# Patient Record
Sex: Female | Born: 1940 | Race: White | Hispanic: No | Smoking: Former smoker
Health system: Southern US, Community
[De-identification: ages and names within clinical notes are randomized; demographics above are authoritative.]

## PROBLEM LIST (undated history)

## (undated) DIAGNOSIS — M1990 Unspecified osteoarthritis, unspecified site: Secondary | ICD-10-CM

## (undated) DIAGNOSIS — F32A Depression, unspecified: Secondary | ICD-10-CM

## (undated) DIAGNOSIS — Z973 Presence of spectacles and contact lenses: Secondary | ICD-10-CM

## (undated) DIAGNOSIS — F419 Anxiety disorder, unspecified: Secondary | ICD-10-CM

## (undated) DIAGNOSIS — M51379 Other intervertebral disc degeneration, lumbosacral region without mention of lumbar back pain or lower extremity pain: Secondary | ICD-10-CM

## (undated) DIAGNOSIS — C4490 Unspecified malignant neoplasm of skin, unspecified: Secondary | ICD-10-CM

## (undated) DIAGNOSIS — M8580 Other specified disorders of bone density and structure, unspecified site: Secondary | ICD-10-CM

## (undated) DIAGNOSIS — G8929 Other chronic pain: Secondary | ICD-10-CM

## (undated) DIAGNOSIS — F329 Major depressive disorder, single episode, unspecified: Secondary | ICD-10-CM

## (undated) DIAGNOSIS — T4145XA Adverse effect of unspecified anesthetic, initial encounter: Secondary | ICD-10-CM

## (undated) DIAGNOSIS — Z974 Presence of external hearing-aid: Secondary | ICD-10-CM

## (undated) DIAGNOSIS — K219 Gastro-esophageal reflux disease without esophagitis: Secondary | ICD-10-CM

## (undated) DIAGNOSIS — M545 Low back pain, unspecified: Secondary | ICD-10-CM

## (undated) DIAGNOSIS — D071 Carcinoma in situ of vulva: Secondary | ICD-10-CM

## (undated) DIAGNOSIS — I10 Essential (primary) hypertension: Secondary | ICD-10-CM

## (undated) DIAGNOSIS — T8859XA Other complications of anesthesia, initial encounter: Secondary | ICD-10-CM

## (undated) DIAGNOSIS — Z85828 Personal history of other malignant neoplasm of skin: Secondary | ICD-10-CM

## (undated) DIAGNOSIS — M5137 Other intervertebral disc degeneration, lumbosacral region: Secondary | ICD-10-CM

## (undated) DIAGNOSIS — M1611 Unilateral primary osteoarthritis, right hip: Secondary | ICD-10-CM

## (undated) DIAGNOSIS — M549 Dorsalgia, unspecified: Secondary | ICD-10-CM

## (undated) DIAGNOSIS — Z87442 Personal history of urinary calculi: Secondary | ICD-10-CM

## (undated) HISTORY — DX: Unspecified malignant neoplasm of skin, unspecified: C44.90

## (undated) HISTORY — DX: Carcinoma in situ of vulva: D07.1

## (undated) HISTORY — DX: Anxiety disorder, unspecified: F41.9

## (undated) HISTORY — PX: BREAST LUMPECTOMY: SHX2

## (undated) HISTORY — DX: Major depressive disorder, single episode, unspecified: F32.9

## (undated) HISTORY — DX: Unspecified osteoarthritis, unspecified site: M19.90

## (undated) HISTORY — DX: Unilateral primary osteoarthritis, right hip: M16.11

## (undated) HISTORY — DX: Depression, unspecified: F32.A

---

## 1959-10-31 HISTORY — PX: BREAST LUMPECTOMY: SHX2

## 1959-10-31 HISTORY — PX: BREAST EXCISIONAL BIOPSY: SUR124

## 1986-10-30 DIAGNOSIS — Z87442 Personal history of urinary calculi: Secondary | ICD-10-CM

## 1986-10-30 HISTORY — DX: Personal history of urinary calculi: Z87.442

## 1988-10-30 DIAGNOSIS — C4490 Unspecified malignant neoplasm of skin, unspecified: Secondary | ICD-10-CM

## 1988-10-30 HISTORY — DX: Unspecified malignant neoplasm of skin, unspecified: C44.90

## 1997-12-17 ENCOUNTER — Ambulatory Visit: Admission: RE | Admit: 1997-12-17 | Discharge: 1997-12-17 | Payer: Self-pay | Admitting: Internal Medicine

## 1998-08-13 ENCOUNTER — Ambulatory Visit (HOSPITAL_COMMUNITY): Admission: RE | Admit: 1998-08-13 | Discharge: 1998-08-13 | Payer: Self-pay | Admitting: Obstetrics and Gynecology

## 1999-12-26 ENCOUNTER — Ambulatory Visit (HOSPITAL_COMMUNITY): Admission: RE | Admit: 1999-12-26 | Discharge: 1999-12-26 | Payer: Self-pay | Admitting: Obstetrics and Gynecology

## 1999-12-26 ENCOUNTER — Encounter: Payer: Self-pay | Admitting: Obstetrics and Gynecology

## 1999-12-29 ENCOUNTER — Ambulatory Visit (HOSPITAL_COMMUNITY): Admission: RE | Admit: 1999-12-29 | Discharge: 1999-12-29 | Payer: Self-pay | Admitting: Obstetrics and Gynecology

## 1999-12-29 ENCOUNTER — Encounter: Payer: Self-pay | Admitting: Obstetrics and Gynecology

## 2001-02-15 ENCOUNTER — Encounter: Payer: Self-pay | Admitting: Obstetrics and Gynecology

## 2001-02-15 ENCOUNTER — Ambulatory Visit (HOSPITAL_COMMUNITY): Admission: RE | Admit: 2001-02-15 | Discharge: 2001-02-15 | Payer: Self-pay | Admitting: Obstetrics and Gynecology

## 2002-02-19 ENCOUNTER — Encounter: Payer: Self-pay | Admitting: Obstetrics and Gynecology

## 2002-02-19 ENCOUNTER — Ambulatory Visit (HOSPITAL_COMMUNITY): Admission: RE | Admit: 2002-02-19 | Discharge: 2002-02-19 | Payer: Self-pay | Admitting: Obstetrics and Gynecology

## 2003-05-26 ENCOUNTER — Ambulatory Visit (HOSPITAL_COMMUNITY): Admission: RE | Admit: 2003-05-26 | Discharge: 2003-05-26 | Payer: Self-pay | Admitting: Obstetrics and Gynecology

## 2003-05-26 ENCOUNTER — Encounter: Payer: Self-pay | Admitting: Obstetrics and Gynecology

## 2004-06-30 ENCOUNTER — Ambulatory Visit (HOSPITAL_COMMUNITY): Admission: RE | Admit: 2004-06-30 | Discharge: 2004-06-30 | Payer: Self-pay | Admitting: Obstetrics and Gynecology

## 2004-07-07 ENCOUNTER — Encounter: Admission: RE | Admit: 2004-07-07 | Discharge: 2004-07-07 | Payer: Self-pay | Admitting: Obstetrics and Gynecology

## 2004-07-13 ENCOUNTER — Encounter: Admission: RE | Admit: 2004-07-13 | Discharge: 2004-07-13 | Payer: Self-pay | Admitting: General Surgery

## 2004-11-03 ENCOUNTER — Ambulatory Visit: Payer: Self-pay | Admitting: Family Medicine

## 2005-01-26 ENCOUNTER — Ambulatory Visit (HOSPITAL_COMMUNITY): Admission: RE | Admit: 2005-01-26 | Discharge: 2005-01-26 | Payer: Self-pay | Admitting: Gastroenterology

## 2005-01-26 ENCOUNTER — Encounter (INDEPENDENT_AMBULATORY_CARE_PROVIDER_SITE_OTHER): Payer: Self-pay | Admitting: Specialist

## 2005-08-18 ENCOUNTER — Encounter: Admission: RE | Admit: 2005-08-18 | Discharge: 2005-08-18 | Payer: Self-pay | Admitting: Obstetrics and Gynecology

## 2005-09-29 ENCOUNTER — Ambulatory Visit: Payer: Self-pay | Admitting: Family Medicine

## 2005-10-06 ENCOUNTER — Ambulatory Visit: Payer: Self-pay | Admitting: Family Medicine

## 2006-08-23 ENCOUNTER — Encounter: Admission: RE | Admit: 2006-08-23 | Discharge: 2006-08-23 | Payer: Self-pay | Admitting: Obstetrics and Gynecology

## 2006-09-14 ENCOUNTER — Ambulatory Visit: Payer: Self-pay | Admitting: Internal Medicine

## 2006-10-15 ENCOUNTER — Ambulatory Visit: Payer: Self-pay | Admitting: Family Medicine

## 2006-10-15 LAB — CONVERTED CEMR LAB
Alkaline Phosphatase: 46 units/L (ref 39–117)
BUN: 12 mg/dL (ref 6–23)
Basophils Absolute: 0 10*3/uL (ref 0.0–0.1)
Cholesterol: 204 mg/dL (ref 0–200)
HCT: 39.3 % (ref 36.0–46.0)
HDL: 60.2 mg/dL (ref >39.0)
LDL DIRECT: 136.8 mg/dL
MCHC: 33.5 g/dL (ref 30.0–36.0)
MCV: 94.2 fL (ref 78.0–100.0)
Neutrophils Relative %: 48.9 % (ref 43.0–77.0)
RBC: 4.17 M/uL (ref 3.87–5.11)
TSH: 2.83 microintl units/mL (ref 0.35–5.50)
Triglyceride fasting, serum: 57 mg/dL (ref 0–149)

## 2006-10-16 ENCOUNTER — Ambulatory Visit: Payer: Self-pay | Admitting: Internal Medicine

## 2006-11-07 ENCOUNTER — Encounter: Admission: RE | Admit: 2006-11-07 | Discharge: 2006-11-07 | Payer: Self-pay | Admitting: Family Medicine

## 2007-02-18 ENCOUNTER — Ambulatory Visit: Payer: Self-pay | Admitting: Family Medicine

## 2007-02-18 ENCOUNTER — Encounter: Admission: RE | Admit: 2007-02-18 | Discharge: 2007-02-18 | Payer: Self-pay | Admitting: Family Medicine

## 2007-02-21 ENCOUNTER — Ambulatory Visit: Payer: Self-pay | Admitting: Family Medicine

## 2007-07-24 ENCOUNTER — Telehealth (INDEPENDENT_AMBULATORY_CARE_PROVIDER_SITE_OTHER): Payer: Self-pay | Admitting: *Deleted

## 2007-08-16 ENCOUNTER — Ambulatory Visit: Payer: Self-pay | Admitting: Family Medicine

## 2007-08-16 DIAGNOSIS — M1991 Primary osteoarthritis, unspecified site: Secondary | ICD-10-CM | POA: Insufficient documentation

## 2007-08-28 ENCOUNTER — Encounter: Admission: RE | Admit: 2007-08-28 | Discharge: 2007-08-28 | Payer: Self-pay | Admitting: Obstetrics and Gynecology

## 2007-10-29 ENCOUNTER — Ambulatory Visit: Payer: Self-pay | Admitting: Family Medicine

## 2007-10-29 DIAGNOSIS — F341 Dysthymic disorder: Secondary | ICD-10-CM | POA: Insufficient documentation

## 2007-10-29 DIAGNOSIS — L578 Other skin changes due to chronic exposure to nonionizing radiation: Secondary | ICD-10-CM | POA: Insufficient documentation

## 2008-08-28 ENCOUNTER — Encounter: Admission: RE | Admit: 2008-08-28 | Discharge: 2008-08-28 | Payer: Self-pay | Admitting: Obstetrics and Gynecology

## 2008-09-09 ENCOUNTER — Encounter: Admission: RE | Admit: 2008-09-09 | Discharge: 2008-09-09 | Payer: Self-pay | Admitting: Family Medicine

## 2008-10-30 HISTORY — PX: APPENDECTOMY: SHX54

## 2008-11-11 ENCOUNTER — Ambulatory Visit: Payer: Self-pay | Admitting: Internal Medicine

## 2008-11-11 ENCOUNTER — Encounter: Payer: Self-pay | Admitting: Family Medicine

## 2008-11-17 ENCOUNTER — Ambulatory Visit: Payer: Self-pay | Admitting: Family Medicine

## 2008-11-18 ENCOUNTER — Encounter: Payer: Self-pay | Admitting: *Deleted

## 2008-12-15 ENCOUNTER — Encounter (INDEPENDENT_AMBULATORY_CARE_PROVIDER_SITE_OTHER): Payer: Self-pay | Admitting: Surgery

## 2008-12-15 ENCOUNTER — Telehealth: Payer: Self-pay | Admitting: Family Medicine

## 2008-12-15 ENCOUNTER — Inpatient Hospital Stay (HOSPITAL_COMMUNITY): Admission: EM | Admit: 2008-12-15 | Discharge: 2008-12-22 | Payer: Self-pay | Admitting: Emergency Medicine

## 2008-12-15 ENCOUNTER — Ambulatory Visit: Payer: Self-pay | Admitting: Family Medicine

## 2008-12-15 DIAGNOSIS — Z87442 Personal history of urinary calculi: Secondary | ICD-10-CM | POA: Insufficient documentation

## 2008-12-15 DIAGNOSIS — R109 Unspecified abdominal pain: Secondary | ICD-10-CM | POA: Insufficient documentation

## 2008-12-15 DIAGNOSIS — N200 Calculus of kidney: Secondary | ICD-10-CM | POA: Insufficient documentation

## 2008-12-15 HISTORY — PX: LAPAROSCOPIC ABDOMINAL EXPLORATION: SHX6249

## 2008-12-15 LAB — CONVERTED CEMR LAB
Glucose, Urine, Semiquant: NEGATIVE
Ketones, urine, test strip: NEGATIVE
Nitrite: NEGATIVE
Specific Gravity, Urine: 1.015
pH: 6.5

## 2009-08-31 ENCOUNTER — Encounter: Admission: RE | Admit: 2009-08-31 | Discharge: 2009-08-31 | Payer: Self-pay | Admitting: Obstetrics and Gynecology

## 2010-09-01 ENCOUNTER — Encounter: Admission: RE | Admit: 2010-09-01 | Discharge: 2010-09-01 | Payer: Self-pay | Admitting: Geriatric Medicine

## 2010-11-20 ENCOUNTER — Encounter: Payer: Self-pay | Admitting: Obstetrics and Gynecology

## 2010-11-27 LAB — CONVERTED CEMR LAB
ALT: 20 units/L (ref 0–35)
Alkaline Phosphatase: 41 units/L (ref 39–117)
BUN: 12 mg/dL (ref 6–23)
Basophils Absolute: 0 10*3/uL (ref 0.0–0.1)
Basophils Relative: 0.7 % (ref 0.0–3.0)
Bilirubin Urine: NEGATIVE
Bilirubin Urine: NEGATIVE
Bilirubin, Direct: 0.1 mg/dL (ref 0.0–0.3)
Bilirubin, Direct: 0.2 mg/dL (ref 0.0–0.3)
Calcium: 8.9 mg/dL (ref 8.4–10.5)
Calcium: 9.1 mg/dL (ref 8.4–10.5)
Cholesterol: 217 mg/dL (ref 0–200)
Cholesterol: 240 mg/dL (ref 0–200)
Creatinine, Ser: 0.8 mg/dL (ref 0.4–1.2)
Direct LDL: 140.8 mg/dL
Eosinophils Absolute: 0.2 10*3/uL (ref 0.0–0.7)
Eosinophils Absolute: 0.3 10*3/uL (ref 0.0–0.6)
GFR calc Af Amer: 107 mL/min
GFR calc Af Amer: 91 mL/min
GFR calc non Af Amer: 76 mL/min
GFR calc non Af Amer: 88 mL/min
Glucose, Urine, Semiquant: NEGATIVE
HCT: 37.7 % (ref 36.0–46.0)
HDL: 58.7 mg/dL (ref >39.0)
Hemoglobin: 12.7 g/dL (ref 12.0–15.0)
Ketones, urine, test strip: NEGATIVE
Lymphocytes Relative: 38.2 % (ref 12.0–46.0)
Lymphocytes Relative: 40.8 % (ref 12.0–46.0)
MCHC: 33.8 g/dL (ref 30.0–36.0)
MCHC: 34.5 g/dL (ref 30.0–36.0)
MCV: 93.2 fL (ref 78.0–100.0)
MCV: 94.6 fL (ref 78.0–100.0)
Monocytes Absolute: 0.4 10*3/uL (ref 0.1–1.0)
Monocytes Relative: 6.6 % (ref 3.0–11.0)
Neutro Abs: 2.7 10*3/uL (ref 1.4–7.7)
Neutro Abs: 3 10*3/uL (ref 1.4–7.7)
Platelets: 199 10*3/uL (ref 150–400)
Protein, U semiquant: NEGATIVE
RDW: 12.9 % (ref 11.5–14.6)
Sodium: 142 meq/L (ref 135–145)
Specific Gravity, Urine: 1.01
Specific Gravity, Urine: 1.015
TSH: 2.09 microintl units/mL (ref 0.35–5.50)
TSH: 2.2 microintl units/mL (ref 0.35–5.50)
Total Bilirubin: 0.9 mg/dL (ref 0.3–1.2)
Urobilinogen, UA: 0.2
VLDL: 12 mg/dL (ref 0–40)
pH: 7.5
pH: 8

## 2011-02-14 LAB — CBC
HCT: 33.8 % — ABNORMAL LOW (ref 36.0–46.0)
HCT: 39.4 % (ref 36.0–46.0)
Hemoglobin: 11.4 g/dL — ABNORMAL LOW (ref 12.0–15.0)
Hemoglobin: 11.7 g/dL — ABNORMAL LOW (ref 12.0–15.0)
Hemoglobin: 13.1 g/dL (ref 12.0–15.0)
Platelets: 159 10*3/uL (ref 150–400)
RBC: 3.6 MIL/uL — ABNORMAL LOW (ref 3.87–5.11)
RBC: 4.21 MIL/uL (ref 3.87–5.11)
RDW: 13.5 % (ref 11.5–15.5)
WBC: 7.3 10*3/uL (ref 4.0–10.5)

## 2011-02-14 LAB — URINALYSIS, ROUTINE W REFLEX MICROSCOPIC
Hgb urine dipstick: NEGATIVE
Nitrite: NEGATIVE
Protein, ur: 30 mg/dL — AB
Specific Gravity, Urine: 1.025 (ref 1.005–1.030)
Urobilinogen, UA: 1 mg/dL (ref 0.0–1.0)

## 2011-02-14 LAB — BASIC METABOLIC PANEL
BUN: 9 mg/dL (ref 6–23)
Calcium: 8 mg/dL — ABNORMAL LOW (ref 8.4–10.5)
GFR calc non Af Amer: >60 mL/min (ref >60)
GFR calc non Af Amer: >60 mL/min (ref >60)
Glucose, Bld: 131 mg/dL — ABNORMAL HIGH (ref 70–99)
Potassium: 4.4 mEq/L (ref 3.5–5.1)
Sodium: 134 mEq/L — ABNORMAL LOW (ref 135–145)
Sodium: 139 mEq/L (ref 135–145)

## 2011-02-14 LAB — COMPREHENSIVE METABOLIC PANEL
ALT: 13 U/L (ref 0–35)
Alkaline Phosphatase: 53 U/L (ref 39–117)
BUN: 10 mg/dL (ref 6–23)
CO2: 26 mEq/L (ref 19–32)
Chloride: 100 mEq/L (ref 96–112)
GFR calc non Af Amer: >60 mL/min (ref >60)
Glucose, Bld: 155 mg/dL — ABNORMAL HIGH (ref 70–99)
Potassium: 3.7 mEq/L (ref 3.5–5.1)
Sodium: 136 mEq/L (ref 135–145)
Total Bilirubin: 1.2 mg/dL (ref 0.3–1.2)

## 2011-02-14 LAB — POCT I-STAT, CHEM 8
Creatinine, Ser: 0.8 mg/dL (ref 0.4–1.2)
HCT: 42 % (ref 36.0–46.0)
Hemoglobin: 14.3 g/dL (ref 12.0–15.0)
Sodium: 136 mEq/L (ref 135–145)
TCO2: 22 mmol/L (ref 0–100)

## 2011-02-14 LAB — URINE MICROSCOPIC-ADD ON

## 2011-02-14 LAB — DIFFERENTIAL
Basophils Absolute: 0.2 10*3/uL — ABNORMAL HIGH (ref 0.0–0.1)
Basophils Relative: 2 % — ABNORMAL HIGH (ref 0–1)
Eosinophils Absolute: 0 10*3/uL (ref 0.0–0.7)
Monocytes Relative: 4 % (ref 3–12)
Neutro Abs: 10.9 10*3/uL — ABNORMAL HIGH (ref 1.7–7.7)
Neutrophils Relative %: 90 % — ABNORMAL HIGH (ref 43–77)

## 2011-02-14 LAB — WOUND CULTURE

## 2011-02-14 LAB — ANAEROBIC CULTURE

## 2011-02-14 LAB — LIPASE, BLOOD: Lipase: 15 U/L (ref 11–59)

## 2011-03-14 NOTE — H&P (Signed)
NAMEJANETTA, [name]                 ACCOUNT NO.:  [id number]   MEDICAL RECORD NO.:  [id number]          PATIENT TYPE:  EMS   LOCATION:  ED                           FACILITY:  [facility]   PHYSICIAN:  Velora Heckler, MD      DATE OF BIRTH:  07/14/1941   DATE OF ADMISSION:  12/15/2008  DATE OF DISCHARGE:                              HISTORY & PHYSICAL   REFERRING PHYSICIAN:  Dr. Cheri Guppy.   PRIMARY CARE PHYSICIAN:  Dr. Alonza Smoker, [facility].   CHIEF COMPLAINT:  Abdominal pain, nausea, vomiting.   HISTORY OF PRESENT ILLNESS:  The patient is a 70 year old white female  from [place], [place].  She presents to the emergency  department with a 24-hour history of abdominal pain.  Today she  developed nausea, vomiting and chills.  The patient was seen at her  primary physician's office and diagnosed with kidney stone.  The patient  went home after being given pain medication.  Nausea and vomiting  occurred and the patient was brought by friends to the emergency  department at [facility] for evaluation.  Laboratory  studies showed an elevated white blood cell count of 12,000 with a left  shift.  CT scan of the abdomen and pelvis was obtained and demonstrated  an acute inflammatory process in the right lower quadrant and into the  pelvis.  There was a moderate amount of free fluid.  There were air  bubbles consistent with probable perforated acute appendicitis with  peritonitis.  General surgery is called for management.   PAST MEDICAL HISTORY:  History of nephrolithiasis, history of  degenerative joint disease.   MEDICATIONS:  Prempro and multivitamins.   ALLERGIES:  DEMEROL causing nausea and vomiting.   FAMILY HISTORY:  Noncontributory.  No adverse events with anesthesia  known.   SOCIAL HISTORY:  The patient lives alone in [place].  She is  accompanied by two friends.  She quit smoking in 1987.  She drinks wine  daily.   REVIEW OF  SYSTEMS:  A 15-system review otherwise without significant  findings.   EXAM:  CONSTITUTIONAL:  A 70 year old, bright alert white female on a  stretcher in the emergency department.  VITAL SIGNS:  Temperature 98.7, pulse 80, respirations 18, blood  pressure 99/59.  Palpation of the skin, however, shows her to be  febrile.  HEENT:  Shows her to be normocephalic, atraumatic.  Sclerae clear.  Conjunctiva clear.  Dentition fair.  Mucous membranes moist.  NECK:  Palpation of the neck shows no thyroid nodularity.  No  lymphadenopathy.  No mass.  No tenderness.  LUNGS:  Clear to auscultation bilaterally.  CARDIAC:  Exam shows regular rate and rhythm without significant murmur.  Peripheral pulses are full.  EXTREMITIES:  Nontender without significant edema.  ABDOMEN:  Soft without distention.  Few bowel sounds were present on  auscultation.  There is a small incarcerated umbilical hernia which is  asymptomatic.  There are no other surgical wounds.  Palpation reveals an  area of mass in the right lower quadrant.  There is  voluntary  guarding.  There is rebound tenderness.  This area is exquisitely tender  to palpation.  No hepatosplenomegaly.  No evidence of hernia.  NEUROLOGICALLY:  The patient is alert and oriented without focal  deficit.  There is no sign of tremor.   LABORATORY STUDIES:  White count 12.0, hemoglobin 13.1, platelets  181,000.  Differential shows 90% segmented neutrophils.  Electrolytes  are normal.  Urinalysis is benign.   RADIOGRAPHIC STUDIES:  CT scan of the abdomen and pelvis is reviewed.  This demonstrates an acute inflammatory process in the right lower  quadrant of the abdomen and the right side of the pelvis.  There is a  moderate amount of fluid.  There are some air bubbles.  This is felt by  the radiologist to be most consistent with acute perforated appendicitis  versus Crohn's disease.   IMPRESSION:  Acute appendicitis with perforation and generalized   peritonitis.   PLAN:  The patient will be admitted on the general surgical service.  Intravenous antibiotics have been initiated by the emergency room  physician.  The patient will be prepared and taken to the operating room  this evening for laparoscopy, possible laparoscopic appendectomy, and  possible laparotomy.  Risk and benefits of the procedure are discussed  with the patient and her friends.  I explained that the inflammatory  process looks rather extensive on CT scan and may require an open  surgical procedure.  I also discussed with them the expected hospital  stay of 5 to 7 days for intravenous antibiotics.  I also explained to  them the possibility of recurrent infection in 7 to 14 days due to  perforation and generalized peritonitis with the possibility of delayed  abscess formation.  They understand and agree to proceed to the  operating room this evening.  I will make arrangements with the  operating room staff.      Velora Heckler, MD  Electronically Signed     TMG/MEDQ  D:  12/15/2008  T:  12/15/2008  Job:  [id number]   cc:   Tinnie Gens A. Tawanna Cooler, MD  [address] [place]  [place] [address]   Hassan Buckler. Weldon Inches, MD  Fax: [phone]

## 2011-03-14 NOTE — Op Note (Signed)
[name], [name]                 ACCOUNT NO.:  [id number]   MEDICAL RECORD NO.:  [id number]          PATIENT TYPE:  INP   LOCATION:  1235                         FACILITY:  [facility]   PHYSICIAN:  Velora Heckler, MD      DATE OF BIRTH:  1941/04/23   DATE OF PROCEDURE:  12/15/2008  DATE OF DISCHARGE:                               OPERATIVE REPORT   PREOPERATIVE DIAGNOSIS:  Perforated acute appendicitis.   POSTOPERATIVE DIAGNOSES:  Perforated acute appendicitis.   PROCEDURE:  1. Diagnostic laparoscopy.  2. Exploratory laparotomy.  3. Open appendectomy.   SURGEON  Velora Heckler, MD, FACS   ANESTHESIA:  General per Dr. Ronelle Nigh.   ESTIMATED BLOOD LOSS:  Minimal.   PREPARATION:  Betadine.   COMPLICATIONS:  None.   INDICATIONS:  The patient is a 70 year old white female who presented to  the emergency department with 24-hour history of lower abdominal pain,  nausea, vomiting and chills.  White blood cell count was elevated at  12,000.  CT scan abdomen and pelvis demonstrated findings consistent  with acute appendicitis.  General surgery was consulted.  The patient  was admitted on the general surgical service and prepared for the  operating room.   DESCRIPTION OF PROCEDURE:  The procedure was done in OR #1 at [facility].  The patient is brought to the operating room,  placed in supine position on the operating room table.  Following  administration of general anesthesia the patient is prepped and draped  in usual strict aseptic fashion.  After ascertaining that an adequate  level of anesthesia had been achieved, a supraumbilical incision is made  in the midline.  Dissection was carried down to the fascia.  Fascia was  incised in the midline.  The peritoneal cavity is entered cautiously.  Zero Vicryl pursestring suture was placed in the fascia.  An Hasson  cannula was introduced under direct vision and secured with a  pursestring suture.  Abdomen was  insufflated with carbon dioxide.  Laparoscope was introduced and the abdomen explored.  There is cloudy  brown green purulent fluid throughout the peritoneal cavity,  particularly in the pelvis.  There are acute inflammatory changes in the  right lower quadrant.  There were multiple loops of small bowel adherent  to the cecum and the abdominal wall.  An operating port was placed in  the right upper quadrant with a 5 mm trocar.  A Glassman clamp was used  to mobilize the bowel away from the inflammatory process.  Air bubbles  were seen rising through the purulent fluid.  Appendix was not able to  be visualized.  Decision was made at this point to convert to open  procedure.   The field is reset.  Lower midline incision is made with a #15 blade  extending from just above the pubis to the supraumbilical incision above  the umbilicus.  Dissection is carried through subcutaneous tissues with  electrocautery.  Fascia is incised in the midline and the peritoneal  cavity is entered cautiously.  Purulent fluid is evacuated from the  abdomen with a pool sucker.  Balfour retractors placed for exposure.  With gentle mobilization of the cecum, the cecum was mobilized along its  lateral peritoneal attachments with the harmonic scalpel and the  electrocautery.  The appendix is adherent to the base of the terminal  ileum mesentery.  Appendix was bluntly dissected free from the  mesentery.  Mesoappendix was divided with the harmonic scalpel down to  the base of the appendix.  Base of the appendix was relatively normal at  its junction with the cecal wall.  The base of the appendix is closed  with a TA-30 stapler and the appendix was excised and submitted to  pathology for review.  Aerobic and anaerobic cultures were obtained.  Abdomen was copiously irrigated with warm saline which was evacuated  until clear.  Good hemostasis was noted.  Viscera returned to their  normal locations and covered with  omentum.  The midline surgical wound  is closed with interrupted #1 Vicryl interrupted sutures.  Subcutaneous  tissues irrigated.  Skin incisions were closed with stainless steel  staples.  Sterile dressings were applied.  The patient is awakened from  anesthesia and brought to the recovery room in stable condition.  The  patient tolerated the procedure well.      Velora Heckler, MD  Electronically Signed     TMG/MEDQ  D:  12/15/2008  T:  12/16/2008  Job:  [id number]   cc:   Tinnie Gens A. Tawanna Cooler, MD  [address] [place]  [place] [address]   Hassan Buckler. Weldon Inches, MD  Fax: [phone]

## 2011-03-14 NOTE — Discharge Summary (Signed)
[name], [name]                 ACCOUNT NO.:  [id number]   MEDICAL RECORD NO.:  [id number]          PATIENT TYPE:  INP   LOCATION:  1535                         FACILITY:  [facility]   PHYSICIAN:  Joy Heckler, MD      DATE OF BIRTH:  12/09/1940   DATE OF ADMISSION:  12/15/2008  DATE OF DISCHARGE:  12/22/2008                               DISCHARGE SUMMARY   REASON FOR ADMISSION:  Perforated appendicitis.   HISTORY OF PRESENT ILLNESS:  The patient is a 70 year old white female  from [place], [place].  She presented to the emergency  department with a 24-hour history of abdominal pain, nausea, vomiting  and chills.  Evaluation included laboratory studies showing a white  blood cell count of 12,000.  CT scan of the abdomen and pelvis showed  moderate free fluid and inflammatory changes around the appendix  consistent with perforated appendicitis.   HOSPITAL COURSE:  The patient was seen and evaluated in the emergency  department.  She was admitted to the general surgical service.  She was  prepared immediately and brought to the operating room where she  underwent diagnostic laparoscopy and open appendectomy.  Postoperatively, she received intravenous antibiotics.  She had gradual  resolution of her ileus.  Postoperative course was complicated with  development of a subcutaneous wound infection which required removal of  a portion of the skin closure and open packing of the wound with  dressing changes.  The patient received 7 days of intravenous  antibiotics with Invanz.  She she is prepared for discharge home on the  seventh postoperative day.   DISCHARGE/PLAN:  The patient is discharged home today December 22, 2008,  in good condition, tolerating a regular diet, ambulating independently.  The patient will be seen back in my office at [facility]  in 3 days for wound check.  [facility] nurses will  assist with twice-daily dressing  changes.   DISCHARGE MEDICATIONS:  1. Vicodin for pain.  2. Augmentin 875 mg b.i.d. for one more week.   FINAL DIAGNOSES:  1. Perforated acute appendicitis.  2. Generalized peritonitis.  3. Wound infection.   CONDITION AT DISCHARGE:  Improved.      Joy Heckler, MD  Electronically Signed     TMG/MEDQ  D:  12/22/2008  T:  12/22/2008  Job:  [id number]   cc:   Joy Gens A. Tawanna Cooler, MD  [address] [place]  [place] [address]

## 2011-03-17 NOTE — Op Note (Signed)
NAMENEIDRA, GIRVAN                 ACCOUNT NO.:  [id number]   MEDICAL RECORD NO.:  [id number]          PATIENT TYPE:  AMB   LOCATION:  ENDO                         FACILITY:  [facility]   PHYSICIAN:  Danise Edge, M.D.   DATE OF BIRTH:  03-22-1941   DATE OF PROCEDURE:  01/26/2005  DATE OF DISCHARGE:                                 OPERATIVE REPORT   PROCEDURE:  Colonoscopy and polypectomy.   INDICATIONS FOR PROCEDURE:  Ms. Fredrika Canby is a 70 year old female born  June 06, 1941.  Ms. Dumais underwent a colonoscopy approximately 12 years  ago.  She is scheduled to undergo a screening colonoscopy with polypectomy  to prevent colon cancer.   ENDOSCOPIST:  Danise Edge, M.D.   PREMEDICATION:  Versed 7.5 mg, Demerol 60 mg.   DESCRIPTION OF PROCEDURE:  After obtaining informed consent, Ms. Caywood was  placed in the left lateral decubitus position.  I administered intravenous  Demerol and intravenous Versed to achieve conscious sedation for the  procedure.  The patient's blood pressure, oxygen saturation, and cardiac  rhythm were monitored throughout the procedure and documented in the medical  record.   Anal inspection and digital rectal exam were normal.  The Olympus adjustable  pediatric colonoscope was introduced into the rectum and advanced to the  cecum.  The colonic preparation for the exam today was excellent.   Rectum:  Normal.   Sigmoid colon and descending colon:  Normal.   Splenic flexure:  Normal.   Transverse colon:  Normal.   Hepatic flexure:  Normal.   Ascending colon:  From the proximal ascending colon, a 2-mm sessile polyp  was removed with the electrocautery snare.   Cecum and ileocecal valve:  Normal.   ASSESSMENT:  A small polyp was removed from the proximal ascending colon;  otherwise, Ms. Burridge's proctocolonoscopy to the cecum was normal.      MJ/MEDQ  D:  01/26/2005  T:  01/26/2005  Job:  [id number]   cc:   Tinnie Gens A. Tawanna Cooler, M.D. [facility]

## 2011-07-25 ENCOUNTER — Encounter: Payer: Self-pay | Admitting: Gynecology

## 2011-07-25 ENCOUNTER — Ambulatory Visit (INDEPENDENT_AMBULATORY_CARE_PROVIDER_SITE_OTHER): Payer: Medicare Other | Admitting: Gynecology

## 2011-07-25 VITALS — BP 120/76 | Ht 66.0 in | Wt 152.0 lb

## 2011-07-25 DIAGNOSIS — C801 Malignant (primary) neoplasm, unspecified: Secondary | ICD-10-CM | POA: Insufficient documentation

## 2011-07-25 DIAGNOSIS — F32A Depression, unspecified: Secondary | ICD-10-CM | POA: Insufficient documentation

## 2011-07-25 DIAGNOSIS — F419 Anxiety disorder, unspecified: Secondary | ICD-10-CM | POA: Insufficient documentation

## 2011-07-25 DIAGNOSIS — Z79890 Hormone replacement therapy (postmenopausal): Secondary | ICD-10-CM

## 2011-07-25 DIAGNOSIS — F329 Major depressive disorder, single episode, unspecified: Secondary | ICD-10-CM | POA: Insufficient documentation

## 2011-07-25 DIAGNOSIS — M1990 Unspecified osteoarthritis, unspecified site: Secondary | ICD-10-CM | POA: Insufficient documentation

## 2011-07-25 DIAGNOSIS — N952 Postmenopausal atrophic vaginitis: Secondary | ICD-10-CM

## 2011-07-25 DIAGNOSIS — N951 Menopausal and female climacteric states: Secondary | ICD-10-CM

## 2011-07-25 MED ORDER — CONJ ESTROG-MEDROXYPROGEST ACE 0.45-1.5 MG PO TABS: 1.0000 | ORAL_TABLET | Freq: Every day | ORAL | Status: DC

## 2011-07-25 NOTE — Progress Notes (Signed)
Sheniya Garciaperez [facility] [birth year] [id number]        70 y.o.  for followup.  Former patient of Dr. Leota Sauers. She is on HRT of Prempro 0.45-1.5 doing well with this. She states that she has tried weaning off of it but has a general feeling of well-being when she is on it.  Past medical history,surgical history, medications, allergies, family history and social history were all reviewed and documented in the EPIC chart. ROS:  Was performed and pertinent positives and negatives are included in the history.  Exam: chaperone present Filed Vitals:   07/25/11 0900  BP: 120/76   General appearance  Normal Skin grossly normal Head/Neck normal with no cervical or supraclavicular adenopathy thyroid normal Lungs  clear Cardiac RR, without RMG Abdominal  soft, nontender, without masses, organomegaly or hernia Breasts  examined lying and sitting without masses, retractions, discharge or axillary adenopathy. Pelvic  Ext/BUS/vagina  normal atrophic genital changes noted  Cervix  normal  Pap not done  Uterus  anteverted, normal size, shape and contour, midline and mobile nontender   Adnexa  Without masses or tenderness    Anus and perineum  normal   Rectovaginal  normal sphincter tone without palpated masses or tenderness.    Assessment/Plan:  70 y.o. female for annual exam.    1. HRT. Patient has been on HRT for a number of years. In fact she was part of the WHI study group. She initially tried transdermal but had a lot of skin irritation. I reviewed the issues of HRT. I discussed the WHI study, increased risk of stroke heart attack DVT and breast cancer, ACOG and NAMS recommendations for lowest dose for shortest period of time, transdermal benefits as far as first pass effect decreased thrombosis risk and the issues of synthetic hormones versus natural hormones all reviewed. At this point the patient prefers to continue on Prempro 0. 45-1.5. She understands the risks accepts them and I refilled her times a  year. She is thinking of switching to estradiol and Prometrium but she will let me know after she reads more about it and she wants to do so. 2. Health maintenance. Patient sees a primary for annual general health. No blood work was done today. She is due for mammography in November I reminded her to schedule this. Self breast exams on a monthly basis was discussed. No Pap smear was done today. She has no history of significant abnormal Pap smears and has had annual regular Pap smears over the past several years. I reviewed current screening guidelines and she is 74 and she agrees with stopping Pap smears at this point.  She had a colonoscopy and a bone density last year per her history and will continue to follow up through her primary for these screening procedures. Assuming she continues well from a gynecologic standpoint she will see [place] in a year sooner as needed    Dara Lords MD, 10:19 AM 07/25/2011

## 2011-07-25 NOTE — Patient Instructions (Signed)
Per our discussion.

## 2011-08-07 ENCOUNTER — Other Ambulatory Visit: Payer: Self-pay | Admitting: Geriatric Medicine

## 2011-08-07 DIAGNOSIS — Z1231 Encounter for screening mammogram for malignant neoplasm of breast: Secondary | ICD-10-CM

## 2011-08-24 ENCOUNTER — Other Ambulatory Visit: Payer: Self-pay | Admitting: Dermatology

## 2011-09-13 ENCOUNTER — Ambulatory Visit
Admission: RE | Admit: 2011-09-13 | Discharge: 2011-09-13 | Disposition: A | Discharge status: Home / Self Care | Payer: Medicare Other | Source: Ambulatory Visit | Attending: Geriatric Medicine | Admitting: Geriatric Medicine

## 2011-09-13 DIAGNOSIS — Z1231 Encounter for screening mammogram for malignant neoplasm of breast: Secondary | ICD-10-CM

## 2012-02-29 ENCOUNTER — Other Ambulatory Visit (HOSPITAL_COMMUNITY): Payer: Self-pay | Admitting: Orthopaedic Surgery

## 2012-04-09 ENCOUNTER — Encounter (HOSPITAL_COMMUNITY): Payer: Self-pay | Admitting: Pharmacy Technician

## 2012-04-15 ENCOUNTER — Encounter (HOSPITAL_COMMUNITY)
Admission: RE | Admit: 2012-04-15 | Discharge: 2012-04-15 | Disposition: A | Discharge status: Home / Self Care | Payer: Medicare Other | Source: Ambulatory Visit | Attending: Orthopaedic Surgery | Admitting: Orthopaedic Surgery

## 2012-04-15 ENCOUNTER — Encounter (HOSPITAL_COMMUNITY): Payer: Self-pay

## 2012-04-15 HISTORY — DX: Other specified disorders of bone density and structure, unspecified site: M85.80

## 2012-04-15 LAB — URINALYSIS, ROUTINE W REFLEX MICROSCOPIC
Glucose, UA: NEGATIVE mg/dL
Hgb urine dipstick: NEGATIVE
Protein, ur: NEGATIVE mg/dL
pH: 7 (ref 5.0–8.0)

## 2012-04-15 LAB — BASIC METABOLIC PANEL
BUN: 13 mg/dL (ref 6–23)
Chloride: 103 mEq/L (ref 96–112)
GFR calc Af Amer: >90 mL/min (ref >90)
GFR calc non Af Amer: 83 mL/min — ABNORMAL LOW (ref >90)
Glucose, Bld: 94 mg/dL (ref 70–99)
Potassium: 4.4 mEq/L (ref 3.5–5.1)
Sodium: 139 mEq/L (ref 135–145)

## 2012-04-15 LAB — URINE MICROSCOPIC-ADD ON

## 2012-04-15 LAB — CBC
HCT: 37.8 % (ref 36.0–46.0)
Hemoglobin: 12.4 g/dL (ref 12.0–15.0)
MCH: 31 pg (ref 26.0–34.0)
MCHC: 32.8 g/dL (ref 30.0–36.0)
RBC: 4 MIL/uL (ref 3.87–5.11)

## 2012-04-15 LAB — PROTIME-INR: Prothrombin Time: 12.7 seconds (ref 11.6–15.2)

## 2012-04-15 LAB — SURGICAL PCR SCREEN: MRSA, PCR: NEGATIVE

## 2012-04-15 NOTE — Pre-Procedure Instructions (Signed)
I spoke to [name] at office concerning abnormal UA, she will let  Dr. Magnus Ivan know to review.

## 2012-04-15 NOTE — Patient Instructions (Signed)
[name]  04/15/2012   Your procedure is scheduled on:  04/19/12  Report to SHORT STAY DEPT  at 12:15 PM.  Call this number if you have problems the morning of surgery: [phone]   Remember:   Do not eat food or drink liquids AFTER MIDNIGHT  May have clear liquids UNTIL 6 HOURS BEFORE SURGERY (8:45 AM)  Clear liquids include soda, tea, black coffee, apple or grape juice, broth.  Take these medicines the morning of surgery with A SIP OF WATER:  MAY USE EYE DROPS   Do not wear jewelry, make-up or nail polish.  Do not wear lotions, powders, or perfumes.   Do not shave legs or underarms 48 hrs. before surgery (men may shave face)  Do not bring valuables to the hospital.  Contacts, dentures or bridgework may not be worn into surgery.  Leave suitcase in the car. After surgery it may be brought to your room.  For patients admitted to the hospital, checkout time is 11:00 AM the day of discharge.   Patients discharged the day of surgery will not be allowed to drive home.    Special Instructions:   Please read over the following fact sheets that you were given: MRSA  Information               SHOWER WITH BETASEPT THE NIGHT BEFORE SURGERY AND THE MORNING OF SURGERY

## 2012-04-19 ENCOUNTER — Encounter (HOSPITAL_COMMUNITY): Payer: Self-pay | Admitting: *Deleted

## 2012-04-19 ENCOUNTER — Encounter (HOSPITAL_COMMUNITY): Payer: Self-pay | Admitting: Anesthesiology

## 2012-04-19 ENCOUNTER — Ambulatory Visit (HOSPITAL_COMMUNITY): Payer: Medicare Other

## 2012-04-19 ENCOUNTER — Encounter (HOSPITAL_COMMUNITY)
Admission: RE | Disposition: A | Discharge status: Home Health | Payer: Self-pay | Source: Ambulatory Visit | Attending: Orthopaedic Surgery

## 2012-04-19 ENCOUNTER — Inpatient Hospital Stay (HOSPITAL_COMMUNITY)
Admission: RE | Admit: 2012-04-19 | Discharge: 2012-04-22 | DRG: 470 | Disposition: A | Discharge status: Home Health | Payer: Medicare Other | Source: Ambulatory Visit | Attending: Orthopaedic Surgery | Admitting: Orthopaedic Surgery

## 2012-04-19 ENCOUNTER — Inpatient Hospital Stay (HOSPITAL_COMMUNITY): Payer: Medicare Other

## 2012-04-19 ENCOUNTER — Ambulatory Visit (HOSPITAL_COMMUNITY): Payer: Medicare Other | Admitting: Anesthesiology

## 2012-04-19 DIAGNOSIS — Z79890 Hormone replacement therapy (postmenopausal): Secondary | ICD-10-CM

## 2012-04-19 DIAGNOSIS — F411 Generalized anxiety disorder: Secondary | ICD-10-CM | POA: Diagnosis present

## 2012-04-19 DIAGNOSIS — M1610 Unilateral primary osteoarthritis, unspecified hip: Principal | ICD-10-CM | POA: Diagnosis present

## 2012-04-19 DIAGNOSIS — F329 Major depressive disorder, single episode, unspecified: Secondary | ICD-10-CM | POA: Diagnosis present

## 2012-04-19 DIAGNOSIS — F3289 Other specified depressive episodes: Secondary | ICD-10-CM | POA: Diagnosis present

## 2012-04-19 DIAGNOSIS — M169 Osteoarthritis of hip, unspecified: Secondary | ICD-10-CM

## 2012-04-19 HISTORY — PX: TOTAL HIP ARTHROPLASTY: SHX124

## 2012-04-19 SURGERY — ARTHROPLASTY, HIP, TOTAL, ANTERIOR APPROACH
Anesthesia: General | Site: Hip | Laterality: Left | Wound class: Clean

## 2012-04-19 MED ORDER — NALOXONE HCL 0.4 MG/ML IJ SOLN: 0.4000 mg | INTRAMUSCULAR | Status: DC | PRN

## 2012-04-19 MED ORDER — ONDANSETRON HCL 4 MG PO TABS: 4.0000 mg | ORAL_TABLET | Freq: Four times a day (QID) | ORAL | Status: DC | PRN

## 2012-04-19 MED ORDER — DIPHENHYDRAMINE HCL 12.5 MG/5ML PO ELIX: 12.5000 mg | ORAL_SOLUTION | Freq: Four times a day (QID) | ORAL | Status: DC | PRN

## 2012-04-19 MED ORDER — ACETAMINOPHEN 325 MG PO TABS: 650.0000 mg | ORAL_TABLET | Freq: Four times a day (QID) | ORAL | Status: DC | PRN

## 2012-04-19 MED ORDER — CITALOPRAM HYDROBROMIDE 20 MG PO TABS
20.0000 mg | ORAL_TABLET | ORAL | Status: DC
  Filled 2012-04-19: qty 1

## 2012-04-19 MED ORDER — ACETAMINOPHEN 10 MG/ML IV SOLN
INTRAVENOUS | Status: DC | PRN
  Administered 2012-04-19: 1000 mg via INTRAVENOUS

## 2012-04-19 MED ORDER — MIDAZOLAM HCL 5 MG/5ML IJ SOLN
INTRAMUSCULAR | Status: DC | PRN
  Administered 2012-04-19: 1 mg via INTRAVENOUS

## 2012-04-19 MED ORDER — LIDOCAINE HCL (CARDIAC) 20 MG/ML IV SOLN
INTRAVENOUS | Status: DC | PRN
  Administered 2012-04-19: 50 mg via INTRAVENOUS

## 2012-04-19 MED ORDER — MORPHINE SULFATE (PF) 1 MG/ML IV SOLN
INTRAVENOUS | Status: DC
  Administered 2012-04-19: 20 mg via INTRAVENOUS
  Administered 2012-04-19 (×2): via INTRAVENOUS
  Administered 2012-04-20: 3 mg via INTRAVENOUS
  Administered 2012-04-20: 2 mg via INTRAVENOUS
  Administered 2012-04-20: 9 mg via INTRAVENOUS
  Administered 2012-04-20: 5 mg via INTRAVENOUS
  Administered 2012-04-21: 3 mg via INTRAVENOUS
  Administered 2012-04-21: 2 mg via INTRAVENOUS
  Administered 2012-04-21: 8.39 mg via INTRAVENOUS
  Administered 2012-04-21: 06:00:00 via INTRAVENOUS
  Administered 2012-04-21: 10 mg via INTRAVENOUS
  Filled 2012-04-19 (×3): qty 25

## 2012-04-19 MED ORDER — ADULT MULTIVITAMIN W/MINERALS CH
1.0000 | ORAL_TABLET | Freq: Every day | ORAL | Status: DC
  Administered 2012-04-20 – 2012-04-22 (×3): 1 via ORAL
  Filled 2012-04-19 (×3): qty 1

## 2012-04-19 MED ORDER — SODIUM CHLORIDE 0.9 % IV SOLN
INTRAVENOUS | Status: DC
  Administered 2012-04-19 – 2012-04-20 (×2): via INTRAVENOUS

## 2012-04-19 MED ORDER — ONDANSETRON HCL 4 MG/2ML IJ SOLN
4.0000 mg | Freq: Four times a day (QID) | INTRAMUSCULAR | Status: DC | PRN
  Administered 2012-04-19 – 2012-04-21 (×3): 4 mg via INTRAVENOUS
  Filled 2012-04-19 (×3): qty 2

## 2012-04-19 MED ORDER — METOCLOPRAMIDE HCL 10 MG PO TABS: 5.0000 mg | ORAL_TABLET | Freq: Three times a day (TID) | ORAL | Status: DC | PRN

## 2012-04-19 MED ORDER — KETOROLAC TROMETHAMINE 15 MG/ML IJ SOLN
7.5000 mg | Freq: Four times a day (QID) | INTRAMUSCULAR | Status: AC
  Administered 2012-04-19 – 2012-04-20 (×4): 7.5 mg via INTRAVENOUS
  Filled 2012-04-19 (×3): qty 1

## 2012-04-19 MED ORDER — HYDROMORPHONE HCL PF 1 MG/ML IJ SOLN
INTRAMUSCULAR | Status: AC
  Filled 2012-04-19: qty 1

## 2012-04-19 MED ORDER — LACTATED RINGERS IV SOLN
INTRAVENOUS | Status: DC
  Administered 2012-04-19: 1000 mL via INTRAVENOUS

## 2012-04-19 MED ORDER — 0.9 % SODIUM CHLORIDE (POUR BTL) OPTIME
TOPICAL | Status: DC | PRN
  Administered 2012-04-19: 1000 mL

## 2012-04-19 MED ORDER — ALUM & MAG HYDROXIDE-SIMETH 200-200-20 MG/5ML PO SUSP: 30.0000 mL | ORAL | Status: DC | PRN

## 2012-04-19 MED ORDER — MORPHINE SULFATE 2 MG/ML IJ SOLN: 2.0000 mg | INTRAMUSCULAR | Status: DC | PRN

## 2012-04-19 MED ORDER — ROCURONIUM BROMIDE 100 MG/10ML IV SOLN
INTRAVENOUS | Status: DC | PRN
  Administered 2012-04-19: 35 mg via INTRAVENOUS
  Administered 2012-04-19: 15 mg via INTRAVENOUS

## 2012-04-19 MED ORDER — OXYCODONE HCL 5 MG PO TABS
5.0000 mg | ORAL_TABLET | ORAL | Status: DC | PRN
  Administered 2012-04-21 – 2012-04-22 (×4): 5 mg via ORAL
  Administered 2012-04-22: 10 mg via ORAL
  Filled 2012-04-19 (×2): qty 2
  Filled 2012-04-19: qty 1
  Filled 2012-04-19: qty 2
  Filled 2012-04-19: qty 1

## 2012-04-19 MED ORDER — ONDANSETRON HCL 4 MG/2ML IJ SOLN
INTRAMUSCULAR | Status: DC | PRN
  Administered 2012-04-19: 4 mg via INTRAVENOUS

## 2012-04-19 MED ORDER — KETOROLAC TROMETHAMINE 15 MG/ML IJ SOLN
INTRAMUSCULAR | Status: AC
  Administered 2012-04-20: 7.5 mg via INTRAVENOUS
  Filled 2012-04-19: qty 1

## 2012-04-19 MED ORDER — CITALOPRAM HYDROBROMIDE 40 MG PO TABS: 40.0000 mg | ORAL_TABLET | ORAL | Status: DC

## 2012-04-19 MED ORDER — CEFAZOLIN SODIUM 1-5 GM-% IV SOLN
INTRAVENOUS | Status: AC
  Filled 2012-04-19: qty 50

## 2012-04-19 MED ORDER — DIPHENHYDRAMINE HCL 50 MG/ML IJ SOLN: 12.5000 mg | Freq: Four times a day (QID) | INTRAMUSCULAR | Status: DC | PRN

## 2012-04-19 MED ORDER — FENTANYL CITRATE 0.05 MG/ML IJ SOLN
INTRAMUSCULAR | Status: DC | PRN
  Administered 2012-04-19: 50 ug via INTRAVENOUS
  Administered 2012-04-19 (×2): 100 ug via INTRAVENOUS

## 2012-04-19 MED ORDER — ACETAMINOPHEN 650 MG RE SUPP: 650.0000 mg | Freq: Four times a day (QID) | RECTAL | Status: DC | PRN

## 2012-04-19 MED ORDER — NEOSTIGMINE METHYLSULFATE 1 MG/ML IJ SOLN
INTRAMUSCULAR | Status: DC | PRN
  Administered 2012-04-19: 4 mg via INTRAVENOUS

## 2012-04-19 MED ORDER — MENTHOL 3 MG MT LOZG: 1.0000 | LOZENGE | OROMUCOSAL | Status: DC | PRN

## 2012-04-19 MED ORDER — METOCLOPRAMIDE HCL 5 MG/ML IJ SOLN
5.0000 mg | Freq: Three times a day (TID) | INTRAMUSCULAR | Status: DC | PRN
  Administered 2012-04-20: 10 mg via INTRAVENOUS
  Filled 2012-04-19: qty 10

## 2012-04-19 MED ORDER — ACETAMINOPHEN 10 MG/ML IV SOLN
INTRAVENOUS | Status: AC
  Filled 2012-04-19: qty 100

## 2012-04-19 MED ORDER — CONJ ESTROG-MEDROXYPROGEST ACE 0.45-1.5 MG PO TABS
1.0000 | ORAL_TABLET | Freq: Every day | ORAL | Status: DC
  Administered 2012-04-20 – 2012-04-22 (×3): 1 via ORAL

## 2012-04-19 MED ORDER — DIPHENHYDRAMINE HCL 12.5 MG/5ML PO ELIX: 12.5000 mg | ORAL_SOLUTION | ORAL | Status: DC | PRN

## 2012-04-19 MED ORDER — DOCUSATE SODIUM 100 MG PO CAPS
100.0000 mg | ORAL_CAPSULE | Freq: Two times a day (BID) | ORAL | Status: DC
  Administered 2012-04-19 – 2012-04-22 (×6): 100 mg via ORAL

## 2012-04-19 MED ORDER — HYDROMORPHONE HCL PF 1 MG/ML IJ SOLN
0.2500 mg | INTRAMUSCULAR | Status: DC | PRN
  Administered 2012-04-19 (×4): 0.5 mg via INTRAVENOUS

## 2012-04-19 MED ORDER — ONDANSETRON HCL 4 MG/2ML IJ SOLN: 4.0000 mg | Freq: Four times a day (QID) | INTRAMUSCULAR | Status: DC | PRN

## 2012-04-19 MED ORDER — ASPIRIN EC 325 MG PO TBEC
325.0000 mg | DELAYED_RELEASE_TABLET | Freq: Two times a day (BID) | ORAL | Status: DC
  Administered 2012-04-20 – 2012-04-22 (×5): 325 mg via ORAL
  Filled 2012-04-19 (×7): qty 1

## 2012-04-19 MED ORDER — HYDROMORPHONE HCL PF 1 MG/ML IJ SOLN
INTRAMUSCULAR | Status: DC | PRN
  Administered 2012-04-19: 0.5 mg via INTRAVENOUS

## 2012-04-19 MED ORDER — CEFAZOLIN SODIUM 1-5 GM-% IV SOLN
1.0000 g | Freq: Four times a day (QID) | INTRAVENOUS | Status: AC
  Administered 2012-04-19 – 2012-04-20 (×2): 1 g via INTRAVENOUS
  Filled 2012-04-19 (×4): qty 50

## 2012-04-19 MED ORDER — SODIUM CHLORIDE 0.9 % IJ SOLN: 9.0000 mL | INTRAMUSCULAR | Status: DC | PRN

## 2012-04-19 MED ORDER — MORPHINE SULFATE (PF) 1 MG/ML IV SOLN
INTRAVENOUS | Status: AC
  Administered 2012-04-20: 11 mg via INTRAVENOUS
  Filled 2012-04-19: qty 25

## 2012-04-19 MED ORDER — PROPOFOL 10 MG/ML IV EMUL
INTRAVENOUS | Status: DC | PRN
  Administered 2012-04-19: 130 mg via INTRAVENOUS

## 2012-04-19 MED ORDER — PHENOL 1.4 % MT LIQD: 1.0000 | OROMUCOSAL | Status: DC | PRN

## 2012-04-19 MED ORDER — GLYCOPYRROLATE 0.2 MG/ML IJ SOLN
INTRAMUSCULAR | Status: DC | PRN
  Administered 2012-04-19: .8 mg via INTRAVENOUS

## 2012-04-19 MED ORDER — CEFAZOLIN SODIUM 1-5 GM-% IV SOLN
1.0000 g | INTRAVENOUS | Status: AC
  Administered 2012-04-19: 1 g via INTRAVENOUS

## 2012-04-19 SURGICAL SUPPLY — 35 items
BAG ZIPLOCK 12X15 (MISCELLANEOUS) ×4 IMPLANT
BLADE SAW SGTL 18X1.27X75 (BLADE) ×2 IMPLANT
CELLS DAT CNTRL 66122 CELL SVR (MISCELLANEOUS) ×1 IMPLANT
CLOTH BEACON ORANGE TIMEOUT ST (SAFETY) ×2 IMPLANT
DRAPE C-ARM 42X72 X-RAY (DRAPES) ×2 IMPLANT
DRAPE STERI IOBAN 125X83 (DRAPES) ×2 IMPLANT
DRAPE U-SHAPE 47X51 STRL (DRAPES) ×6 IMPLANT
DRSG MEPILEX BORDER 4X8 (GAUZE/BANDAGES/DRESSINGS) ×2 IMPLANT
DURAPREP 26ML APPLICATOR (WOUND CARE) ×2 IMPLANT
ELECT BLADE TIP CTD 4 INCH (ELECTRODE) ×2 IMPLANT
ELECT REM PT RETURN 9FT ADLT (ELECTROSURGICAL) ×2
ELECTRODE REM PT RTRN 9FT ADLT (ELECTROSURGICAL) ×1 IMPLANT
FACESHIELD LNG OPTICON STERILE (SAFETY) ×8 IMPLANT
GAUZE XEROFORM 1X8 LF (GAUZE/BANDAGES/DRESSINGS) ×2 IMPLANT
GLOVE BIO SURGEON STRL SZ7 (GLOVE) ×2 IMPLANT
GLOVE BIO SURGEON STRL SZ7.5 (GLOVE) ×2 IMPLANT
GLOVE BIOGEL PI IND STRL 7.5 (GLOVE) IMPLANT
GLOVE BIOGEL PI IND STRL 8 (GLOVE) ×1 IMPLANT
GLOVE BIOGEL PI INDICATOR 7.5 (GLOVE)
GLOVE BIOGEL PI INDICATOR 8 (GLOVE) ×1
GLOVE ECLIPSE 7.0 STRL STRAW (GLOVE) ×2 IMPLANT
GOWN STRL REIN XL XLG (GOWN DISPOSABLE) ×4 IMPLANT
KIT BASIN OR (CUSTOM PROCEDURE TRAY) ×2 IMPLANT
PACK TOTAL JOINT (CUSTOM PROCEDURE TRAY) ×2 IMPLANT
PADDING CAST COTTON 6X4 STRL (CAST SUPPLIES) ×2 IMPLANT
RTRCTR WOUND ALEXIS 18CM MED (MISCELLANEOUS) ×2
STAPLER VISISTAT 35W (STAPLE) ×2 IMPLANT
SUT ETHIBOND NAB CT1 #1 30IN (SUTURE) ×4 IMPLANT
SUT VIC AB 1 CT1 36 (SUTURE) ×4 IMPLANT
SUT VIC AB 2-0 CT1 27 (SUTURE) ×2
SUT VIC AB 2-0 CT1 TAPERPNT 27 (SUTURE) ×2 IMPLANT
SUT VLOC 180 0 24IN GS25 (SUTURE) ×2 IMPLANT
TOWEL OR 17X26 10 PK STRL BLUE (TOWEL DISPOSABLE) ×2 IMPLANT
TOWEL OR NON WOVEN STRL DISP B (DISPOSABLE) ×2 IMPLANT
TRAY FOLEY CATH 14FRSI W/METER (CATHETERS) ×2 IMPLANT

## 2012-04-19 NOTE — Anesthesia Postprocedure Evaluation (Signed)
  Anesthesia Post-op Note  Patient: [name]  Procedure(s) Performed: Procedure(s) (LRB): TOTAL HIP ARTHROPLASTY ANTERIOR APPROACH (Left)  Patient Location: PACU  Anesthesia Type: General  Level of Consciousness: oriented and sedated  Airway and Oxygen Therapy: Patient Spontanous Breathing and Patient connected to nasal cannula oxygen  Post-op Pain: mild  Post-op Assessment: Post-op Vital signs reviewed, Patient's Cardiovascular Status Stable, Respiratory Function Stable and Patent Airway  Post-op Vital Signs: stable  Complications: No apparent anesthesia complications

## 2012-04-19 NOTE — H&P (Signed)
[name] is an 71 y.o. female.   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  Wishes to proceed with a left total hip replacement given the failure of conservative treatment.  Understands the risks of blood loss, DVT, PE, infection, etc.  The goals are decreased pain and improved mobility.  Past Medical History  Diagnosis Date  . Anxiety   . Depression   . Arthritis   . 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 Osteopenia     Past Surgical History  Procedure Date  . Breast lumpectomy 1961  . Appendectomy 2010    Family History  Problem Relation Age of Onset  . Cancer Father     prostate  . Hypertension Father   . Heart attack Father   . Cancer Brother     Designer, industrial/product  . Cancer Maternal Grandmother     throat  . Breast cancer Paternal Grandmother    Social History:  reports that she quit smoking about 26 years ago. She has never used smokeless tobacco. She reports that she drinks about 3.5 ounces of alcohol per week. She reports that she does not use illicit drugs.  Allergies: No Known Allergies  Medications Prior to Admission  Medication Sig Dispense Refill  . calcium-vitamin D (OSCAL WITH D) 500-200 MG-UNIT per tablet Take 1 tablet by mouth daily.      . cholecalciferol (VITAMIN D) 1000 UNITS tablet Take 1,000 Units by mouth daily.      . citalopram (CELEXA) 40 MG tablet Take 40 mg by mouth See admin instructions. Takes 1/2 tablet every other day      . estrogen, conjugated,-medroxyprogesterone (PREMPRO) 0.45-1.5 MG per tablet Take 1 tablet by mouth daily.  30 tablet  11  . fish oil-omega-3 fatty acids 1000 MG capsule Take 1 g by mouth daily.      . Multiple Vitamin (MULTIVITAMIN WITH MINERALS) TABS Take 1 tablet by mouth daily.      . naproxen sodium (ANAPROX) 220 MG tablet Take 220 mg by mouth 2 (two) times daily with a meal.      . Polyethyl Glycol-Propyl  Glycol (SYSTANE OP) Place 1 drop into both eyes daily.        Results for orders placed during the hospital encounter of 04/19/12 (from the past 48 hour(s))  TYPE AND SCREEN     Status: Normal   Collection Time   04/19/12 12:40 PM      Component Value Range Comment   ABO/RH(D) A POS      Antibody Screen NEG      Sample Expiration 04/22/2012     ABO/RH     Status: Normal   Collection Time   04/19/12 12:45 PM      Component Value Range Comment   ABO/RH(D) A POS      No results found.  Review of Systems  All other systems reviewed and are negative.    Blood pressure 147/69, pulse 70, temperature 98.6 F (37 C), resp. rate 20, last menstrual period 10/31/1991, SpO2 98.00%. Physical Exam  Constitutional: She is oriented to person, place, and time. She appears well-developed and well-nourished.  HENT:  Head: Normocephalic and atraumatic.  Eyes: EOM are normal. Pupils are equal, round, and reactive to light.  Neck: Normal range of motion. Neck supple.  Cardiovascular: Normal rate and regular rhythm.   Respiratory:  Effort normal and breath sounds normal.  GI: Soft. Bowel sounds are normal.  Musculoskeletal:       Left hip: She exhibits decreased range of motion, decreased strength and bony tenderness.  Neurological: She is alert and oriented to person, place, and time.  Skin: Skin is warm and dry.  Psychiatric: She has a normal mood and affect.     Assessment/Plan Severe left hip arthritis 1) to the OR for a left total hip replacement  [name] Y 04/19/2012, 3:12 PM

## 2012-04-19 NOTE — Preoperative (Signed)
Beta Blockers   Reason not to administer Beta Blockers:Not Applicable 

## 2012-04-19 NOTE — Progress Notes (Signed)
Dr. Shireen Quan notified that patient had clear liq until 1030am

## 2012-04-19 NOTE — Transfer of Care (Signed)
Immediate Anesthesia Transfer of Care Note  Patient: [name]  Procedure(s) Performed: Procedure(s) (LRB): TOTAL HIP ARTHROPLASTY ANTERIOR APPROACH (Left)  Patient Location: PACU  Anesthesia Type: General  Level of Consciousness: awake, alert , oriented and patient cooperative  Airway & Oxygen Therapy: Patient Spontanous Breathing and Patient connected to face mask oxygen  Post-op Assessment: Report given to PACU RN, Post -op Vital signs reviewed and stable and Patient moving all extremities  Post vital signs: Reviewed and stable  Complications: No apparent anesthesia complications

## 2012-04-19 NOTE — Brief Op Note (Signed)
04/19/2012  5:40 PM  PATIENT:  Dannette Barbara Nery  71 y.o. female  PRE-OPERATIVE DIAGNOSIS:  Osteoarthritis left hip  POST-OPERATIVE DIAGNOSIS:  Osteoarthritis left hip  PROCEDURE:  Procedure(s) (LRB): TOTAL HIP ARTHROPLASTY ANTERIOR APPROACH (Left)  SURGEON:  Surgeon(s) and Role:    * Kathryne Hitch, MD - Primary    * Velna Ochs, MD - Assisting  PHYSICIAN ASSISTANT:   ASSISTANTS: Hurshel Keys, MD    ANESTHESIA:   general  EBL:  Total I/O In: 2400 [I.V.:2400] Out: 575 [Urine:275; Blood:300]  BLOOD ADMINISTERED:none  DRAINS: none   LOCAL MEDICATIONS USED:  NONE  SPECIMEN:  No Specimen  DISPOSITION OF SPECIMEN:  N/A  COUNTS:  YES  TOURNIQUET:  * No tourniquets in log *  DICTATION: .Other Dictation: Dictation Number [id number]  PLAN OF CARE: Admit to inpatient   PATIENT DISPOSITION:  PACU - hemodynamically stable.   Delay start of Pharmacological VTE agent (>24hrs) due to surgical blood loss or risk of bleeding: no

## 2012-04-19 NOTE — Anesthesia Preprocedure Evaluation (Addendum)
Anesthesia Evaluation  Patient identified by MRN, date of birth, ID band Patient awake    Reviewed: Allergy & Precautions, H&P , NPO status , Patient's Chart, lab work & pertinent test results, reviewed documented beta blocker date and time   History of Anesthesia Complications (+) AWARENESS UNDER ANESTHESIA  Airway Mallampati: II TM Distance: >3 FB Neck ROM: Full    Dental  (+) Teeth Intact and Dental Advisory Given   Pulmonary neg pulmonary ROS,  breath sounds clear to auscultation        Cardiovascular negative cardio ROS  Rhythm:Regular Rate:Normal  Denies cardiac symptoms   Neuro/Psych negative neurological ROS  negative psych ROS   GI/Hepatic negative GI ROS, Neg liver ROS,   Endo/Other  negative endocrine ROS  Renal/GU negative Renal ROS  negative genitourinary   Musculoskeletal   Abdominal   Peds negative pediatric ROS (+)  Hematology negative hematology ROS (+)   Anesthesia Other Findings   Reproductive/Obstetrics negative OB ROS                          Anesthesia Physical Anesthesia Plan  ASA: II  Anesthesia Plan: General   Post-op Pain Management:    Induction: Intravenous  Airway Management Planned: Oral ETT  Additional Equipment:   Intra-op Plan:   Post-operative Plan: Extubation in OR  Informed Consent: I have reviewed the patients History and Physical, chart, labs and discussed the procedure including the risks, benefits and alternatives for the proposed anesthesia with the patient or authorized representative who has indicated his/her understanding and acceptance.   Dental advisory given  Plan Discussed with: CRNA and Surgeon  Anesthesia Plan Comments:         Anesthesia Quick Evaluation

## 2012-04-20 LAB — BASIC METABOLIC PANEL
BUN: 12 mg/dL (ref 6–23)
Calcium: 8.3 mg/dL — ABNORMAL LOW (ref 8.4–10.5)
Chloride: 103 mEq/L (ref 96–112)
Creatinine, Ser: 0.67 mg/dL (ref 0.50–1.10)
GFR calc Af Amer: >90 mL/min (ref >90)
GFR calc non Af Amer: 85 mL/min — ABNORMAL LOW (ref >90)

## 2012-04-20 LAB — CBC
HCT: 35.1 % — ABNORMAL LOW (ref 36.0–46.0)
MCHC: 32.5 g/dL (ref 30.0–36.0)
MCV: 94.9 fL (ref 78.0–100.0)
Platelets: 189 10*3/uL (ref 150–400)
RDW: 13.4 % (ref 11.5–15.5)
WBC: 10.5 10*3/uL (ref 4.0–10.5)

## 2012-04-20 MED ORDER — CITALOPRAM HYDROBROMIDE 20 MG PO TABS
20.0000 mg | ORAL_TABLET | Freq: Every day | ORAL | Status: DC
  Administered 2012-04-20: 20 mg via ORAL
  Filled 2012-04-20 (×3): qty 1

## 2012-04-20 MED ORDER — CITALOPRAM HYDROBROMIDE 20 MG PO TABS
20.0000 mg | ORAL_TABLET | ORAL | Status: DC
  Filled 2012-04-20: qty 1

## 2012-04-20 NOTE — Op Note (Signed)
[name], [name]                 ACCOUNT NO.:  [id number]  MEDICAL RECORD NO.:  [id number]  LOCATION:  1601                         FACILITY:  [facility]  PHYSICIAN:  Vanita Panda. Magnus Ivan, M.D.DATE OF BIRTH:  08/11/1941  DATE OF PROCEDURE:  04/19/2012 DATE OF DISCHARGE:                              OPERATIVE REPORT   PREOPERATIVE DIAGNOSIS:  Severe end-stage arthritis, left hip.  POSTOPERATIVE DIAGNOSIS:  Severe end-stage arthritis, left hip.  PROCEDURE:  Left total hip arthroplasty through direct anterior approach.  IMPLANTS:  DePuy sector Gription acetabular component size 50, size 32+ 4 neutral polyethylene liner, size 11 femoral component from [place] with standard offset, size 32+ 1 metal hip ball.  SURGEON:  Vanita Panda. Magnus Ivan, M.D.  ASSISTANT:  Lubertha Basque. Jerl Santos, M.D.  ANESTHESIA:  General.  ANTIBIOTICS:  1 g IV Ancef.  BLOOD LOSS:  300-400 mL.  COMPLICATIONS:  None.  INDICATIONS:  [name] is a 71 year old active female with severe end- stage arthritis of her left hip.  It is greatly affecting her activities of daily living to the point that she wished to proceed with a total hip arthroplasty.  She has failed conservative treatment including injections, rest, anti-inflammatories, and time.  Her x-rays show bone- on-bone wear, and she wishes to proceed with surgery.  PROCEDURE DESCRIPTION:  After informed consent was obtained, appropriate left hip was marked.  She was brought to the operating room.  General anesthesia was obtained while she was on the stretcher.  A Foley catheter was placed and traction boots were placed on both of her feet. Then she was placed supine on the Hana fracture table with perineal post in place.  Both legs were placed in an in-line skeletal traction with no traction applied.  Her left hip was then prepped and draped with DuraPrep and sterile drapes.  A time-out was called.  She was identified as correct patient and correct left  hip.  I then made an incision just posterior and inferior to the anterosuperior iliac spine and carried this obliquely down the leg.  I dissected down through the soft tissues down to the tensor fascia lata.  The tensor fascia lata was then divided obliquely, and I proceeded with a direct anterior approach to the hip. The cobra retractors were placed around the lateral neck and then up underneath the rectus femoris, a medial retractor was placed.  I used electrocautery to cauterize the lateral femoral circumflex vessels.  I then divided the hip capsule in a T-type fashion, and placed the cobra retractors within the hip capsule.  I then made my femoral neck cut just proximal to the lesser trochanter with an oscillating saw and finished this with an osteotome.  I placed a corkscrew guide in the femoral head and removed the femoral head in its entirety.  I then cleaned the acetabulum debris and placed the Bent Hohmann medially and a cobra retractor laterally.  I removed the soft tissue remnants of the acetabular labrum and cut the transverse acetabular ligament.  I then began reaming from a size 42 reamer and 2 mm increments all the way up to a size 50 with all reamers placed under direct visualization.  The last reamer also placed under direct fluoroscopy, so I could obtain my depth of reaming and my anteversion and inclination.  I then placed the real size 50 acetabular component with Gription, which was a diffuse sector Gription component.  Once this was knocked into place under direct visualization and fluoroscopy, I placed a hole eliminator guide and then the real 32+ 4 neutral polyethylene liner.  Attention was then turned to the femur.  With all traction off the leg, the leg was externally rotated to 90 degrees, extended and adducted.  Mueller retractor was placed medially and retractor was placed behind the lateral capsule and the greater trochanter.  I released the lateral capsule  medially as well as the piriformis.  I then used a box cutting guide and a rongeur to enter the femoral canal, began broaching from a size 9 broach up to a size 11 broach.  The 11 broach was felt to be stable.  So I used a calcar planer on this.  I then trialed a standard neck and a 32+ 1 hip ball and reduced this in the acetabulum.  She was stable with internal external rotation with minimal shuck and her leg lengths were measured equal on the fluoroscopy.  I then removed all trial components after re-dislocating the hip.  I placed the real size 11 femoral component with collar followed by the real 32+ 1 metal hip ball and reduced this back in the acetabulum and it was grossly stable. I copiously irrigated the soft tissues with normal saline and closed the joint capsule with interrupted #1 Ethibond suture.  I used a running 0 V- Loc suture to close the tensor fascia lata followed by 2-0 Vicryl and subcutaneous tissue, staples on the skin, and well-padded sterile dressing was applied.  She was taken off the Hana table, awakened, extubated, and taken to recovery room in stable condition.  All final counts were correct.  There were no complications noted.     Vanita Panda. Magnus Ivan, M.D.     CYB/MEDQ  D:  04/19/2012  T:  04/20/2012  Job:  [id number]

## 2012-04-20 NOTE — Progress Notes (Signed)
Subjective: 1 Day Post-Op Procedure(s) (LRB): TOTAL HIP ARTHROPLASTY ANTERIOR APPROACH (Left) Patient reports pain as moderate.    Objective: Vital signs in last 24 hours: Temp:  [97.6 F (36.4 C)-98.6 F (37 C)] 98.1 F (36.7 C) (06/22 0615) Pulse Rate:  [58-74] 64  (06/22 0615) Resp:  [8-20] 16  (06/22 0615) BP: (108-150)/(55-84) 121/66 mmHg (06/22 0615) SpO2:  [94 %-100 %] 100 % (06/22 0615) Weight:  [70.308 kg (155 lb)] 70.308 kg (155 lb) (06/21 1841)  Intake/Output from previous day: 06/21 0701 - 06/22 0700 In: 3492.5 [P.O.:480; I.V.:3012.5] Out: 1330 [Urine:1030; Blood:300] Intake/Output this shift: Total I/O In: 480 [P.O.:480] Out: -    Basename 04/20/12 0429  HGB 11.4*    Basename 04/20/12 0429  WBC 10.5  RBC 3.70*  HCT 35.1*  PLT 189    Basename 04/20/12 0429  NA 136  K 3.5  CL 103  CO2 27  BUN 12  CREATININE 0.67  GLUCOSE 157*  CALCIUM 8.3*   No results found for this basename: LABPT:2,INR:2 in the last 72 hours  Sensation intact distally Intact pulses distally Dorsiflexion/Plantar flexion intact Incision: dressing C/D/I  Assessment/Plan: 1 Day Post-Op Procedure(s) (LRB): TOTAL HIP ARTHROPLASTY ANTERIOR APPROACH (Left) Up with therapy  Dillan Lunden Y 04/20/2012, 8:26 AM

## 2012-04-20 NOTE — Evaluation (Signed)
Physical Therapy Evaluation Patient Details Name: [name] MRN: [id number] DOB: 03/04/1941 Today's Date: 04/20/2012 Time: 4098-1191 PT Time Calculation (min): 46 min  PT Assessment / Plan / Recommendation Clinical Impression  Pt with L THR presents with decreased L LE strength/ROM and limitations in functiional mobiltiy    PT Assessment  Patient needs continued PT services    Follow Up Recommendations  Home health PT    Barriers to Discharge        lEquipment Recommendations  None recommended by PT    Recommendations for Other Services OT consult   Frequency 7X/week    Precautions / Restrictions Restrictions Weight Bearing Restrictions: No LUE Weight Bearing: Weight bearing as tolerated   Pertinent Vitals/Pain 4-5/10; PCA used; ice pack provided      Mobility  Bed Mobility Bed Mobility: Supine to Sit Supine to Sit: 3: Mod assist Details for Bed Mobility Assistance: cues for sequence and use of UEs and R LE to selfa assist Transfers Transfers: Sit to Stand;Stand to Sit Sit to Stand: 1: +2 Total assist Sit to Stand: Patient Percentage: 70% Stand to Sit: 1: +2 Total assist Stand to Sit: Patient Percentage: 70% Details for Transfer Assistance: cues for LE management and use of UEs to self assist Ambulation/Gait Ambulation/Gait Assistance: 1: +2 Total assist Ambulation/Gait: Patient Percentage: 70% Ambulation Distance (Feet): 3 Feet Assistive device: Rolling walker Ambulation/Gait Assistance Details: cues for sequence, posture and position from RW; pt limited by nausea Gait Pattern: Step-to pattern    Exercises Total Joint Exercises Ankle Circles/Pumps: AROM;15 reps;Supine;Both Quad Sets: AROM;10 reps;Both;Supine Heel Slides: AAROM;15 reps;Supine;Left Hip ABduction/ADduction: AAROM;15 reps;Supine;Left   PT Diagnosis: Difficulty walking  PT Problem List: Decreased strength;Decreased range of motion;Decreased activity tolerance;Decreased mobility;Decreased  knowledge of use of DME;Pain PT Treatment Interventions: DME instruction;Gait training;Stair training;Functional mobility training;Therapeutic exercise;Therapeutic activities;Patient/family education   PT Goals Acute Rehab PT Goals PT Goal Formulation: With patient Time For Goal Achievement: 04/25/12 Potential to Achieve Goals: Good Pt will go Supine/Side to Sit: with supervision PT Goal: Supine/Side to Sit - Progress: Goal set today Pt will go Sit to Supine/Side: with supervision PT Goal: Sit to Supine/Side - Progress: Goal set today Pt will go Sit to Stand: with supervision PT Goal: Sit to Stand - Progress: Goal set today Pt will go Stand to Sit: with supervision PT Goal: Stand to Sit - Progress: Goal set today Pt will Ambulate: 51 - 150 feet;with supervision;with rolling walker PT Goal: Ambulate - Progress: Goal set today Pt will Go Up / Down Stairs: 3-5 stairs;with min assist;with least restrictive assistive device PT Goal: Up/Down Stairs - Progress: Goal set today  Visit Information  Last PT Received On: 04/20/12 Assistance Needed: +2    Subjective Data  Subjective: Its not hurting as much as I thought it might but I am a little nauseous Patient Stated Goal: Resume previous lifestyle with decreaed pain   Prior Functioning  Home Living Lives With: Alone Available Help at Discharge: Friend(s) Type of Home: House Home Access: Stairs to enter [employer] of Steps: 3+1 Entrance Stairs-Rails: Left Home Layout: One level Home Adaptive Equipment: Walker - rolling Prior Function Level of Independence: Independent Able to Take Stairs?: Yes Driving: Yes Communication Communication: No difficulties    Cognition  Overall Cognitive Status: Appears within functional limits for tasks assessed/performed Arousal/Alertness: Awake/alert Orientation Level: Appears intact for tasks assessed Behavior During Session: [facility] for tasks performed    Extremity/Trunk Assessment  Right Upper Extremity Assessment RUE ROM/Strength/Tone: Within functional  levels Left Upper Extremity Assessment LUE ROM/Strength/Tone: Within functional levels Right Lower Extremity Assessment RLE ROM/Strength/Tone: Within functional levels Left Lower Extremity Assessment LLE ROM/Strength/Tone: Deficits LLE ROM/Strength/Tone Deficits: AAROM at hip limited by discomfort to 90 flex and 20 abd; 2+/5 hip strength   Balance    End of Session PT - End of Session Equipment Utilized During Treatment: Gait belt Activity Tolerance: Other (comment) (ltd by nausea) Patient left: in chair;with call bell/phone within reach Nurse Communication: Mobility status;Other (comment) (pt nauseous)   Aydien Majette 04/20/2012, 11:58 AM

## 2012-04-20 NOTE — Progress Notes (Signed)
Cm spoke with patient concerning dc planning. Pt offered choice, Per pt choice [facility] to provide [facility] services upon discharge. No DME needed. Patient states having RW & elevated toilet seat at home. Friends to assist in home care. No other needs stated.    Leonie Green [phone]

## 2012-04-20 NOTE — Progress Notes (Signed)
Physical Therapy Treatment Patient Details Name: [name] MRN: [id number] DOB: Jul 28, 1941 Today's Date: 04/20/2012 Time: 3086-5784 PT Time Calculation (min): 33 min  PT Assessment / Plan / Recommendation Comments on Treatment Session  Marked improvement in activity tolerance vs morning session    Follow Up Recommendations  Home health PT    Barriers to Discharge        Equipment Recommendations  None recommended by PT    Recommendations for Other Services OT consult  Frequency 7X/week   Plan Discharge plan remains appropriate    Precautions / Restrictions Restrictions Weight Bearing Restrictions: No LUE Weight Bearing: Weight bearing as tolerated   Pertinent Vitals/Pain 3/10, PCA, ice pack provided    Mobility  Bed Mobility Bed Mobility: Sit to Supine Sit to Supine: 4: Min assist Details for Bed Mobility Assistance: cues for sequence and use of UEs and R LE to selfa assist Transfers Transfers: Sit to Stand;Stand to Sit Sit to Stand: 4: Min assist;3: Mod assist Stand to Sit: 4: Min assist;3: Mod assist Details for Transfer Assistance: cues for LE management and use of UEs to self assist Ambulation/Gait Ambulation/Gait Assistance: 1: +2 Total assist Ambulation/Gait: Patient Percentage: 70% Ambulation Distance (Feet): 72 Feet Assistive device: Rolling walker Ambulation/Gait Assistance Details: cues for posture, sequence and position from RW Gait Pattern: Step-to pattern    Exercises     PT Diagnosis:    PT Problem List:   PT Treatment Interventions:     PT Goals Acute Rehab PT Goals PT Goal Formulation: With patient Time For Goal Achievement: 04/25/12 Potential to Achieve Goals: Good Pt will go Supine/Side to Sit: with supervision PT Goal: Supine/Side to Sit - Progress: Progressing toward goal Pt will go Sit to Supine/Side: with supervision PT Goal: Sit to Supine/Side - Progress: Progressing toward goal Pt will go Sit to Stand: with supervision PT Goal:  Sit to Stand - Progress: Progressing toward goal Pt will go Stand to Sit: with supervision PT Goal: Stand to Sit - Progress: Progressing toward goal Pt will Ambulate: 51 - 150 feet;with supervision;with rolling walker PT Goal: Ambulate - Progress: Progressing toward goal Pt will Go Up / Down Stairs: 3-5 stairs;with min assist;with least restrictive assistive device PT Goal: Up/Down Stairs - Progress: Goal set today  Visit Information  Last PT Received On: 04/20/12 Assistance Needed: +2 (+2 2* to pt's intermittant nausea)    Subjective Data  Subjective: I'm doing better than this morning Patient Stated Goal: Resume previous lifestyle with decreaed pain   Cognition  Overall Cognitive Status: Appears within functional limits for tasks assessed/performed Arousal/Alertness: Awake/alert Orientation Level: Appears intact for tasks assessed Behavior During Session: [facility] for tasks performed    Balance     End of Session PT - End of Session Equipment Utilized During Treatment: Gait belt Activity Tolerance: Patient tolerated treatment well Patient left: in bed;with call bell/phone within reach Nurse Communication: Mobility status;Other (comment)    Merril Isakson 04/20/2012, 3:23 PM

## 2012-04-21 LAB — CBC
HCT: 30.8 % — ABNORMAL LOW (ref 36.0–46.0)
MCHC: 33.1 g/dL (ref 30.0–36.0)
Platelets: 153 10*3/uL (ref 150–400)
RDW: 13.5 % (ref 11.5–15.5)
WBC: 9.8 10*3/uL (ref 4.0–10.5)

## 2012-04-21 MED ORDER — SODIUM CHLORIDE 0.9 % IV SOLN: INTRAVENOUS | Status: DC

## 2012-04-21 MED ORDER — METHOCARBAMOL 500 MG PO TABS
500.0000 mg | ORAL_TABLET | Freq: Four times a day (QID) | ORAL | Status: DC | PRN
  Administered 2012-04-21 – 2012-04-22 (×2): 500 mg via ORAL
  Filled 2012-04-21 (×2): qty 1

## 2012-04-21 NOTE — Progress Notes (Signed)
Subjective: 2 Days Post-Op Procedure(s) (LRB): TOTAL HIP ARTHROPLASTY ANTERIOR APPROACH (Left) Patient reports pain as moderate.    Objective: Vital signs in last 24 hours: Temp:  [98.2 F (36.8 C)-98.5 F (36.9 C)] 98.5 F (36.9 C) (06/23 8657) Pulse Rate:  [61-73] 73  (06/23 0608) Resp:  [14-17] 16  (06/23 0747) BP: (101-119)/(60-66) 115/60 mmHg (06/23 0608) SpO2:  [96 %-100 %] 99 % (06/23 0747)  Intake/Output from previous day: 06/22 0701 - 06/23 0700 In: 2548.8 [P.O.:1580; I.V.:968.8] Out: 950 [Urine:950] Intake/Output this shift:     Basename 04/21/12 0442 04/20/12 0429  HGB 10.2* 11.4*    Basename 04/21/12 0442 04/20/12 0429  WBC 9.8 10.5  RBC 3.23* 3.70*  HCT 30.8* 35.1*  PLT 153 189    Basename 04/20/12 0429  NA 136  K 3.5  CL 103  CO2 27  BUN 12  CREATININE 0.67  GLUCOSE 157*  CALCIUM 8.3*   No results found for this basename: LABPT:2,INR:2 in the last 72 hours  Sensation intact distally Intact pulses distally Dorsiflexion/Plantar flexion intact Incision: no drainage  Assessment/Plan: 2 Days Post-Op Procedure(s) (LRB): TOTAL HIP ARTHROPLASTY ANTERIOR APPROACH (Left) Plan for discharge tomorrow  Kathryne Hitch 04/21/2012, 8:25 AM

## 2012-04-21 NOTE — Progress Notes (Signed)
Physical Therapy Treatment Patient Details Name: [name] MRN: [id number] DOB: 1941/08/17 Today's Date: 04/21/2012 Time: 4098-1191 PT Time Calculation (min): 28 min  PT Assessment / Plan / Recommendation Comments on Treatment Session       Follow Up Recommendations  Home health PT    Barriers to Discharge        Equipment Recommendations  None recommended by PT    Recommendations for Other Services OT consult  Frequency 7X/week   Plan Discharge plan remains appropriate    Precautions / Restrictions Precautions Precautions: None Restrictions Weight Bearing Restrictions: Yes LUE Weight Bearing: Weight bearing as tolerated   Pertinent Vitals/Pain 3/10    Mobility  Bed Mobility Bed Mobility: Supine to Sit Supine to Sit: 4: Min guard Details for Bed Mobility Assistance: cues for sequence and use of UEs and R LE to selfa assist Transfers Transfers: Sit to Stand;Stand to Sit Sit to Stand: 4: Min guard Stand to Sit: 4: Min guard Details for Transfer Assistance: cues for LE management and use of UEs to self assist Ambulation/Gait Ambulation/Gait Assistance: 4: Min guard Ambulation Distance (Feet): 400 Feet Assistive device: Rolling walker Ambulation/Gait Assistance Details: cues for posture and position from RW. Pt progressed to recip gait  Gait Pattern: Step-to pattern;Step-through pattern    Exercises     PT Diagnosis:    PT Problem List:   PT Treatment Interventions:     PT Goals Acute Rehab PT Goals PT Goal Formulation: With patient Time For Goal Achievement: 04/25/12 Potential to Achieve Goals: Good Pt will go Supine/Side to Sit: with supervision PT Goal: Supine/Side to Sit - Progress: Progressing toward goal Pt will go Sit to Supine/Side: with supervision PT Goal: Sit to Supine/Side - Progress: Progressing toward goal Pt will go Sit to Stand: with supervision PT Goal: Sit to Stand - Progress: Progressing toward goal Pt will go Stand to Sit: with  supervision PT Goal: Stand to Sit - Progress: Progressing toward goal Pt will Ambulate: 51 - 150 feet;with supervision;with rolling walker PT Goal: Ambulate - Progress: Progressing toward goal  Visit Information  Last PT Received On: 04/21/12 Assistance Needed: +1    Subjective Data  Subjective: No new complaints Patient Stated Goal: Resume previous lifestyle with decreaed pain   Cognition  Overall Cognitive Status: Appears within functional limits for tasks assessed/performed Arousal/Alertness: Awake/alert Orientation Level: Appears intact for tasks assessed Behavior During Session: [facility] for tasks performed    Balance     End of Session PT - End of Session Activity Tolerance: Patient tolerated treatment well Patient left: in chair;with call bell/phone within reach Nurse Communication: Mobility status;Other (comment)    Abrish Erny 04/21/2012, 1:00 PM

## 2012-04-21 NOTE — Evaluation (Signed)
Occupational Therapy Evaluation Patient Details Name: [name] MRN: [id number] DOB: 03-21-1941 Today's Date: 04/21/2012 Time: 4098-1191 OT Time Calculation (min): 31 min  OT Assessment / Plan / Recommendation Clinical Impression  Pt presents to OT s/p THR. Pt doing well, but will benefit from OT to increase I with ADL activity and return to PLOF of being I with ADL activity and living alone    OT Assessment  Patient needs continued OT Services    Follow Up Recommendations  Home health OT       Equipment Recommendations  3 in 1 bedside comode       Frequency  Min 2X/week    Precautions / Restrictions Precautions Precautions: None Restrictions Weight Bearing Restrictions: Yes LUE Weight Bearing: Weight bearing as tolerated       ADL  Grooming: Performed;Minimal assistance Where Assessed - Grooming: Supported standing;Other (comment) (walker) Upper Body Bathing: Simulated;Minimal assistance Where Assessed - Upper Body Bathing: Unsupported sitting Lower Body Bathing: Simulated;Moderate assistance Where Assessed - Lower Body Bathing: Supported sit to stand;Other (comment) (walker) Upper Body Dressing: Simulated;Set up Where Assessed - Upper Body Dressing: Unsupported sitting Lower Body Dressing: Performed;Moderate assistance;Other (comment) (with AE) Where Assessed - Lower Body Dressing: Sopported sit to stand Toilet Transfer: Performed;Min guard Toilet Transfer Method: Other (comment) (ambulating with walker) Toilet Transfer Equipment: Raised toilet seat with arms (or 3-in-1 over toilet);Grab bars Toileting - Clothing Manipulation and Hygiene: Min guard Where Assessed - Engineer, mining and Hygiene: Standing Tub/Shower Transfer Method: Not assessed    OT Diagnosis: Generalized weakness;Acute pain  OT Problem List: Decreased strength;Decreased activity tolerance OT Treatment Interventions: Self-care/ADL training;Patient/family education;DME and/or AE  instruction   OT Goals Acute Rehab OT Goals OT Goal Formulation: With patient Time For Goal Achievement: 04/28/12 ADL Goals Pt Will Perform Grooming: with modified independence;Standing at sink ADL Goal: Grooming - Progress: Goal set today Pt Will Perform Lower Body Bathing: with modified independence;Sit to stand from chair;with adaptive equipment ADL Goal: Lower Body Bathing - Progress: Goal set today Pt Will Perform Lower Body Dressing: with modified independence;with adaptive equipment;Sit to stand from chair ADL Goal: Lower Body Dressing - Progress: Goal set today Pt Will Transfer to Toilet: with modified independence;3-in-1 ADL Goal: Toilet Transfer - Progress: Goal set today Pt Will Perform Tub/Shower Transfer: Shower transfer ADL Goal: Tub/Shower Transfer - Progress: Goal set today  Visit Information  Last OT Received On: 04/21/12 Assistance Needed: +1       Prior Functioning  Home Living Lives With: Alone Available Help at Discharge: Friend(s) Type of Home: House Home Access: Stairs to enter Secretary/administrator of Steps: 3+1 Entrance Stairs-Rails: Left Home Layout: One level Bathroom Shower/Tub: Health visitor: Standard Home Adaptive Equipment: Walker - rolling;Tub transfer bench Prior Function Level of Independence: Independent Able to Take Stairs?: Yes Driving: Yes Communication Communication: No difficulties    Cognition  Overall Cognitive Status: Appears within functional limits for tasks assessed/performed Arousal/Alertness: Awake/alert Orientation Level: Appears intact for tasks assessed Behavior During Session: [facility] for tasks performed       Mobility Bed Mobility Bed Mobility: Supine to Sit Supine to Sit: 4: Min guard Sit to Supine: 4: Min assist Details for Bed Mobility Assistance: cues for sequence and use of UEs and R LE to selfa assist Transfers Transfers: Sit to Stand;Stand to Sit Sit to Stand: With upper extremity  assist;From bed;From chair/3-in-1;From toilet Stand to Sit: To chair/3-in-1;To bed;To toilet;With upper extremity assist;With armrests Details for Transfer Assistance: cues for  LE management and use of UEs to self assist         End of Session OT - End of Session Activity Tolerance: Patient tolerated treatment well Patient left: in bed;with call bell/phone within reach;with family/visitor present   Prestyn Stanco, Metro Kung 04/21/2012, 1:07 PM

## 2012-04-22 LAB — CBC
MCH: 30.9 pg (ref 26.0–34.0)
MCHC: 32.6 g/dL (ref 30.0–36.0)
RDW: 13.4 % (ref 11.5–15.5)

## 2012-04-22 MED ORDER — ASPIRIN 325 MG PO TBEC: 325.0000 mg | DELAYED_RELEASE_TABLET | Freq: Two times a day (BID) | ORAL | Status: AC

## 2012-04-22 MED ORDER — METHOCARBAMOL 500 MG PO TABS: 500.0000 mg | ORAL_TABLET | Freq: Four times a day (QID) | ORAL | Status: AC | PRN

## 2012-04-22 MED ORDER — OXYCODONE-ACETAMINOPHEN 5-325 MG PO TABS: 1.0000 | ORAL_TABLET | ORAL | Status: AC | PRN

## 2012-04-22 NOTE — Discharge Summary (Signed)
Patient ID: [name] MRN: [id number] DOB/AGE: 06-30-1941 71 y.o.  Admit date: 04/19/2012 Discharge date: 04/22/2012  Admission Diagnoses:  Principal Problem:  *Degenerative arthritis of hip   Discharge Diagnoses:  Same  Past Medical History  Diagnosis Date  . Anxiety   . Depression   . Arthritis   . 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 Osteopenia     Surgeries: Procedure(s): TOTAL HIP ARTHROPLASTY ANTERIOR APPROACH on 04/19/2012   Consultants:    Discharged Condition: Improved  Hospital Course: [name] is an 71 y.o. female who was admitted 04/19/2012 for operative treatment ofDegenerative arthritis of hip. Patient has severe unremitting pain that affects sleep, daily activities, and work/hobbies. After pre-op clearance the patient was taken to the operating room on 04/19/2012 and underwent  Procedure(s): TOTAL HIP ARTHROPLASTY ANTERIOR APPROACH.    Patient was given perioperative antibiotics: Anti-infectives     Start     Dose/Rate Route Frequency Ordered Stop   04/19/12 2200   ceFAZolin (ANCEF) IVPB 1 g/50 mL premix        1 g 100 mL/hr over 30 Minutes Intravenous Every 6 hours 04/19/12 1834 04/20/12 0438   04/19/12 1222   ceFAZolin (ANCEF) IVPB 1 g/50 mL premix        1 g 100 mL/hr over 30 Minutes Intravenous 60 min pre-op 04/19/12 1222 04/19/12 1538           Patient was given sequential compression devices, early ambulation, and chemoprophylaxis to prevent DVT.  Patient benefited maximally from hospital stay and there were no complications.    Recent vital signs: Patient Vitals for the past 24 hrs:  BP Temp Temp src Pulse Resp SpO2  04/22/12 0623 116/66 mmHg 98.4 F (36.9 C) Oral 77  15  95 %  04/23/12 2130 123/67 mmHg 99.3 F (37.4 C) Oral 82  16  96 %  April 23, 2012 1436 126/70 mmHg 98.4 F (36.9 C) Oral 82  16  98 %  04/23/12 0747 - - - - 16  99 %     Recent laboratory studies:  Basename 04/22/12 0415 2012-04-23 0442 04/20/12 0429  WBC  8.9 9.8 --  HGB 9.3* 10.2* --  HCT 28.5* 30.8* --  PLT 131* 153 --  NA -- -- 136  K -- -- 3.5  CL -- -- 103  CO2 -- -- 27  BUN -- -- 12  CREATININE -- -- 0.67  GLUCOSE -- -- 157*  INR -- -- --  CALCIUM -- -- 8.3*     Discharge Medications:   Medication List  As of 04/22/2012  6:59 AM   STOP taking these medications         naproxen sodium 220 MG tablet         TAKE these medications         aspirin 325 MG EC tablet   Take 1 tablet (325 mg total) by mouth 2 (two) times daily.      calcium-vitamin D 500-200 MG-UNIT per tablet   Commonly known as: OSCAL WITH D   Take 1 tablet by mouth daily.      cholecalciferol 1000 UNITS tablet   Commonly known as: VITAMIN D   Take 1,000 Units by mouth daily.      citalopram 40 MG tablet   Commonly known as: CELEXA   Take 40 mg by mouth See admin instructions. Takes 1/2 tablet every other day      estrogen (conjugated)-medroxyprogesterone 0.45-1.5 MG per  tablet   Commonly known as: PREMPRO   Take 1 tablet by mouth daily.      fish oil-omega-3 fatty acids 1000 MG capsule   Take 1 g by mouth daily.      methocarbamol 500 MG tablet   Commonly known as: ROBAXIN   Take 1 tablet (500 mg total) by mouth every 6 (six) hours as needed.      multivitamin with minerals Tabs   Take 1 tablet by mouth daily.      oxyCODONE-acetaminophen 5-325 MG per tablet   Commonly known as: PERCOCET   Take 1-2 tablets by mouth every 4 (four) hours as needed for pain.      SYSTANE OP   Place 1 drop into both eyes daily.            Diagnostic Studies: Dg Hip Complete Left  04/19/2012  *RADIOLOGY REPORT*  Clinical Data: Postop left hip arthroplasty.  OPERATIVE LEFT HIP - COMPLETE 2+ VIEW  Comparison: None.  Findings: Two spot images from the C-arm fluoroscopic device presented for interpretation post-operatively demonstrate left hip arthroplasty with anatomic alignment.  No visible acute complicating features.  IMPRESSION: Anatomic alignment post  left hip arthroplasty.  Original Report Authenticated By: Arnell Sieving, M.D.   Dg Pelvis Portable  04/19/2012  *RADIOLOGY REPORT*  Clinical Data: Left hip arthroplasty.  PORTABLE PELVIS  Comparison: Intraoperative films, same date.  Findings: The femoral and acetabular components are well seated. No complicating features.  IMPRESSION: Well seated components of a total left hip arthroplasty.  Original Report Authenticated By: P. Loralie Champagne, M.D.   Dg Hip Portable 1 View Left  04/19/2012  *RADIOLOGY REPORT*  Clinical Data: Left hip arthroplasty.  PORTABLE LEFT HIP - 1 VIEW  Comparison: None.  Findings: The femoral and acetabular components are well seated. No complicating features.  IMPRESSION: Well seated components of a total left hip arthroplasty.  Original Report Authenticated By: P. Loralie Champagne, M.D.   Dg C-arm 1-60 Min-no Report  04/19/2012  CLINICAL DATA: Surgery   C-ARM 1-60 MINUTES  Fluoroscopy was utilized by the requesting physician.  No radiographic  interpretation.      Disposition: to home  Discharge Orders    Future Orders Please Complete By Expires   Diet - low sodium heart healthy      Call MD / Call 911      Comments:   If you experience chest pain or shortness of breath, CALL 911 and be transported to the hospital emergency room.  If you develope a fever above 101 F, pus (white drainage) or increased drainage or redness at the wound, or calf pain, call your surgeon's office.   Constipation Prevention      Comments:   Drink plenty of fluids.  Prune juice may be helpful.  You may use a stool softener, such as Colace (over the counter) 100 mg twice a day.  Use MiraLax (over the counter) for constipation as needed.   Increase activity slowly as tolerated      Discharge instructions      Comments:   Increase your activities as comfort allows. You can get your current dressing wet in the shower.  You can get your actual incision wet starting 6/26, then dry dressing  daily. Expect left leg swelling. Follow-up at [facility] in 2 weeks [phone]. Over-the-counter stool softner as needed   Discharge patient            Signed: Kathryne Hitch 04/22/2012, 6:59 AM

## 2012-04-22 NOTE — Progress Notes (Signed)
Patient ID: [name], female   DOB: Jan 02, 1941, 71 y.o.   MRN: [id number] Looks good.  Can discharge to home today.

## 2012-04-22 NOTE — Progress Notes (Signed)
Pt for d/c home today with HHC per MD. IV d/c'd. Dressing CDI to L hip. Gauze & tegaderm dressing applied this am-CDI. Dressing supplies provided for home use. D/C instructions & RX's given with verbalized understanding. No changes in am assessments today. Awaiting for 3in 1 DME to be delivered prior to d/c. Friends at bedside to assist pt with d/c.

## 2012-04-22 NOTE — Progress Notes (Signed)
Physical Therapy Treatment Patient Details Name: [name] MRN: [id number] DOB: [birth year] Today's Date: 04/22/2012 Time: 4098-1191 PT Time Calculation (min): 29 min  PT Assessment / Plan / Recommendation Comments on Treatment Session  Pt doing very well with step through gait pattern, ambulation distance, stair training and exercises.  Ready for D/C today.     Follow Up Recommendations  Home health PT    Barriers to Discharge        Equipment Recommendations  3 in 1 bedside comode    Recommendations for Other Services    Frequency 7X/week   Plan Discharge plan remains appropriate    Precautions / Restrictions Precautions Precautions: None Restrictions Weight Bearing Restrictions: Yes LUE Weight Bearing: Weight bearing as tolerated   Pertinent Vitals/Pain 3/10    Mobility  Bed Mobility Bed Mobility: Supine to Sit Supine to Sit: 5: Supervision Details for Bed Mobility Assistance: Supervision for safety.  Pt doing very well wanting to perform bed mobility with HOB flat to better simulate home environment.  Transfers Transfers: Sit to Stand;Stand to Sit Sit to Stand: 5: Supervision;With upper extremity assist;From bed Stand to Sit: 5: Supervision;With armrests;To chair/3-in-1;With upper extremity assist Details for Transfer Assistance: Supervision for safety.  Pt demos good UE use and LE management when sitting/standing.  Ambulation/Gait Ambulation/Gait Assistance: 5: Supervision Ambulation Distance (Feet): 300 Feet Assistive device: Rolling walker Ambulation/Gait Assistance Details: Min cues for reciprical gait pattern.   Gait Pattern: Step-through pattern Stairs: Yes Stairs Assistance: 4: Min guard Stairs Assistance Details (indicate cue type and reason): Cues for sequencing/technique with RW, hand placement and education for friend that will be assisting her in house.  Provided handout for better carryover.  Stair Management Technique: No rails;Step to  pattern;Backwards;Forwards;With walker Number of Stairs: 4  (then 2 more)    Exercises Total Joint Exercises Hip ABduction/ADduction: AROM;Left;10 reps;Standing Knee Flexion: AROM;Left;10 reps;Standing Marching in Standing: AROM;Left;10 reps;Standing   PT Diagnosis:    PT Problem List:   PT Treatment Interventions:     PT Goals Acute Rehab PT Goals PT Goal Formulation: With patient Time For Goal Achievement: 04/25/12 Potential to Achieve Goals: Good Pt will go Supine/Side to Sit: with supervision PT Goal: Supine/Side to Sit - Progress: Met Pt will go Sit to Stand: with supervision PT Goal: Sit to Stand - Progress: Met Pt will go Stand to Sit: with supervision PT Goal: Stand to Sit - Progress: Met Pt will Ambulate: 51 - 150 feet;with supervision;with rolling walker PT Goal: Ambulate - Progress: Met Pt will Go Up / Down Stairs: 3-5 stairs;with min assist;with least restrictive assistive device PT Goal: Up/Down Stairs - Progress: Met  Visit Information  Last PT Received On: 04/22/12 Assistance Needed: +1    Subjective Data  Subjective: I'm ready to try the stairs.  Patient Stated Goal: Resume previous lifestyle with decreaed pain   Cognition  Overall Cognitive Status: Appears within functional limits for tasks assessed/performed Arousal/Alertness: Awake/alert Orientation Level: Appears intact for tasks assessed Behavior During Session: [facility] for tasks performed    Balance     End of Session PT - End of Session Activity Tolerance: Patient tolerated treatment well Patient left: in chair;with call bell/phone within reach;with family/visitor present Nurse Communication: Mobility status;Other (comment)    Page, Meribeth Mattes 04/22/2012, 8:40 AM

## 2012-04-22 NOTE — Progress Notes (Signed)
CARE MANAGEMENT NOTE 04/22/2012  Patient:  [name], [name]   Account Number:  [id number]  Date Initiated:  04/20/2012  Documentation initiated by:  DAVIS,TYMEEKA  Subjective/Objective Assessment:   71 yo female admitted s/p left total hip replacement. PTA pt lived alone.     Action/Plan:   home when stable   Anticipated DC Date:  04/22/2012   Anticipated DC Plan:  HOME W HOME HEALTH SERVICES  In-house referral  NA      DC Planning Services  CM consult      [facility] Choice  HOME HEALTH   Choice offered to / List presented to:  C-1 Patient   DME arranged  NA      DME agency  NA     HH arranged  HH-2 PT      [facility] agency  [facility]   Status of service:  Completed, signed off Medicare Important Message given?  NA - LOS <3 / Initial given by admissions (If response is "NO", the following Medicare IM given date fields will be blank)   Discharge Disposition:  HOME W HOME HEALTH SERVICES    Comments:  04/22/2013 Raynelle Bring BSN CCM [phone] pT FOR DISCHARGE TODAY/hh SERVICES WILL START TOMORROW WITH [facility]/DME ARRANGED BY [facility] AND WILL BE DELIVERED TO PATIENT'S HOME

## 2012-04-22 NOTE — Progress Notes (Signed)
Occupational Therapy Treatment Patient Details Name: [name] MRN: [id number] DOB: 1941/07/21 Today's Date: 04/22/2012 Time: 5621-3086 OT Time Calculation (min): 17 min  OT Assessment / Plan / Recommendation Comments on Treatment Session Pt supposed to discharge today. Doing well and would recommend HHOT to followup for increasing her independence further with ADL.    Follow Up Recommendations  Home health OT    Barriers to Discharge       Equipment Recommendations  3 in 1 bedside comode    Recommendations for Other Services    Frequency Min 2X/week   Plan Discharge plan remains appropriate    Precautions / Restrictions Precautions Precautions: None Restrictions Weight Bearing Restrictions: Yes LUE Weight Bearing: Weight bearing as tolerated        ADL  Lower Body Bathing: Simulated;Minimal assistance;Other (comment) (simulated with LHS) Where Assessed - Lower Body Bathing: Supported sit to stand Lower Body Dressing: Performed;Minimal assistance;Other (comment) (with reacher to don underwear and sock aid to don socks. ) Where Assessed - Lower Body Dressing: Sopported sit to stand Toilet Transfer: Simulated;Min guard Toilet Transfer Method: Other (comment) (ambulating) Tub/Shower Transfer: Simulated;Minimal assistance Tub/Shower Transfer Method: Other (comment) (simulate step back over shower ledge) Equipment Used: Rolling walker;Long-handled shoe horn;Long-handled sponge;Reacher;Sock aid ADL Comments: Reviewed all AE and pt practiced with reacher to doff socks, sock aid to don socks and reacher to don underwear. Simulated with LHS for LB ADL. pt has all AE already. Pt doing well. Some difficulty with clearing L LE over shower ledge so advised to sponge bathe until pt able to lift L LE to clear ledge well. Also discussed placement of 3in1 as shower chair in shower for most safety.    OT Diagnosis:    OT Problem List:   OT Treatment Interventions:     OT Goals ADL  Goals ADL Goal: Lower Body Bathing - Progress: Progressing toward goals ADL Goal: Lower Body Dressing - Progress: Progressing toward goals ADL Goal: Toilet Transfer - Progress: Progressing toward goals Pt Will Perform Tub/Shower Transfer: with supervision ADL Goal: Tub/Shower Transfer - Progress: Progressing toward goals  Visit Information  Last OT Received On: 04/22/12 Assistance Needed: +1    Subjective Data  Subjective: I used the sock thing some yesterday Patient Stated Goal: to go home   Prior Functioning       Cognition  Overall Cognitive Status: Appears within functional limits for tasks assessed/performed Arousal/Alertness: Awake/alert Orientation Level: Appears intact for tasks assessed Behavior During Session: [facility] for tasks performed    Mobility Bed Mobility Bed Mobility: Supine to Sit Supine to Sit: 5: Supervision Details for Bed Mobility Assistance: Supervision for safety.  Pt doing very well wanting to perform bed mobility with HOB flat to better simulate home environment.  Transfers Transfers: Sit to Stand;Stand to Sit Sit to Stand: 5: Supervision;With upper extremity assist Stand to Sit: 5: Supervision;With upper extremity assist Details for Transfer Assistance: pt doing well with using UEs for assistance with standing and sitting.   Exercises Total Joint Exercises Hip ABduction/ADduction: AROM;Left;10 reps;Standing Knee Flexion: AROM;Left;10 reps;Standing Marching in Standing: AROM;Left;10 reps;Standing  Balance    End of Session OT - End of Session Activity Tolerance: Patient tolerated treatment well Patient left: in chair;with call bell/phone within reach;with family/visitor present   Lennox Laity [phone] 04/22/2012, 9:59 AM

## 2012-04-24 ENCOUNTER — Encounter (HOSPITAL_COMMUNITY): Payer: Self-pay | Admitting: Orthopaedic Surgery

## 2012-07-09 ENCOUNTER — Other Ambulatory Visit: Payer: Self-pay | Admitting: *Deleted

## 2012-07-09 DIAGNOSIS — Z79890 Hormone replacement therapy (postmenopausal): Secondary | ICD-10-CM

## 2012-07-09 MED ORDER — CONJ ESTROG-MEDROXYPROGEST ACE 0.45-1.5 MG PO TABS: 1.0000 | ORAL_TABLET | Freq: Every day | ORAL | Status: DC

## 2012-08-01 ENCOUNTER — Encounter: Payer: Medicare Other | Admitting: Gynecology

## 2012-08-08 ENCOUNTER — Encounter: Payer: Self-pay | Admitting: Gynecology

## 2012-08-08 ENCOUNTER — Ambulatory Visit (INDEPENDENT_AMBULATORY_CARE_PROVIDER_SITE_OTHER): Payer: Medicare Other | Admitting: Gynecology

## 2012-08-08 VITALS — BP 142/76 | Ht 66.0 in | Wt 154.0 lb

## 2012-08-08 DIAGNOSIS — M949 Disorder of cartilage, unspecified: Secondary | ICD-10-CM

## 2012-08-08 DIAGNOSIS — Z79890 Hormone replacement therapy (postmenopausal): Secondary | ICD-10-CM

## 2012-08-08 DIAGNOSIS — M899 Disorder of bone, unspecified: Secondary | ICD-10-CM

## 2012-08-08 DIAGNOSIS — M8580 Other specified disorders of bone density and structure, unspecified site: Secondary | ICD-10-CM

## 2012-08-08 DIAGNOSIS — N952 Postmenopausal atrophic vaginitis: Secondary | ICD-10-CM

## 2012-08-08 MED ORDER — CONJ ESTROG-MEDROXYPROGEST ACE 0.45-1.5 MG PO TABS: 1.0000 | ORAL_TABLET | Freq: Every day | ORAL | Status: DC

## 2012-08-08 NOTE — Progress Notes (Signed)
[name] Dec 10, 1940 [id number]        71 y.o.  G2P2 for annual follow up exam.  Several issues noted below.  Past medical history,surgical history, medications, allergies, family history and social history were all reviewed and documented in the EPIC chart. ROS:  Was performed and pertinent positives and negatives are included in the history.  Exam: [name] assistant Filed Vitals:   08/08/12 1011  BP: 142/76  Height: 5\' 6"  (1.676 m)  Weight: 154 lb (69.854 kg)   General appearance  Normal Skin grossly normal Head/Neck normal with no cervical or supraclavicular adenopathy thyroid normal Lungs  clear Cardiac RR, without RMG Abdominal  soft, nontender, without masses, organomegaly or hernia Breasts  examined lying and sitting without masses, retractions, discharge or axillary adenopathy. Pelvic  Ext/BUS/vagina  normal with atrophic changes  Cervix  normal with atrophic changes  Uterus  axial, normal size, shape and contour, midline and mobile nontender   Adnexa  Without masses or tenderness    Anus and perineum  normal   Rectovaginal  normal sphincter tone without palpated masses or tenderness.    Assessment/Plan:  71 y.o. G2P2 female for follow up exam.   1. HRT. Patient continues on Prempro 0.45/1.5. Doing well without complaints. No bleeding. We again reviewed HRT to include the WHI study with increased risk of stroke, heart attack, DVT and breast cancer. The ACOG/NAMS statements for lowest is the shortest period of time reviewed. Patient understands the issues, is feeling well wants to continue I refilled her times a year.  Report any bleeding or issues. 2. Mammography. Patient is due in November and I reminded her to schedule this. SBE monthly reviewed. 3. Pap smear.  No history of abnormal Pap smears before. No Pap smear done today as she is over the is age of 44 and we have stopped screening. 4. Colonoscopy. 2010. She will follow up at recommended interval. 5. Osteopenia.  Had DEXA last year per her history. No access to these records and she is following up with Dr. Pete Glatter in reference to this. Increase calcium vitamin D reviewed.  6. Health maintenance. No lab work done today as is all done through Dr. Laverle Hobby office. Follow up one year, sooner as needed.    Dara Lords MD, 10:47 AM 08/08/2012

## 2012-08-08 NOTE — Patient Instructions (Signed)
Follow up in one year for annual exam. Report any vaginal bleeding.  Hormone Therapy At menopause, your body begins making less estrogen and progesterone hormones. This causes the body to stop having menstrual periods. This is because estrogen and progesterone hormones control your periods and menstrual cycle. A lack of estrogen may cause symptoms such as:  Hot flushes (or hot flashes).  Vaginal dryness.  Dry skin.  Loss of sex drive.  Risk of bone loss (osteoporosis). When this happens, you may choose to take hormone therapy to get back the estrogen lost during menopause. When the hormone estrogen is given alone, it is usually referred to as ET (Estrogen Therapy). When the hormone progestin is combined with estrogen, it is generally called HT (Hormone Therapy). This was formerly known as hormone replacement therapy (HRT). Your caregiver can help you make a decision on what will be best for you. The decision to use HT seems to change often as new studies are done. Many studies do not agree on the benefits of hormone replacement therapy. LIKELY BENEFITS OF HT INCLUDE PROTECTION FROM:  Hot Flushes (also called hot flashes) - A hot flush is a sudden feeling of heat that spreads over the face and body. The skin may redden like a blush. It is connected with sweats and sleep disturbance. Women going through menopause may have hot flushes a few times a month or several times per day depending on the woman.  Osteoporosis (bone loss)- Estrogen helps guard against bone loss. After menopause, a woman's bones slowly lose calcium and become weak and brittle. As a result, bones are more likely to break. The hip, wrist, and spine are affected most often. Hormone therapy can help slow bone loss after menopause. Weight bearing exercise and taking calcium with vitamin D also can help prevent bone loss. There are also medications that your caregiver can prescribe that can help prevent osteoporosis.  Vaginal  Dryness - Loss of estrogen causes changes in the vagina. Its lining may become thin and dry. These changes can cause pain and bleeding during sexual intercourse. Dryness can also lead to infections. This can cause burning and itching. (Vaginal estrogen treatment can help relieve pain, itching, and dryness.)  Urinary Tract Infections are more common after menopause because of lack of estrogen. Some women also develop urinary incontinence because of low estrogen levels in the vagina and bladder.  Possible other benefits of estrogen include a positive effect on mood and short-term memory in women. RISKS AND COMPLICATIONS  Using estrogen alone without progesterone causes the lining of the uterus to grow. This increases the risk of lining of the uterus (endometrial) cancer. Your caregiver should give another hormone called progestin if you have a uterus.  Women who take combined (estrogen and progestin) HT appear to have an increased risk of breast cancer. The risk appears to be small, but increases throughout the time that HT is taken.  Combined therapy also makes the breast tissue slightly denser which makes it harder to read mammograms (breast X-rays).  Combined, estrogen and progesterone therapy can be taken together every day, in which case there may be spotting of blood. HT therapy can be taken cyclically in which case you will have menstrual periods. Cyclically means HT is taken for a set amount of days, then not taken, then this process is repeated.  HT may increase the risk of stroke, heart attack, breast cancer and forming blood clots in your leg.  Transdermal estrogen (estrogen that is absorbed through the  skin with a patch or a cream) may have more positive results with:  Cholesterol.  Blood pressure.  Blood clots. Having the following conditions may indicate you should not have HT:  Endometrial cancer.  Liver disease.  Breast cancer.  Heart disease.  History of blood  clots.  Stroke. TREATMENT   If you choose to take HT and have a uterus, usually estrogen and progestin are prescribed.  Your caregiver will help you decide the best way to take the medications.  Possible ways to take estrogen include:  Pills.  Patches.  Gels.  Sprays.  Vaginal estrogen cream, rings and tablets.  It is best to take the lowest dose possible that will help your symptoms and take them for the shortest period of time that you can.  Hormone therapy can help relieve some of the problems (symptoms) that affect women at menopause. Before making a decision about HT, talk to your caregiver about what is best for you. Be well informed and comfortable with your decisions. HOME CARE INSTRUCTIONS   Follow your caregivers advice when taking the medications.  A Pap test is done to screen for cervical cancer.  The first Pap test should be done at age 33.  Between ages 33 and 15, Pap tests are repeated every 2 years.  Beginning at age 62, you are advised to have a Pap test every 3 years as long as your past 3 Pap tests have been normal.  Some women have medical problems that increase the chance of getting cervical cancer. Talk to your caregiver about these problems. It is especially important to talk to your caregiver if a new problem develops soon after your last Pap test. In these cases, your caregiver may recommend more frequent screening and Pap tests.  The above recommendations are the same for women who have or have not gotten the vaccine for HPV (Human Papillomavirus).  If you had a hysterectomy for a problem that was not a cancer or a condition that could lead to cancer, then you no longer need Pap tests. However, even if you no longer need a Pap test, a regular exam is a good idea to make sure no other problems are starting.   If you are between ages 49 and 37, and you have had normal Pap tests going back 10 years, you no longer need Pap tests. However, even if you  no longer need a Pap test, a regular exam is a good idea to make sure no other problems are starting.   If you have had past treatment for cervical cancer or a condition that could lead to cancer, you need Pap tests and screening for cancer for at least 20 years after your treatment.  If Pap tests have been discontinued, risk factors (such as a new sexual partner) need to be re-assessed to determine if screening should be resumed.  Some women may need screenings more often if they are at high risk for cervical cancer.  Get mammograms done as per the advice of your caregiver. SEEK IMMEDIATE MEDICAL CARE IF:  You develop abnormal vaginal bleeding.  You have pain or swelling in your legs, shortness of breath, or chest pain.  You develop dizziness or headaches.  You have lumps or changes in your breasts or armpits.  You have slurred speech.  You develop weakness or numbness of your arms or legs.  You have pain, burning, or bleeding when urinating.  You develop abdominal pain. Document Released: 07/15/2003 Document Revised: 01/08/2012 Document  Reviewed: 11/02/2010 [employer] Patient Information 2013 [place], [place].

## 2012-08-15 ENCOUNTER — Other Ambulatory Visit: Payer: Self-pay | Admitting: Geriatric Medicine

## 2012-08-15 DIAGNOSIS — Z1231 Encounter for screening mammogram for malignant neoplasm of breast: Secondary | ICD-10-CM

## 2012-09-20 ENCOUNTER — Ambulatory Visit
Admission: RE | Admit: 2012-09-20 | Discharge: 2012-09-20 | Disposition: A | Discharge status: Home / Self Care | Payer: Medicare Other | Source: Ambulatory Visit | Attending: Geriatric Medicine | Admitting: Geriatric Medicine

## 2012-09-20 DIAGNOSIS — Z1231 Encounter for screening mammogram for malignant neoplasm of breast: Secondary | ICD-10-CM

## 2012-09-24 ENCOUNTER — Other Ambulatory Visit: Payer: Self-pay | Admitting: Geriatric Medicine

## 2012-09-24 DIAGNOSIS — R928 Other abnormal and inconclusive findings on diagnostic imaging of breast: Secondary | ICD-10-CM

## 2012-10-02 ENCOUNTER — Ambulatory Visit
Admission: RE | Admit: 2012-10-02 | Discharge: 2012-10-02 | Disposition: A | Discharge status: Home / Self Care | Payer: Medicare Other | Source: Ambulatory Visit | Attending: Geriatric Medicine | Admitting: Geriatric Medicine

## 2012-10-02 DIAGNOSIS — R928 Other abnormal and inconclusive findings on diagnostic imaging of breast: Secondary | ICD-10-CM

## 2013-02-26 ENCOUNTER — Other Ambulatory Visit: Payer: Self-pay | Admitting: Geriatric Medicine

## 2013-02-26 DIAGNOSIS — R921 Mammographic calcification found on diagnostic imaging of breast: Secondary | ICD-10-CM

## 2013-03-10 ENCOUNTER — Ambulatory Visit
Admission: RE | Admit: 2013-03-10 | Discharge: 2013-03-10 | Disposition: A | Discharge status: Home / Self Care | Payer: Medicare Other | Source: Ambulatory Visit | Attending: Geriatric Medicine | Admitting: Geriatric Medicine

## 2013-03-10 DIAGNOSIS — R921 Mammographic calcification found on diagnostic imaging of breast: Secondary | ICD-10-CM

## 2013-05-21 ENCOUNTER — Other Ambulatory Visit: Payer: Self-pay | Admitting: Dermatology

## 2013-08-19 ENCOUNTER — Other Ambulatory Visit: Payer: Self-pay | Admitting: Geriatric Medicine

## 2013-08-19 DIAGNOSIS — R921 Mammographic calcification found on diagnostic imaging of breast: Secondary | ICD-10-CM

## 2013-09-12 ENCOUNTER — Ambulatory Visit
Admission: RE | Admit: 2013-09-12 | Discharge: 2013-09-12 | Disposition: A | Discharge status: Home / Self Care | Payer: Medicare Other | Source: Ambulatory Visit | Attending: Geriatric Medicine | Admitting: Geriatric Medicine

## 2013-09-12 DIAGNOSIS — R921 Mammographic calcification found on diagnostic imaging of breast: Secondary | ICD-10-CM

## 2014-08-14 ENCOUNTER — Other Ambulatory Visit: Payer: Self-pay | Admitting: Geriatric Medicine

## 2014-08-14 DIAGNOSIS — R921 Mammographic calcification found on diagnostic imaging of breast: Secondary | ICD-10-CM

## 2014-08-31 ENCOUNTER — Encounter: Payer: Self-pay | Admitting: Gynecology

## 2014-09-17 ENCOUNTER — Ambulatory Visit
Admission: RE | Admit: 2014-09-17 | Discharge: 2014-09-17 | Disposition: A | Discharge status: Home / Self Care | Payer: Medicare Other | Source: Ambulatory Visit | Attending: Geriatric Medicine | Admitting: Geriatric Medicine

## 2014-09-17 DIAGNOSIS — R921 Mammographic calcification found on diagnostic imaging of breast: Secondary | ICD-10-CM

## 2015-08-20 ENCOUNTER — Other Ambulatory Visit: Payer: Self-pay

## 2015-08-20 DIAGNOSIS — Z1231 Encounter for screening mammogram for malignant neoplasm of breast: Secondary | ICD-10-CM

## 2015-09-07 ENCOUNTER — Other Ambulatory Visit: Payer: Self-pay | Admitting: Nurse Practitioner

## 2015-09-07 ENCOUNTER — Ambulatory Visit
Admission: RE | Admit: 2015-09-07 | Discharge: 2015-09-07 | Disposition: A | Discharge status: Home / Self Care | Payer: Medicare Other | Source: Ambulatory Visit | Attending: Nurse Practitioner | Admitting: Nurse Practitioner

## 2015-09-07 DIAGNOSIS — R0781 Pleurodynia: Secondary | ICD-10-CM

## 2015-09-22 ENCOUNTER — Ambulatory Visit: Payer: Self-pay

## 2015-09-27 ENCOUNTER — Ambulatory Visit
Admission: RE | Admit: 2015-09-27 | Discharge: 2015-09-27 | Disposition: A | Discharge status: Home / Self Care | Payer: Medicare Other | Source: Ambulatory Visit

## 2015-09-27 DIAGNOSIS — Z1231 Encounter for screening mammogram for malignant neoplasm of breast: Secondary | ICD-10-CM

## 2015-09-29 ENCOUNTER — Ambulatory Visit (INDEPENDENT_AMBULATORY_CARE_PROVIDER_SITE_OTHER): Payer: Medicare Other

## 2015-09-29 ENCOUNTER — Ambulatory Visit (INDEPENDENT_AMBULATORY_CARE_PROVIDER_SITE_OTHER): Payer: Medicare Other | Admitting: Podiatry

## 2015-09-29 VITALS — Resp 16 | Ht 66.0 in | Wt 154.0 lb

## 2015-09-29 DIAGNOSIS — M79672 Pain in left foot: Secondary | ICD-10-CM | POA: Diagnosis not present

## 2015-09-29 DIAGNOSIS — M205X1 Other deformities of toe(s) (acquired), right foot: Secondary | ICD-10-CM | POA: Diagnosis not present

## 2015-09-29 NOTE — Progress Notes (Signed)
   Subjective:    Patient ID: [name], female    DOB: October 14, 1941, 74 y.o.   MRN: [id number]  HPI Patient presents with foot pain in their left foot, great toe. Pt stated, "big toe gets stiff when walking; gets throbbing in big toe in the middle of the night". This has been going on for the past 2 years.   Review of Systems  All other systems reviewed and are negative.      Objective:   Physical Exam        Assessment & Plan:

## 2015-09-29 NOTE — Progress Notes (Signed)
Subjective:     Patient ID: [name], female   DOB: 1941/08/27, 74 y.o.   MRN: [id number]  HPI patient presents stating I'm having a lot of problems my big toe joint on my left foot and I know it's arthritis and I haven't in my knees and my shoulder also. It's been present for a number years and worse the last couple years   Review of Systems  All other systems reviewed and are negative.      Objective:   Physical Exam  Constitutional: She is oriented to person, place, and time.  Cardiovascular: Intact distal pulses.   Musculoskeletal: Normal range of motion.  Neurological: She is oriented to person, place, and time.  Skin: Skin is warm.  Nursing note and vitals reviewed.  neurovascular status intact muscle strength adequate range of motion within normal limits with thickness and pain around the first MPJ left with almost no dorsiflexion noted and only several degrees of plantarflexion. With crepitus within the joint noted. 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 I do think best procedure would be implant arthroplasty and I reviewed this with patient and she wants this over any other treatments. She will reappoint January his tenably scheduled mid January for procedure and we will review it in great length at that time and today I did educate her at great length on surgery

## 2015-09-29 NOTE — Patient Instructions (Signed)
Pre-Operative Instructions  Congratulations, you have decided to take an important step to improving your quality of life.  You can be assured that the doctors of [facility] will be with you every step of the way.  1. Plan to be at the surgery center/hospital at least 1 (one) hour prior to your scheduled time unless otherwise directed by the surgical center/hospital staff.  You must have a responsible adult accompany you, remain during the surgery and drive you home.  Make sure you have directions to the surgical center/hospital and know how to get there on time. 2. For hospital based surgery you will need to obtain a history and physical form from your family physician within 1 month prior to the date of surgery- we will give you a form for you primary physician.  3. We make every effort to accommodate the date you request for surgery.  There are however, times where surgery dates or times have to be moved.  We will contact you as soon as possible if a change in schedule is required.   4. No Aspirin/Ibuprofen for one week before surgery.  If you are on aspirin, any non-steroidal anti-inflammatory medications (Mobic, Aleve, Ibuprofen) you should stop taking it 7 days prior to your surgery.  You make take Tylenol  For pain prior to surgery.  5. Medications- If you are taking daily heart and blood pressure medications, seizure, reflux, allergy, asthma, anxiety, pain or diabetes medications, make sure the surgery center/hospital is aware before the day of surgery so they may notify you which medications to take or avoid the day of surgery. 6. No food or drink after midnight the night before surgery unless directed otherwise by surgical center/hospital staff. 7. No alcoholic beverages 24 hours prior to surgery.  No smoking 24 hours prior to or 24 hours after surgery. 8. Wear loose pants or shorts- loose enough to fit over bandages, boots, and casts. 9. No slip on shoes, sneakers are best. 10. Bring  your boot with you to the surgery center/hospital.  Also bring crutches or a walker if your physician has prescribed it for you.  If you do not have this equipment, it will be provided for you after surgery. 11. If you have not been contracted by the surgery center/hospital by the day before your surgery, call to confirm the date and time of your surgery. 12. Leave-time from work may vary depending on the type of surgery you have.  Appropriate arrangements should be made prior to surgery with your employer. 13. Prescriptions will be provided immediately following surgery by your doctor.  Have these filled as soon as possible after surgery and take the medication as directed. 14. Remove nail polish on the operative foot. 15. Wash the night before surgery.  The night before surgery wash the foot and leg well with the antibacterial soap provided and water paying special attention to beneath the toenails and in between the toes.  Rinse thoroughly with water and dry well with a towel.  Perform this wash unless told not to do so by your physician.  Enclosed: 1 Ice pack (please put in freezer the night before surgery)   1 Hibiclens skin cleaner   Pre-op Instructions  If you have any questions regarding the instructions, do not hesitate to call our office.  [place]: [address] [place], [place] [address] [phone]  [place]: [address] [place], [place] [address] [phone]  [place]: [address]  [place], [place] [address] [phone]  Dr. Richard   Tuchman DPM, Dr. Norman Regal DPM Dr. Richard Sikora DPM, Dr. M. Todd Hyatt DPM, Dr. Kathryn Egerton DPM 

## 2015-10-15 DIAGNOSIS — M1812 Unilateral primary osteoarthritis of first carpometacarpal joint, left hand: Secondary | ICD-10-CM | POA: Insufficient documentation

## 2015-10-29 ENCOUNTER — Telehealth: Payer: Self-pay | Admitting: *Deleted

## 2015-10-29 NOTE — Telephone Encounter (Signed)
According to [name], patient called and stated she wanted to cancel surgery.  I attempted to call patient to see if she wanted to reschedule.  I left her a message to call me back if she would like to reschedule her appointment.  I called and canceled surgery with Renee at [facility]

## 2015-11-03 ENCOUNTER — Ambulatory Visit: Payer: Medicare Other | Admitting: Podiatry

## 2015-11-03 NOTE — Telephone Encounter (Signed)
I want to cancel my appointment for 01/05 with Dr. Paulla Dolly and cancel surgery.

## 2015-11-26 DIAGNOSIS — M1812 Unilateral primary osteoarthritis of first carpometacarpal joint, left hand: Secondary | ICD-10-CM | POA: Diagnosis not present

## 2015-12-07 DIAGNOSIS — W5503XA Scratched by cat, initial encounter: Secondary | ICD-10-CM | POA: Diagnosis not present

## 2015-12-07 DIAGNOSIS — M79641 Pain in right hand: Secondary | ICD-10-CM | POA: Diagnosis not present

## 2015-12-07 DIAGNOSIS — T148 Other injury of unspecified body region: Secondary | ICD-10-CM | POA: Diagnosis not present

## 2015-12-21 DIAGNOSIS — F329 Major depressive disorder, single episode, unspecified: Secondary | ICD-10-CM | POA: Diagnosis not present

## 2016-01-12 DIAGNOSIS — L570 Actinic keratosis: Secondary | ICD-10-CM | POA: Diagnosis not present

## 2016-01-12 DIAGNOSIS — Z23 Encounter for immunization: Secondary | ICD-10-CM | POA: Diagnosis not present

## 2016-02-02 DIAGNOSIS — M8580 Other specified disorders of bone density and structure, unspecified site: Secondary | ICD-10-CM | POA: Diagnosis not present

## 2016-02-02 DIAGNOSIS — F329 Major depressive disorder, single episode, unspecified: Secondary | ICD-10-CM | POA: Diagnosis not present

## 2016-02-02 DIAGNOSIS — Z79899 Other long term (current) drug therapy: Secondary | ICD-10-CM | POA: Diagnosis not present

## 2016-02-02 DIAGNOSIS — Z0000 Encounter for general adult medical examination without abnormal findings: Secondary | ICD-10-CM | POA: Diagnosis not present

## 2016-02-02 DIAGNOSIS — D692 Other nonthrombocytopenic purpura: Secondary | ICD-10-CM | POA: Diagnosis not present

## 2016-02-02 DIAGNOSIS — E7800 Pure hypercholesterolemia, unspecified: Secondary | ICD-10-CM | POA: Diagnosis not present

## 2016-02-02 DIAGNOSIS — R251 Tremor, unspecified: Secondary | ICD-10-CM | POA: Diagnosis not present

## 2016-02-07 DIAGNOSIS — M25551 Pain in right hip: Secondary | ICD-10-CM | POA: Diagnosis not present

## 2016-03-14 DIAGNOSIS — M1611 Unilateral primary osteoarthritis, right hip: Secondary | ICD-10-CM | POA: Diagnosis not present

## 2016-04-10 ENCOUNTER — Other Ambulatory Visit: Payer: Self-pay | Admitting: Physician Assistant

## 2016-04-12 ENCOUNTER — Encounter (HOSPITAL_COMMUNITY): Payer: Self-pay

## 2016-04-13 DIAGNOSIS — D485 Neoplasm of uncertain behavior of skin: Secondary | ICD-10-CM | POA: Diagnosis not present

## 2016-04-13 NOTE — Patient Instructions (Addendum)
[name]  04/13/2016   Your procedure is scheduled on: 04-21-16  Report to [facility] Main  Entrance take [facility]  elevators to 3rd floor to  [name] at   [place] AM.  Call this number if you have problems the morning of surgery [phone]   Remember: ONLY 1 PERSON MAY GO WITH YOU TO SHORT STAY TO GET  READY MORNING OF [name].  Do not eat food or drink liquids :After Midnight.     Take these medicines the morning of surgery with A SIP OF WATER: Tramadol-if need. Eye drops-usual. DO NOT TAKE ANY DIABETIC MEDICATIONS DAY OF YOUR SURGERY                               You may not have any metal on your body including hair pins and              piercings  Do not wear jewelry, make-up, lotions, powders or perfumes, deodorant             Do not wear nail polish.  Do not shave  48 hours prior to surgery.              Men may shave face and neck.   Do not bring valuables to the hospital. Jet.  Contacts, dentures or bridgework may not be worn into surgery.  Leave suitcase in the car. After surgery it may be brought to your room.     Patients discharged the day of surgery will not be allowed to drive home.  Name and phone number of your driver: [name]- friend [phone] cell  Special Instructions: N/A              Please read over the following fact sheets you were given: _____________________________________________________________________             [facility] - Preparing for Surgery Before surgery, you can play an important role.  Because skin is not sterile, your skin needs to be as free of germs as possible.  You can reduce the number of germs on your skin by washing with CHG (chlorahexidine gluconate) soap before surgery.  CHG is an antiseptic cleaner which kills germs and bonds with the skin to continue killing germs even after washing. Please DO NOT use if you have an allergy to CHG  or antibacterial soaps.  If your skin becomes reddened/irritated stop using the CHG and inform your nurse when you arrive at Short Stay. Do not shave (including legs and underarms) for at least 48 hours prior to the first CHG shower.  You may shave your face/neck. Please follow these instructions carefully:  1.  Shower with CHG Soap the night before surgery and the  morning of Surgery.  2.  If you choose to wash your hair, wash your hair first as usual with your  normal  shampoo.  3.  After you shampoo, rinse your hair and body thoroughly to remove the  shampoo.                           4.  Use CHG as you would any other liquid soap.  You can apply chg directly  to the skin and wash                       Gently with a scrungie or clean washcloth.  5.  Apply the CHG Soap to your body ONLY FROM THE NECK DOWN.   Do not use on face/ open                           Wound or open sores. Avoid contact with eyes, ears mouth and genitals (private parts).                       Wash face,  Genitals (private parts) with your normal soap.             6.  Wash thoroughly, paying special attention to the area where your surgery  will be performed.  7.  Thoroughly rinse your body with warm water from the neck down.  8.  DO NOT shower/wash with your normal soap after using and rinsing off  the CHG Soap.                9.  Pat yourself dry with a clean towel.            10.  Wear clean pajamas.            11.  Place clean sheets on your bed the night of your first shower and do not  sleep with pets. Day of Surgery : Do not apply any lotions/deodorants the morning of surgery.  Please wear clean clothes to the hospital/surgery center.  FAILURE TO FOLLOW THESE INSTRUCTIONS MAY RESULT IN THE CANCELLATION OF YOUR SURGERY PATIENT SIGNATURE_________________________________  NURSE SIGNATURE__________________________________  ________________________________________________________________________  [name]  An incentive spirometer is a tool that can help keep your lungs clear and active. This tool measures how well you are filling your lungs with each breath. Taking long deep breaths may help reverse or decrease the chance of developing breathing (pulmonary) problems (especially infection) following:  A long period of time when you are unable to move or be active. BEFORE THE PROCEDURE   If the spirometer includes an indicator to show your best effort, your nurse or respiratory therapist will set it to a desired goal.  If possible, sit up straight or lean slightly forward. Try not to slouch.  Hold the incentive spirometer in an upright position. INSTRUCTIONS FOR USE  1. Sit on the edge of your bed if possible, or sit up as far as you can in bed or on a chair. 2. Hold the incentive spirometer in an upright position. 3. Breathe out normally. 4. Place the mouthpiece in your mouth and seal your lips tightly around it. 5. Breathe in slowly and as deeply as possible, raising the piston or the ball toward the top of the column. 6. Hold your breath for 3-5 seconds or for as long as possible. Allow the piston or ball to fall to the bottom of the column. 7. Remove the mouthpiece from your mouth and breathe out normally. 8. Rest for a few seconds and repeat Steps 1 through 7 at least 10 times every 1-2 hours when you are awake. Take your time and take a few normal breaths between deep breaths. 9. The spirometer may include an indicator to show your best effort. Use the indicator as a goal to work toward during each repetition. 10. After each set  of 10 deep breaths, practice coughing to be sure your lungs are clear. If you have an incision (the cut made at the time of surgery), support your incision when coughing by placing a pillow or rolled up towels firmly against it. Once you are able to get out of bed, walk around indoors and cough well. You may stop using the incentive spirometer when  instructed by your caregiver.  RISKS AND COMPLICATIONS  Take your time so you do not get dizzy or light-headed.  If you are in pain, you may need to take or ask for pain medication before doing incentive spirometry. It is harder to take a deep breath if you are having pain. AFTER USE  Rest and breathe slowly and easily.  It can be helpful to keep track of a log of your progress. Your caregiver can provide you with a simple table to help with this. If you are using the spirometer at home, follow these instructions: [place] IF:   You are having difficultly using the spirometer.  You have trouble using the spirometer as often as instructed.  Your pain medication is not giving enough relief while using the spirometer.  You develop fever of 100.5 F (38.1 C) or higher. SEEK IMMEDIATE MEDICAL CARE IF:   You cough up bloody sputum that had not been present before.  You develop fever of 102 F (38.9 C) or greater.  You develop worsening pain at or near the incision site. MAKE SURE YOU:   Understand these instructions.  Will watch your condition.  Will get help right away if you are not doing well or get worse. Document Released: 02/26/2007 Document Revised: 01/08/2012 Document Reviewed: 04/29/2007 [facility] Patient Information 2014 [place], [place].   ________________________________________________________________________

## 2016-04-14 ENCOUNTER — Encounter (HOSPITAL_COMMUNITY): Payer: Self-pay

## 2016-04-14 ENCOUNTER — Encounter (HOSPITAL_COMMUNITY)
Admission: RE | Admit: 2016-04-14 | Discharge: 2016-04-14 | Disposition: A | Discharge status: Home / Self Care | Payer: PPO | Source: Ambulatory Visit | Attending: Orthopaedic Surgery | Admitting: Orthopaedic Surgery

## 2016-04-14 DIAGNOSIS — M1611 Unilateral primary osteoarthritis, right hip: Secondary | ICD-10-CM | POA: Diagnosis not present

## 2016-04-14 DIAGNOSIS — Z0183 Encounter for blood typing: Secondary | ICD-10-CM | POA: Insufficient documentation

## 2016-04-14 DIAGNOSIS — Z01812 Encounter for preprocedural laboratory examination: Secondary | ICD-10-CM | POA: Diagnosis not present

## 2016-04-14 HISTORY — DX: Other complications of anesthesia, initial encounter: T88.59XA

## 2016-04-14 HISTORY — DX: Adverse effect of unspecified anesthetic, initial encounter: T41.45XA

## 2016-04-14 HISTORY — DX: Dorsalgia, unspecified: M54.9

## 2016-04-14 HISTORY — DX: Personal history of urinary calculi: Z87.442

## 2016-04-14 LAB — CBC
HCT: 39.2 % (ref 36.0–46.0)
Hemoglobin: 13 g/dL (ref 12.0–15.0)
MCH: 30.3 pg (ref 26.0–34.0)
MCHC: 33.2 g/dL (ref 30.0–36.0)
MCV: 91.4 fL (ref 78.0–100.0)
PLATELETS: 220 10*3/uL (ref 150–400)
RBC: 4.29 MIL/uL (ref 3.87–5.11)
RDW: 13.6 % (ref 11.5–15.5)
WBC: 5.8 10*3/uL (ref 4.0–10.5)

## 2016-04-14 LAB — SURGICAL PCR SCREEN
MRSA, PCR: NEGATIVE
STAPHYLOCOCCUS AUREUS: NEGATIVE

## 2016-04-14 NOTE — Pre-Procedure Instructions (Signed)
04-14-16 Dr. Vivi Barrack in for preop anesthesia visit per pt request-questions answered.

## 2016-04-21 ENCOUNTER — Encounter (HOSPITAL_COMMUNITY)
Admission: RE | Disposition: A | Discharge status: Home Health | Payer: Self-pay | Source: Ambulatory Visit | Attending: Orthopaedic Surgery

## 2016-04-21 ENCOUNTER — Inpatient Hospital Stay (HOSPITAL_COMMUNITY): Payer: PPO

## 2016-04-21 ENCOUNTER — Encounter (HOSPITAL_COMMUNITY): Payer: Self-pay | Admitting: Anesthesiology

## 2016-04-21 ENCOUNTER — Inpatient Hospital Stay (HOSPITAL_COMMUNITY): Payer: PPO | Admitting: Registered Nurse

## 2016-04-21 ENCOUNTER — Inpatient Hospital Stay (HOSPITAL_COMMUNITY)
Admission: RE | Admit: 2016-04-21 | Discharge: 2016-04-24 | DRG: 470 | Disposition: A | Discharge status: Home Health | Payer: PPO | Source: Ambulatory Visit | Attending: Orthopaedic Surgery | Admitting: Orthopaedic Surgery

## 2016-04-21 DIAGNOSIS — M8580 Other specified disorders of bone density and structure, unspecified site: Secondary | ICD-10-CM | POA: Diagnosis not present

## 2016-04-21 DIAGNOSIS — Z87891 Personal history of nicotine dependence: Secondary | ICD-10-CM

## 2016-04-21 DIAGNOSIS — G8929 Other chronic pain: Secondary | ICD-10-CM | POA: Diagnosis present

## 2016-04-21 DIAGNOSIS — M545 Low back pain: Secondary | ICD-10-CM | POA: Diagnosis present

## 2016-04-21 DIAGNOSIS — Z96641 Presence of right artificial hip joint: Secondary | ICD-10-CM

## 2016-04-21 DIAGNOSIS — M25551 Pain in right hip: Secondary | ICD-10-CM | POA: Diagnosis not present

## 2016-04-21 DIAGNOSIS — D62 Acute posthemorrhagic anemia: Secondary | ICD-10-CM | POA: Diagnosis not present

## 2016-04-21 DIAGNOSIS — Z96642 Presence of left artificial hip joint: Secondary | ICD-10-CM | POA: Diagnosis present

## 2016-04-21 DIAGNOSIS — M169 Osteoarthritis of hip, unspecified: Secondary | ICD-10-CM | POA: Diagnosis not present

## 2016-04-21 DIAGNOSIS — M1611 Unilateral primary osteoarthritis, right hip: Secondary | ICD-10-CM

## 2016-04-21 DIAGNOSIS — R52 Pain, unspecified: Secondary | ICD-10-CM

## 2016-04-21 DIAGNOSIS — Z87442 Personal history of urinary calculi: Secondary | ICD-10-CM

## 2016-04-21 DIAGNOSIS — Z471 Aftercare following joint replacement surgery: Secondary | ICD-10-CM | POA: Diagnosis not present

## 2016-04-21 HISTORY — DX: Unilateral primary osteoarthritis, right hip: M16.11

## 2016-04-21 HISTORY — PX: TOTAL HIP ARTHROPLASTY: SHX124

## 2016-04-21 LAB — TYPE AND SCREEN
ABO/RH(D): A POS
Antibody Screen: NEGATIVE

## 2016-04-21 SURGERY — ARTHROPLASTY, HIP, TOTAL, ANTERIOR APPROACH
Anesthesia: Spinal | Site: Hip | Laterality: Right

## 2016-04-21 MED ORDER — CEFAZOLIN SODIUM-DEXTROSE 2-4 GM/100ML-% IV SOLN
2.0000 g | INTRAVENOUS | Status: AC
  Administered 2016-04-21: 2 g via INTRAVENOUS

## 2016-04-21 MED ORDER — BUPIVACAINE IN DEXTROSE 0.75-8.25 % IT SOLN
INTRATHECAL | Status: DC | PRN
  Administered 2016-04-21: 1.8 mL via INTRATHECAL

## 2016-04-21 MED ORDER — PROPOFOL 10 MG/ML IV BOLUS
INTRAVENOUS | Status: AC
  Filled 2016-04-21: qty 20

## 2016-04-21 MED ORDER — ACETAMINOPHEN 650 MG RE SUPP: 650.0000 mg | Freq: Four times a day (QID) | RECTAL | Status: DC | PRN

## 2016-04-21 MED ORDER — ONDANSETRON HCL 4 MG/2ML IJ SOLN
INTRAMUSCULAR | Status: AC
  Filled 2016-04-21: qty 2

## 2016-04-21 MED ORDER — MENTHOL 3 MG MT LOZG: 1.0000 | LOZENGE | OROMUCOSAL | Status: DC | PRN

## 2016-04-21 MED ORDER — DOCUSATE SODIUM 100 MG PO CAPS
100.0000 mg | ORAL_CAPSULE | Freq: Two times a day (BID) | ORAL | Status: DC
  Administered 2016-04-21 – 2016-04-24 (×6): 100 mg via ORAL
  Filled 2016-04-21 (×6): qty 1

## 2016-04-21 MED ORDER — TRAMADOL HCL 50 MG PO TABS: 100.0000 mg | ORAL_TABLET | Freq: Four times a day (QID) | ORAL | Status: DC | PRN

## 2016-04-21 MED ORDER — DEXAMETHASONE SODIUM PHOSPHATE 10 MG/ML IJ SOLN
INTRAMUSCULAR | Status: AC
  Filled 2016-04-21: qty 1

## 2016-04-21 MED ORDER — DEXAMETHASONE SODIUM PHOSPHATE 10 MG/ML IJ SOLN
INTRAMUSCULAR | Status: DC | PRN
  Administered 2016-04-21: 10 mg via INTRAVENOUS

## 2016-04-21 MED ORDER — FENTANYL CITRATE (PF) 100 MCG/2ML IJ SOLN
INTRAMUSCULAR | Status: AC
  Filled 2016-04-21: qty 2

## 2016-04-21 MED ORDER — MIDAZOLAM HCL 2 MG/2ML IJ SOLN
INTRAMUSCULAR | Status: AC
  Filled 2016-04-21: qty 2

## 2016-04-21 MED ORDER — HYDROMORPHONE HCL 1 MG/ML IJ SOLN
INTRAMUSCULAR | Status: AC
  Filled 2016-04-21: qty 1

## 2016-04-21 MED ORDER — CHLORHEXIDINE GLUCONATE 4 % EX LIQD: 60.0000 mL | Freq: Once | CUTANEOUS | Status: DC

## 2016-04-21 MED ORDER — DIPHENHYDRAMINE HCL 12.5 MG/5ML PO ELIX: 12.5000 mg | ORAL_SOLUTION | ORAL | Status: DC | PRN

## 2016-04-21 MED ORDER — HYDROMORPHONE HCL 1 MG/ML IJ SOLN: 0.2500 mg | INTRAMUSCULAR | Status: DC | PRN

## 2016-04-21 MED ORDER — TRANEXAMIC ACID 1000 MG/10ML IV SOLN
1000.0000 mg | INTRAVENOUS | Status: AC
  Administered 2016-04-21: 1000 mg via INTRAVENOUS
  Filled 2016-04-21: qty 10

## 2016-04-21 MED ORDER — ONDANSETRON HCL 4 MG/2ML IJ SOLN
INTRAMUSCULAR | Status: DC | PRN
  Administered 2016-04-21: 4 mg via INTRAVENOUS

## 2016-04-21 MED ORDER — SODIUM CHLORIDE 0.9 % IR SOLN
Status: DC | PRN
  Administered 2016-04-21: 1000 mL

## 2016-04-21 MED ORDER — ACETAMINOPHEN 325 MG PO TABS: 650.0000 mg | ORAL_TABLET | Freq: Four times a day (QID) | ORAL | Status: DC | PRN

## 2016-04-21 MED ORDER — MEPERIDINE HCL 50 MG/ML IJ SOLN: 6.2500 mg | INTRAMUSCULAR | Status: DC | PRN

## 2016-04-21 MED ORDER — FENTANYL CITRATE (PF) 100 MCG/2ML IJ SOLN: 25.0000 ug | INTRAMUSCULAR | Status: DC | PRN

## 2016-04-21 MED ORDER — SODIUM CHLORIDE 0.9 % IV SOLN
INTRAVENOUS | Status: DC
  Administered 2016-04-21 – 2016-04-23 (×3): via INTRAVENOUS

## 2016-04-21 MED ORDER — HYDROMORPHONE HCL 1 MG/ML IJ SOLN
0.5000 mg | INTRAMUSCULAR | Status: DC | PRN
  Administered 2016-04-21 (×2): 0.5 mg via INTRAVENOUS
  Filled 2016-04-21 (×2): qty 1

## 2016-04-21 MED ORDER — LIDOCAINE HCL (CARDIAC) 20 MG/ML IV SOLN
INTRAVENOUS | Status: DC | PRN
  Administered 2016-04-21: 100 mg via INTRAVENOUS

## 2016-04-21 MED ORDER — CELECOXIB 200 MG PO CAPS
200.0000 mg | ORAL_CAPSULE | Freq: Two times a day (BID) | ORAL | Status: DC
  Administered 2016-04-21 – 2016-04-24 (×6): 200 mg via ORAL
  Filled 2016-04-21 (×6): qty 1

## 2016-04-21 MED ORDER — CEFAZOLIN SODIUM-DEXTROSE 2-4 GM/100ML-% IV SOLN
INTRAVENOUS | Status: AC
  Filled 2016-04-21: qty 100

## 2016-04-21 MED ORDER — METOCLOPRAMIDE HCL 5 MG/ML IJ SOLN: 10.0000 mg | Freq: Once | INTRAMUSCULAR | Status: DC | PRN

## 2016-04-21 MED ORDER — METOCLOPRAMIDE HCL 5 MG/ML IJ SOLN: 5.0000 mg | Freq: Three times a day (TID) | INTRAMUSCULAR | Status: DC | PRN

## 2016-04-21 MED ORDER — PHENOL 1.4 % MT LIQD: 1.0000 | OROMUCOSAL | Status: DC | PRN

## 2016-04-21 MED ORDER — METHOCARBAMOL 1000 MG/10ML IJ SOLN
500.0000 mg | Freq: Four times a day (QID) | INTRAMUSCULAR | Status: DC | PRN
  Administered 2016-04-21: 500 mg via INTRAVENOUS
  Filled 2016-04-21: qty 5
  Filled 2016-04-21: qty 550

## 2016-04-21 MED ORDER — ADULT MULTIVITAMIN W/MINERALS CH
1.0000 | ORAL_TABLET | Freq: Every day | ORAL | Status: DC
  Administered 2016-04-21 – 2016-04-24 (×4): 1 via ORAL
  Filled 2016-04-21 (×4): qty 1

## 2016-04-21 MED ORDER — LIDOCAINE HCL (CARDIAC) 20 MG/ML IV SOLN
INTRAVENOUS | Status: AC
  Filled 2016-04-21: qty 5

## 2016-04-21 MED ORDER — CEFAZOLIN IN D5W 1 GM/50ML IV SOLN
1.0000 g | Freq: Four times a day (QID) | INTRAVENOUS | Status: AC
  Administered 2016-04-21 (×2): 1 g via INTRAVENOUS
  Filled 2016-04-21 (×2): qty 50

## 2016-04-21 MED ORDER — METOCLOPRAMIDE HCL 5 MG PO TABS: 5.0000 mg | ORAL_TABLET | Freq: Three times a day (TID) | ORAL | Status: DC | PRN

## 2016-04-21 MED ORDER — PROPOFOL 500 MG/50ML IV EMUL
INTRAVENOUS | Status: DC | PRN
Start: 2016-04-21 — End: 2016-04-21
  Administered 2016-04-21: 25 ug/kg/min via INTRAVENOUS

## 2016-04-21 MED ORDER — ONDANSETRON HCL 4 MG/2ML IJ SOLN: 4.0000 mg | Freq: Once | INTRAMUSCULAR | Status: DC | PRN

## 2016-04-21 MED ORDER — OXYCODONE HCL 5 MG PO TABS
5.0000 mg | ORAL_TABLET | ORAL | Status: DC | PRN
  Administered 2016-04-21: 10 mg via ORAL
  Administered 2016-04-21 (×3): 5 mg via ORAL
  Administered 2016-04-22 (×4): 10 mg via ORAL
  Filled 2016-04-21: qty 1
  Filled 2016-04-21: qty 2
  Filled 2016-04-21: qty 1
  Filled 2016-04-21 (×4): qty 2
  Filled 2016-04-21: qty 1

## 2016-04-21 MED ORDER — HYDROMORPHONE HCL 1 MG/ML IJ SOLN
0.2500 mg | INTRAMUSCULAR | Status: DC | PRN
  Administered 2016-04-21 (×2): 0.5 mg via INTRAVENOUS

## 2016-04-21 MED ORDER — ONDANSETRON HCL 4 MG PO TABS: 4.0000 mg | ORAL_TABLET | Freq: Four times a day (QID) | ORAL | Status: DC | PRN

## 2016-04-21 MED ORDER — CITALOPRAM HYDROBROMIDE 20 MG PO TABS
40.0000 mg | ORAL_TABLET | Freq: Every day | ORAL | Status: DC
  Administered 2016-04-21 – 2016-04-23 (×3): 40 mg via ORAL
  Filled 2016-04-21 (×3): qty 2

## 2016-04-21 MED ORDER — ASPIRIN EC 325 MG PO TBEC
325.0000 mg | DELAYED_RELEASE_TABLET | Freq: Every day | ORAL | Status: DC
  Administered 2016-04-22 – 2016-04-24 (×3): 325 mg via ORAL
  Filled 2016-04-21 (×3): qty 1

## 2016-04-21 MED ORDER — METHOCARBAMOL 500 MG PO TABS
500.0000 mg | ORAL_TABLET | Freq: Four times a day (QID) | ORAL | Status: DC | PRN
  Administered 2016-04-21 – 2016-04-24 (×3): 500 mg via ORAL
  Filled 2016-04-21 (×3): qty 1

## 2016-04-21 MED ORDER — ONDANSETRON HCL 4 MG/2ML IJ SOLN
4.0000 mg | Freq: Four times a day (QID) | INTRAMUSCULAR | Status: DC | PRN
Start: 2016-04-21 — End: 2016-04-24

## 2016-04-21 MED ORDER — ALUM & MAG HYDROXIDE-SIMETH 200-200-20 MG/5ML PO SUSP: 30.0000 mL | ORAL | Status: DC | PRN

## 2016-04-21 MED ORDER — STERILE WATER FOR IRRIGATION IR SOLN
Status: DC | PRN
  Administered 2016-04-21: 2000 mL

## 2016-04-21 MED ORDER — LACTATED RINGERS IV SOLN
INTRAVENOUS | Status: DC
  Administered 2016-04-21: 09:00:00 via INTRAVENOUS

## 2016-04-21 MED ORDER — ZOLPIDEM TARTRATE 5 MG PO TABS: 5.0000 mg | ORAL_TABLET | Freq: Every evening | ORAL | Status: DC | PRN

## 2016-04-21 MED ORDER — ALPRAZOLAM 0.25 MG PO TABS: 0.2500 mg | ORAL_TABLET | Freq: Two times a day (BID) | ORAL | Status: DC | PRN

## 2016-04-21 MED ORDER — FENTANYL CITRATE (PF) 100 MCG/2ML IJ SOLN
INTRAMUSCULAR | Status: DC | PRN
  Administered 2016-04-21: 50 ug via INTRAVENOUS

## 2016-04-21 SURGICAL SUPPLY — 37 items
BAG ZIPLOCK 12X15 (MISCELLANEOUS) ×2 IMPLANT
BENZOIN TINCTURE PRP APPL 2/3 (GAUZE/BANDAGES/DRESSINGS) ×2 IMPLANT
BLADE SAW SGTL 18X1.27X75 (BLADE) ×2 IMPLANT
CAPT HIP TOTAL 2 ×2 IMPLANT
CELLS DAT CNTRL 66122 CELL SVR (MISCELLANEOUS) ×1 IMPLANT
CLOTH BEACON ORANGE TIMEOUT ST (SAFETY) ×2 IMPLANT
DRAPE STERI IOBAN 125X83 (DRAPES) ×2 IMPLANT
DRAPE U-SHAPE 47X51 STRL (DRAPES) ×4 IMPLANT
DRESSING AQUACEL AG SP 3.5X10 (GAUZE/BANDAGES/DRESSINGS) ×1 IMPLANT
DRSG AQUACEL AG ADV 3.5X10 (GAUZE/BANDAGES/DRESSINGS) ×2 IMPLANT
DRSG AQUACEL AG SP 3.5X10 (GAUZE/BANDAGES/DRESSINGS) ×2
DURAPREP 26ML APPLICATOR (WOUND CARE) ×2 IMPLANT
ELECT REM PT RETURN 9FT ADLT (ELECTROSURGICAL) ×2
ELECTRODE REM PT RTRN 9FT ADLT (ELECTROSURGICAL) ×1 IMPLANT
GLOVE BIO SURGEON STRL SZ7.5 (GLOVE) ×2 IMPLANT
GLOVE BIOGEL PI IND STRL 7.5 (GLOVE) ×4 IMPLANT
GLOVE BIOGEL PI IND STRL 8 (GLOVE) ×2 IMPLANT
GLOVE BIOGEL PI INDICATOR 7.5 (GLOVE) ×4
GLOVE BIOGEL PI INDICATOR 8 (GLOVE) ×2
GLOVE ECLIPSE 8.0 STRL XLNG CF (GLOVE) ×2 IMPLANT
GLOVE SURG SS PI 7.5 STRL IVOR (GLOVE) ×4 IMPLANT
GOWN STRL REUS W/ TWL XL LVL3 (GOWN DISPOSABLE) ×1 IMPLANT
GOWN STRL REUS W/TWL XL LVL3 (GOWN DISPOSABLE) ×7 IMPLANT
HANDPIECE INTERPULSE COAX TIP (DISPOSABLE) ×1
HOLDER FOLEY CATH W/STRAP (MISCELLANEOUS) ×2 IMPLANT
PACK ANTERIOR HIP CUSTOM (KITS) ×2 IMPLANT
RTRCTR WOUND ALEXIS 18CM MED (MISCELLANEOUS) ×2
SET HNDPC FAN SPRY TIP SCT (DISPOSABLE) ×1 IMPLANT
STRIP CLOSURE SKIN 1/2X4 (GAUZE/BANDAGES/DRESSINGS) ×2 IMPLANT
SUT ETHIBOND NAB CT1 #1 30IN (SUTURE) ×2 IMPLANT
SUT MNCRL AB 4-0 PS2 18 (SUTURE) ×2 IMPLANT
SUT VIC AB 0 CT1 36 (SUTURE) ×2 IMPLANT
SUT VIC AB 1 CT1 36 (SUTURE) ×2 IMPLANT
SUT VIC AB 2-0 CT1 27 (SUTURE) ×2
SUT VIC AB 2-0 CT1 TAPERPNT 27 (SUTURE) ×2 IMPLANT
TRAY FOLEY W/METER SILVER 14FR (SET/KITS/TRAYS/PACK) ×2 IMPLANT
YANKAUER SUCT BULB TIP NO VENT (SUCTIONS) ×2 IMPLANT

## 2016-04-21 NOTE — Anesthesia Postprocedure Evaluation (Signed)
Anesthesia Post Note  Patient: [name]  Procedure(s) Performed: Procedure(s) (LRB): RIGHT TOTAL HIP ARTHROPLASTY ANTERIOR APPROACH (Right)  Patient location during evaluation: PACU Anesthesia Type: Spinal Level of consciousness: awake and alert and oriented Pain management: pain level controlled Vital Signs Assessment: post-procedure vital signs reviewed and stable Respiratory status: spontaneous breathing, nonlabored ventilation and respiratory function stable Cardiovascular status: blood pressure returned to baseline and stable Postop Assessment: no signs of nausea or vomiting, spinal receding, patient able to bend at knees, no backache and no headache Anesthetic complications: no    Last Vitals:  Filed Vitals:   04/21/16 1200 04/21/16 1215  BP: 139/64 137/58  Pulse: 58 54  Temp: 36.7 C   Resp: 14 10    Last Pain: There were no vitals filed for this visit.               [name]

## 2016-04-21 NOTE — Brief Op Note (Signed)
04/21/2016  11:07 AM  PATIENT:  Letta Median  75 y.o. female  PRE-OPERATIVE DIAGNOSIS:  Osteoarthritis right hip  POST-OPERATIVE DIAGNOSIS:  Osteoarthritis right hip  PROCEDURE:  Procedure(s): RIGHT TOTAL HIP ARTHROPLASTY ANTERIOR APPROACH (Right)  SURGEON:  Surgeon(s) and Role:    * Mcarthur Rossetti, MD - Primary  PHYSICIAN ASSISTANT: Benita Stabile, PA-C  ANESTHESIA:   spinal  EBL:  Total I/O In: 1000 [I.V.:1000] Out: 350 [Urine:150; Blood:200]  COUNTS:  YES  DICTATION: .Other Dictation: Dictation Number [id number]  PLAN OF CARE: Admit to inpatient   PATIENT DISPOSITION:  PACU - hemodynamically stable.   Delay start of Pharmacological VTE agent (>24hrs) due to surgical blood loss or risk of bleeding: no

## 2016-04-21 NOTE — Evaluation (Signed)
Physical Therapy Evaluation Patient Details Name: [name] MRN: [id number] DOB: 06/21/1941 Today's Date: 04/21/2016   History of Present Illness  [name]; Pt s/p L THR (13)  Clinical Impression  Pt s/p [name] presents with decreased R LE strength/ROM and post op pain limiting functional mobility.  Pt should progress to dc home with 24/7 assist of friends and follow up HHPT    Follow Up Recommendations Home health PT    Equipment Recommendations  None recommended by PT    Recommendations for Other Services OT consult     Precautions / Restrictions Precautions Precautions: Fall Restrictions Weight Bearing Restrictions: No Other Position/Activity Restrictions: WBAT      Mobility  Bed Mobility Overal bed mobility: Needs Assistance Bed Mobility: Supine to Sit     Supine to sit: Min assist;Mod assist     General bed mobility comments: cues for sequence and use of L LE to self assist  Transfers Overall transfer level: Needs assistance Equipment used: Rolling walker (2 wheeled) Transfers: Sit to/from Stand Sit to Stand: Min assist;Mod assist         General transfer comment: cues for LE management and use of UEs to self assist  Ambulation/Gait Ambulation/Gait assistance: Min assist Ambulation Distance (Feet): 48 Feet Assistive device: Rolling walker (2 wheeled) Gait Pattern/deviations: Step-to pattern;Decreased step length - right;Decreased step length - left;Shuffle;Trunk flexed Gait velocity: decr Gait velocity interpretation: Below normal speed for age/gender General Gait Details: cues for posture, position from RW and sequence  Stairs            Wheelchair Mobility    Modified Rankin (Stroke Patients Only)       Balance                                             Pertinent Vitals/Pain Pain Assessment: 0-10 Pain Score: 4  Pain Location: R hip Pain Descriptors / Indicators: Aching;Sore Pain Intervention(s): Limited activity  within patient's tolerance;Monitored during session;Premedicated before session;Ice applied    Home Living Family/patient expects to be discharged to:: Private residence Living Arrangements: Alone Available Help at Discharge: Friend(s) Type of Home: House Home Access: Stairs to enter Entrance Stairs-Rails: Right;None Technical brewer of Steps: 2+1 Home Layout: One level Home Equipment: Environmental consultant - 2 wheels;Cane - single point Additional Comments: Pt has 24/7 assist of friends    Prior Function Level of Independence: Independent               Hand Dominance        Extremity/Trunk Assessment   Upper Extremity Assessment: Overall WFL for tasks assessed           Lower Extremity Assessment: RLE deficits/detail      Cervical / Trunk Assessment: Normal  Communication   Communication: No difficulties  Cognition Arousal/Alertness: Awake/alert Behavior During Therapy: WFL for tasks assessed/performed Overall Cognitive Status: Within Functional Limits for tasks assessed                      General Comments      Exercises Total Joint Exercises Ankle Circles/Pumps: AROM;Both;15 reps;Supine      Assessment/Plan    PT Assessment Patient needs continued PT services  PT Diagnosis Difficulty walking   PT Problem List Decreased strength;Decreased range of motion;Decreased activity tolerance;Decreased balance;Decreased mobility;Decreased knowledge of use of DME;Pain  PT Treatment Interventions DME instruction;Gait  training;Stair training;Functional mobility training;Therapeutic activities;Therapeutic exercise;Patient/family education   PT Goals (Current goals can be found in the Care Plan section) Acute Rehab PT Goals Patient Stated Goal: Walk without pain PT Goal Formulation: With patient Time For Goal Achievement: 04/25/16 Potential to Achieve Goals: Good    Frequency 7X/week   Barriers to discharge        Co-evaluation               End  of Session Equipment Utilized During Treatment: Gait belt Activity Tolerance: Patient tolerated treatment well Patient left: in chair;with call bell/phone within reach;with chair alarm set;with family/visitor present Nurse Communication: Mobility status         Time: 1535-1605 PT Time Calculation (min) (ACUTE ONLY): 30 min   Charges:   PT Evaluation $PT Eval Low Complexity: 1 Procedure PT Treatments $Gait Training: 8-22 mins   PT G Codes:        [name] 04/29/2016, 4:39 PM

## 2016-04-21 NOTE — H&P (Signed)
TOTAL HIP ADMISSION H&P  Patient is admitted for right total hip arthroplasty.  Subjective:  Chief Complaint: right hip pain  HPI: [name], 75 y.o. female, has a history of pain and functional disability in the right hip(s) due to arthritis and patient has failed non-surgical conservative treatments for greater than 12 weeks to include NSAID's and/or analgesics, corticosteriod injections, use of assistive devices, weight reduction as appropriate and activity modification.  Onset of symptoms was gradual starting 3 years ago with gradually worsening course since that time.The patient noted no past surgery on the right hip(s).  Patient currently rates pain in the right hip at 9 out of 10 with activity. Patient has night pain, worsening of pain with activity and weight bearing, pain that interfers with activities of daily living and pain with passive range of motion. Patient has evidence of subchondral cysts, subchondral sclerosis, periarticular osteophytes and joint space narrowing by imaging studies. This condition presents safety issues increasing the risk of falls.  There is no current active infection.  Patient Active Problem List   Diagnosis Date Noted  . 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 Degenerative arthritis of hip 04/19/2012  . Anxiety   . Cancer ([place])   . Depression   . Arthritis   . RENAL CALCULUS, RIGHT 12/15/2008  . ABDOMINAL PAIN, UNSPECIFIED SITE 12/15/2008  . NEPHROLITHIASIS, HX OF 12/15/2008  . ANXIETY DEPRESSION 10/29/2007  . CONTACT DERMATITIS DUE TO SOLAR RADIATION 10/29/2007  . OSTEOARTHROSIS, LOCAL, PRIMARY, UNSPC SITE 08/16/2007   Past Medical History  Diagnosis Date  . Anxiety   . Depression   . 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 Osteopenia   . Complication of anesthesia     "felt it made her feel very goofy for several months"  . History of kidney stones     x1 episode  . Back pain     generally "causes severe stiffness"  . Arthritis     lower back, hip    Past Surgical History  Procedure Laterality Date  . Breast lumpectomy  1961  . Total hip arthroplasty  04/19/2012    Procedure: TOTAL HIP ARTHROPLASTY ANTERIOR APPROACH;  Surgeon: Mcarthur Rossetti, MD;  Location: [facility];  Service: Orthopedics;  Laterality: Left;  Left Total Hip Arthroplasty  . Appendectomy  2010    open    No prescriptions prior to admission   No Known Allergies  Social History  Substance Use Topics  . Smoking status: Former Smoker    Quit date: 04/15/1986  . Smokeless tobacco: Never Used  . Alcohol Use: 3.5 oz/week    7 Standard drinks or equivalent per week     Comment: 1 drink daily    Family History  Problem Relation Age of Onset  . Cancer Father     prostate  . Hypertension Father   . Heart attack Father   . Cancer Brother     Occupational psychologist  . Cancer Maternal Grandmother     throat  . Breast cancer Paternal Grandmother      Review of Systems  Musculoskeletal: Positive for joint pain.  All other systems reviewed and are negative.   Objective:  Physical Exam  Constitutional: She is oriented to person, place, and time. She appears well-developed and well-nourished.  HENT:  Head: Normocephalic and atraumatic.  Eyes: EOM are normal. Pupils are equal, round, and reactive to light.  Neck: Normal range of motion. Neck supple.  Cardiovascular: Normal rate and regular rhythm.  Respiratory: Effort normal and breath sounds normal.  GI: Soft. Bowel sounds are normal.  Musculoskeletal:       Right hip: She exhibits decreased range of motion, decreased strength, tenderness and bony tenderness.  Neurological: She is alert and oriented to person, place, and time.  Skin: Skin is warm and dry.  Psychiatric: She has a normal mood and affect.    Vital signs in last 24 hours:    Labs:   Estimated body mass index is 24.87 kg/(m^2) as calculated from the following:   Height as of 09/29/15: 5\' 6"  (1.676 m).   Weight as of  09/29/15: 69.854 kg (154 lb).   Imaging Review Plain radiographs demonstrate severe degenerative joint disease of the right hip(s). The bone quality appears to be good for age and reported activity level.  Assessment/Plan:  End stage arthritis, right hip(s)  The patient history, physical examination, clinical judgement of the provider and imaging studies are consistent with end stage degenerative joint disease of the right hip(s) and total hip arthroplasty is deemed medically necessary. The treatment options including medical management, injection therapy, arthroscopy and arthroplasty were discussed at length. The risks and benefits of total hip arthroplasty were presented and reviewed. The risks due to aseptic loosening, infection, stiffness, dislocation/subluxation,  thromboembolic complications and other imponderables were discussed.  The patient acknowledged the explanation, agreed to proceed with the plan and consent was signed. Patient is being admitted for inpatient treatment for surgery, pain control, PT, OT, prophylactic antibiotics, VTE prophylaxis, progressive ambulation and ADL's and discharge planning.The patient is planning to be discharged home with home health services

## 2016-04-21 NOTE — Anesthesia Procedure Notes (Addendum)
Spinal Patient location during procedure: OR Start time: 04/21/2016 9:57 AM End time: 04/21/2016 10:03 AM Staffing Anesthesiologist: Josephine Igo Resident/CRNA: Carleene Cooper A Performed by: resident/CRNA and anesthesiologist  Preanesthetic Checklist Completed: patient identified, site marked, surgical consent, pre-op evaluation, timeout performed, IV checked, risks and benefits discussed and monitors and equipment checked Spinal Block Patient position: sitting Prep: ChloraPrep Patient monitoring: heart rate Approach: midline Location: L2-3 Needle Needle type: Pencan  Needle gauge: 24 G Needle length: 10 cm Needle insertion depth: 5 cm Assessment Sensory level: T4 Additional Notes Spinal kit expiration date checked and verified. +CSF, -heme on 2nd attempt. Pt tolerated well. Adequate sensory level

## 2016-04-21 NOTE — Op Note (Signed)
[name], [name]                 ACCOUNT NO.:  [id number]  MEDICAL RECORD NO.:  [id number]  LOCATION:  [facility]                         FACILITY:  [facility]  PHYSICIAN:  Lind Guest. Ninfa Linden, M.D.DATE OF BIRTH:  1941-06-15  DATE OF PROCEDURE:  04/21/2016 DATE OF DISCHARGE:                              OPERATIVE REPORT   PREOPERATIVE DIAGNOSIS:  Primary osteoarthritis and degenerative joint disease of right hip.  POSTOPERATIVE DIAGNOSIS:  Primary osteoarthritis and degenerative joint disease of right hip.  PROCEDURE:  Right total hip arthroplasty through direct anterior approach.  IMPLANTS:  DePuy Sector Gription acetabular component size 54 with a single screw, size 36+ 0 neutral polyethylene liner, size 12 Corail femoral component with standard offset, size 36+ 1.5 ceramic hip ball.  SURGEON:  Lind Guest. Ninfa Linden, M.D.  ASSISTANT:  Erskine Emery, PA-C.  ANESTHESIA:  Spinal.  BLOOD LOSS:  200 mL.  ANTIBIOTICS:  2 g of IV Ancef.  COMPLICATIONS:  None.  INDICATIONS:  [name] is a 75 year old female, well known to me.  She has known primary osteoarthritis and degenerative joint disease of her right hip.  In 2013, she underwent a successful left total hip arthroplasty due to the same disease.  Her pain now is daily, it is detrimentally affected her activities of daily living, her quality of life and her mobility.  At that point, she does wish to proceed with a total hip arthroplasty through direct anterior approach.  She understands the risk of acute blood loss anemia, nerve and vessel injury, fracture, infection, dislocation, DVT.  She understands our goals are decreased pain, improved mobility and overall improved quality of life.  PROCEDURE DESCRIPTION:  After informed consent was obtained, appropriate right hip was marked.  She was brought to the operating room where spinal anesthesia was obtained while she was on her stretcher.  She was then laid supine on the  stretcher.  A Foley catheter was placed and both feet had traction boots applied to them.  Next, she was placed supine on the Hana fracture table with the perineal post in place and both legs in inline skeletal traction devices, but no traction applied.  Her right operative hip was then prepped and draped with DuraPrep and sterile drapes.  A time-out was called, she was identified as correct patient and correct right hip.  I then made an incision just inferior and posterior to the anterior superior iliac spine and carried this obliquely down the leg.  We dissected down the tensor fascia lata muscle and the tensor fascia was then divided longitudinally, so we could proceed with direct anterior approach to the hip.  We identified and cauterized the circumflex vessels and then identified the hip capsule. We placed our cobra retractors around the lateral, medial, femoral neck. We then opened up the hip capsule in an L-type format finding a large joint effusion and we placed Cobra retractors within the hip capsule. We then made our femoral neck cut with an oscillating saw proximal to the lesser trochanter and completed this with an osteotome.  I placed a corkscrew guide in the femoral head and removed the femoral head in its entirety and found it to be completely  devoid of cartilage.  I then placed a bent Hohmann over the medial acetabular rim and removed remnants of the acetabular labrum and other debris.  We then began reaming under direct visualization from a size 42 reamer in stepwise increments up to a size 54, with the last 3 reamers, all placed under direct fluoroscopy.  She had significantly sclerotic bone.  With a size 54, we were pleased with the fit of this, we reamed with what we felt was appropriate medialization, inclination and anteversion.  We then placed the real DePuy Sector Gription acetabular component size 54 and due to some sclerotic bone, I placed a single 25-mm screw.   We then placed the real polyethylene liner, 36+ 0 neutral for that size acetabular component.  Attention was then turned to the femur.  With the leg externally rotated to 120 degrees, extended and adducted, I was able to place a Mueller retractor medially and a Hohmann retractor behind the greater trochanter.  We released the lateral joint capsule and used a box cutting osteotome to enter the femoral canal and a rongeur to lateralize.  We then began broaching from a size 8 broach using the Corail broaching system up to a size 12.  With the size 12, we placed a standard offset femoral neck and a 36+ 1.5 hip ball.  We brought the leg back over and up with traction and internal rotation reducing the pelvis.  We were pleased with leg length, offset, and range of motion and assessed this under fluoroscopy as well.  We then dislocated the hip and removed the trial components.  We were able to place the real Corail femoral component from DePuy size 12 with standard offset, the real 36+ 1.5 ceramic hip ball.  We reduced this in the acetabulum and it was stable.  We then irrigated the soft tissue with normal saline solution using pulsatile lavage.  We closed the joint capsule with interrupted #1 Ethibond suture followed by running #1 Vicryl in the tensor fascia, 0 Vicryl in the deep tissue, 2-0 Vicryl in the subcutaneous tissue and interrupted 4-0 Monocryl subcuticular stitch.  Steri-Strips were applied as well as an Aquacel dressing.  She was then taken off the Hana table and taken to the recovery room in stable condition.  All final counts were correct.  There were no complications noted.  Of note, Erskine Emery, PA-C assisted in the entire case, his assistance was crucial for facilitating all aspects of this case.     Lind Guest. Ninfa Linden, M.D.     CYB/MEDQ  D:  04/21/2016  T:  04/21/2016  Job:  [id number]

## 2016-04-21 NOTE — Transfer of Care (Signed)
Immediate Anesthesia Transfer of Care Note  Patient: [name]  Procedure(s) Performed: Procedure(s): RIGHT TOTAL HIP ARTHROPLASTY ANTERIOR APPROACH (Right)  Patient Location: PACU  Anesthesia Type:MAC and Spinal  Level of Consciousness: awake, alert , oriented and patient cooperative  Airway & Oxygen Therapy: Patient Spontanous Breathing and Patient connected to face mask oxygen  Post-op Assessment: Report given to RN and Post -op Vital signs reviewed and stable  Post vital signs: Reviewed and stable  Last Vitals:  Filed Vitals:   04/21/16 0739  BP: 144/59  Pulse: 64  Temp: 36.8 C  Resp: 16    Last Pain: There were no vitals filed for this visit.    Patients Stated Pain Goal: 3 ([phone] [id number])  Complications: No apparent anesthesia complications

## 2016-04-21 NOTE — Anesthesia Preprocedure Evaluation (Addendum)
Anesthesia Evaluation  Patient identified by MRN, date of birth, ID band Patient awake    Reviewed: Allergy & Precautions, NPO status , Patient's Chart, lab work & pertinent test results  Airway Mallampati: II  TM Distance: >3 FB Neck ROM: Full    Dental no notable dental hx. (+) Teeth Intact, Caps   Pulmonary former smoker,    Pulmonary exam normal breath sounds clear to auscultation       Cardiovascular negative cardio ROS Normal cardiovascular exam Rhythm:Regular Rate:Normal     Neuro/Psych PSYCHIATRIC DISORDERS Anxiety Depression negative neurological ROS     GI/Hepatic negative GI ROS, Neg liver ROS,   Endo/Other  negative endocrine ROS  Renal/GU Renal diseaseHx/o renal calculi  negative genitourinary   Musculoskeletal  (+) Arthritis , Osteoarthritis,  OA right hip Chronic LBP Hx/o Osteopenia   Abdominal (+) - obese,   Peds  Hematology negative hematology ROS (+)   Anesthesia Other Findings   Reproductive/Obstetrics                         Anesthesia Physical Anesthesia Plan  ASA: II  Anesthesia Plan: Spinal   Post-op Pain Management:    Induction:   Airway Management Planned: Natural Airway, Nasal Cannula and Simple Face Mask  Additional Equipment:   Intra-op Plan:   Post-operative Plan:   Informed Consent: I have reviewed the patients History and Physical, chart, labs and discussed the procedure including the risks, benefits and alternatives for the proposed anesthesia with the patient or authorized representative who has indicated his/her understanding and acceptance.   Dental advisory given  Plan Discussed with: CRNA, Anesthesiologist and Surgeon  Anesthesia Plan Comments:        Anesthesia Quick Evaluation

## 2016-04-22 LAB — CBC
HEMATOCRIT: 28.8 % — AB (ref 36.0–46.0)
HEMOGLOBIN: 9.6 g/dL — AB (ref 12.0–15.0)
MCH: 30.4 pg (ref 26.0–34.0)
MCHC: 33.3 g/dL (ref 30.0–36.0)
MCV: 91.1 fL (ref 78.0–100.0)
Platelets: 168 10*3/uL (ref 150–400)
RBC: 3.16 MIL/uL — ABNORMAL LOW (ref 3.87–5.11)
RDW: 13.2 % (ref 11.5–15.5)
WBC: 9.3 10*3/uL (ref 4.0–10.5)

## 2016-04-22 LAB — BASIC METABOLIC PANEL
Anion gap: 2 — ABNORMAL LOW (ref 5–15)
BUN: 13 mg/dL (ref 6–20)
CALCIUM: 8.3 mg/dL — AB (ref 8.9–10.3)
CHLORIDE: 106 mmol/L (ref 101–111)
CO2: 29 mmol/L (ref 22–32)
CREATININE: 0.58 mg/dL (ref 0.44–1.00)
GFR calc Af Amer: >60 mL/min (ref >60)
GFR calc non Af Amer: >60 mL/min (ref >60)
GLUCOSE: 126 mg/dL — AB (ref 65–99)
Potassium: 4.1 mmol/L (ref 3.5–5.1)
Sodium: 137 mmol/L (ref 135–145)

## 2016-04-22 NOTE — Progress Notes (Signed)
Physical Therapy Treatment Patient Details Name: [name] MRN: [id number] DOB: 02-18-1941 Today's Date: 04/22/2016    History of Present Illness [name]; Pt s/p L THR (13)    PT Comments    Pt progressing with mobility but c/o mild dizziness with ambulation - BP 84/44 - RN aware.  Follow Up Recommendations  Home health PT     Equipment Recommendations  None recommended by PT    Recommendations for Other Services OT consult     Precautions / Restrictions Precautions Precautions: Fall Restrictions Weight Bearing Restrictions: No Other Position/Activity Restrictions: WBAT    Mobility  Bed Mobility Overal bed mobility: Needs Assistance Bed Mobility: Supine to Sit     Supine to sit: Min assist     General bed mobility comments: cues for sequence and use of L LE to self assist  Transfers Overall transfer level: Needs assistance Equipment used: Rolling walker (2 wheeled) Transfers: Sit to/from Stand Sit to Stand: Min assist         General transfer comment: cues for LE management and use of UEs to self assist  Ambulation/Gait Ambulation/Gait assistance: Min assist Ambulation Distance (Feet): 75 Feet Assistive device: Rolling walker (2 wheeled) Gait Pattern/deviations: Step-to pattern;Step-through pattern;Decreased step length - right;Decreased step length - left;Shuffle;Trunk flexed Gait velocity: decr Gait velocity interpretation: Below normal speed for age/gender General Gait Details: cues for posture, position from RW and sequence   Stairs            Wheelchair Mobility    Modified Rankin (Stroke Patients Only)       Balance                                    Cognition Arousal/Alertness: Awake/alert Behavior During Therapy: WFL for tasks assessed/performed Overall Cognitive Status: Within Functional Limits for tasks assessed                      Exercises Total Joint Exercises Ankle Circles/Pumps: AROM;Both;15  reps;Supine Quad Sets: AROM;Both;10 reps;Supine Heel Slides: AAROM;Left;20 reps;Supine Hip ABduction/ADduction: AAROM;Left;15 reps;Supine    General Comments        Pertinent Vitals/Pain Pain Assessment: 0-10 Pain Score: 4  Pain Location: R hip Pain Descriptors / Indicators: Aching;Sore Pain Intervention(s): Limited activity within patient's tolerance;Monitored during session;Premedicated before session;Ice applied    Home Living                      Prior Function            PT Goals (current goals can now be found in the care plan section) Acute Rehab PT Goals Patient Stated Goal: Walk without pain PT Goal Formulation: With patient Time For Goal Achievement: 04/25/16 Potential to Achieve Goals: Good Progress towards PT goals: Progressing toward goals    Frequency  7X/week    PT Plan Current plan remains appropriate    Co-evaluation             End of Session Equipment Utilized During Treatment: Gait belt Activity Tolerance: Patient tolerated treatment well Patient left: in chair;with call bell/phone within reach;with chair alarm set;with family/visitor present     Time: 0825-0903 PT Time Calculation (min) (ACUTE ONLY): 38 min  Charges:  $Gait Training: 23-37 mins $Therapeutic Exercise: 8-22 mins                    G Codes:  Janete Quilling 04/22/2016, 12:48 PM

## 2016-04-22 NOTE — Progress Notes (Signed)
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  Follow Up Recommendations  Home health PT     Equipment Recommendations  None recommended by PT    Recommendations for Other Services OT consult     Precautions / Restrictions Precautions Precautions: Fall Restrictions Weight Bearing Restrictions: No RLE Weight Bearing: Weight bearing as tolerated    Mobility  Bed Mobility Overal bed mobility: Needs Assistance Bed Mobility: Sit to Supine       Sit to supine: Min assist   General bed mobility comments: cues for sequence and use of L LE to self assist  Transfers Overall transfer level: Needs assistance Equipment used: Rolling walker (2 wheeled) Transfers: Sit to/from Stand Sit to Stand: Min assist         General transfer comment: cues for LE management and use of UEs to self assist  Ambulation/Gait Ambulation/Gait assistance: Min assist;Min guard Ambulation Distance (Feet): 120 Feet (twice) Assistive device: Rolling walker (2 wheeled) Gait Pattern/deviations: Step-to pattern;Step-through pattern;Decreased step length - right;Decreased step length - left;Shuffle;Trunk flexed Gait velocity: decr Gait velocity interpretation: Below normal speed for age/gender General Gait Details: cues for posture, position from RW and initial sequence   Stairs            Wheelchair Mobility    Modified Rankin (Stroke Patients Only)       Balance Overall balance assessment: Needs assistance Sitting-balance support: No upper extremity supported;Feet supported Sitting balance-Leahy Scale: Fair     Standing balance support: Bilateral upper extremity supported;During functional activity Standing balance-Leahy Scale: Fair Standing balance comment: Relies  on RW for support and safety                    Cognition Arousal/Alertness: Awake/alert Behavior During Therapy: WFL for tasks assessed/performed Overall Cognitive Status: Within Functional Limits for tasks assessed                      Exercises Total Joint Exercises Ankle Circles/Pumps: AROM;Both;15 reps;Supine Quad Sets: AROM;Both;10 reps;Supine Heel Slides: AAROM;Left;20 reps;Supine Hip ABduction/ADduction: AAROM;Left;15 reps;Supine    General Comments        Pertinent Vitals/Pain Pain Assessment: 0-10 Pain Score: 4  Faces Pain Scale: Hurts little more Pain Location: R hip Pain Descriptors / Indicators: Aching;Sore Pain Intervention(s): Limited activity within patient's tolerance;Monitored during session;Premedicated before session;Ice applied    Home Living Family/patient expects to be discharged to:: Private residence Living Arrangements: Alone Available Help at Discharge: Friend(s) Type of Home: House Home Access: Stairs to enter Entrance Stairs-Rails: Right;None Home Layout: One level Home Equipment: Environmental consultant - 2 wheels;Cane - single point;Bedside commode Additional Comments: Pt has 24/7 assist of friends    Prior Function Level of Independence: Independent          PT Goals (current goals can now be found in the care plan section) Acute Rehab PT Goals Patient Stated Goal: go home tomorrow  PT Goal Formulation: With patient Time For Goal Achievement: 04/25/16 Potential to Achieve Goals: Good Progress towards PT goals: Progressing toward goals    Frequency  7X/week    PT Plan Current plan remains appropriate    Co-evaluation             End of Session Equipment Utilized  During Treatment: Gait belt Activity Tolerance: Patient tolerated treatment well Patient left: with call bell/phone within reach;in bed     Time: [id number] PT Time Calculation (min) (ACUTE ONLY): 33 min  Charges:  $Gait Training: 8-22 mins $Therapeutic  Exercise: 8-22 mins                    G Codes:      [name] 2016/04/27, 3:51 PM

## 2016-04-22 NOTE — Progress Notes (Signed)
PT reports pt's BP low during time up. Pt reports 'tired' but otherwise asymptomatic; now in chair. Followed up with pt, BP coming back up. Lenda Baratta, [employer]

## 2016-04-22 NOTE — Progress Notes (Signed)
Subjective: 1 Day Post-Op Procedure(s) (LRB): RIGHT TOTAL HIP ARTHROPLASTY ANTERIOR APPROACH (Right) Patient reports pain as moderate.  Acute blood loss anemia, but vitals stable and asymptomatic.  Objective: Vital signs in last 24 hours: Temp:  [97.5 F (36.4 C)-98.5 F (36.9 C)] 98.5 F (36.9 C) (06/24 0525) Pulse Rate:  [54-72] 66 (06/24 0525) Resp:  [10-18] 16 (06/24 0525) BP: (95-144)/(49-75) 112/51 mmHg (06/24 0525) SpO2:  [93 %-100 %] 98 % (06/24 0525) Weight:  [65.913 kg (145 lb 5 oz)] 65.913 kg (145 lb 5 oz) (06/23 0756)  Intake/Output from previous day: 06/23 0701 - 06/24 0700 In: 3451.3 [P.O.:360; I.V.:2881.3; IV Piggyback:210] Out: 2100 [Urine:1900; Blood:200] Intake/Output this shift:     Recent Labs  04/22/16 0414  HGB 9.6*    Recent Labs  04/22/16 0414  WBC 9.3  RBC 3.16*  HCT 28.8*  PLT 168    Recent Labs  04/22/16 0414  NA 137  K 4.1  CL 106  CO2 29  BUN 13  CREATININE 0.58  GLUCOSE 126*  CALCIUM 8.3*   No results for input(s): LABPT, INR in the last 72 hours.  Sensation intact distally Intact pulses distally Dorsiflexion/Plantar flexion intact Incision: dressing C/D/I  Assessment/Plan: 1 Day Post-Op Procedure(s) (LRB): RIGHT TOTAL HIP ARTHROPLASTY ANTERIOR APPROACH (Right) Up with therapy Discharge home with home health next 1-2 days.  BLACKMAN,CHRISTOPHER Y 04/22/2016, 7:11 AM

## 2016-04-22 NOTE — Evaluation (Signed)
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  Patient independent and living alone PTA. Patient currently functioning at an overall supervision to min assist level, requiring up to mod assist for LB ADLs. Patient will benefit from acute OT to increase overall independence in the areas of ADLs, functional mobility, and overall safety in order to safely discharge home with assistance from friends prn.   Pt stated she felt better than she did earlier, BP WNL; seated=133/55 and after activity seated on BSC=137/60    Follow Up Recommendations  No OT follow up;Supervision/Assistance - 24 hour    Equipment Recommendations  Other (comment) (possibly AE)    Recommendations for Other Services  None at this time   Precautions / Restrictions Precautions Precautions: Fall Restrictions Weight Bearing Restrictions: Yes RLE Weight Bearing: Weight bearing as tolerated    Mobility Bed Mobility General bed mobility comments: Pt found seated in recliner   Transfers Overall transfer level: Needs assistance Equipment used: Rolling walker (2 wheeled) Transfers: Sit to/from Stand Sit to Stand: Min assist General transfer comment: cues for LE management and use of UEs to self assist    Balance Overall balance assessment: Needs assistance Sitting-balance support: No upper extremity supported;Feet supported Sitting balance-Leahy Scale: Fair     Standing balance support: Bilateral upper extremity supported;During functional activity Standing balance-Leahy Scale: Fair Standing balance comment: Relies on RW for support and safety    ADL Overall ADL's : Needs assistance/impaired Eating/Feeding: Set up;Sitting   Grooming: Set up;Min  guard;Standing   Upper Body Bathing: Set up;Sitting   Lower Body Bathing: Minimal assistance;Sit to/from stand   Upper Body Dressing : Set up;Sitting   Lower Body Dressing: Moderate assistance;Sit to/from stand   Toilet Transfer: Minimal assistance;BSC;RW;Ambulation   Toileting- Water quality scientist and Hygiene: Min guard;Sit to/from Nurse, children's Details (indicate cue type and reason): Discussed technique for walk-in shower transfers   General ADL Comments: Pt plans to go home alone with friends assisting 24/7. Discussed use of AE to increase independence with LB ADLs, plan to practice with pt prior to d/c home as time and schedule allows.    Vision Vision Assessment?: No apparent visual deficits          Pertinent Vitals/Pain Pain Assessment: Faces Faces Pain Scale: Hurts little more Pain Location: R hip Pain Descriptors / Indicators: Aching;Sore;Discomfort;Guarding Pain Intervention(s): Monitored during session;Repositioned     Hand Dominance Right   Extremity/Trunk Assessment Upper Extremity Assessment Upper Extremity Assessment: Overall WFL for tasks assessed   Lower Extremity Assessment Lower Extremity Assessment: Defer to PT evaluation   Cervical / Trunk Assessment Cervical / Trunk Assessment: Normal   Communication Communication Communication: No difficulties   Cognition Arousal/Alertness: Awake/alert Behavior During Therapy: WFL for tasks assessed/performed Overall Cognitive Status: Within Functional Limits for tasks assessed              Home Living Family/patient expects to be discharged to:: Private residence Living Arrangements: Alone Available Help at Discharge: Friend(s) Type of Home: House Home Access: Stairs to enter Technical brewer of Steps: 2+1 Entrance Stairs-Rails: Right;None Home Layout: One level     Bathroom Shower/Tub: Walk-in shower;Door   [employer] Toilet: Standard     Home Equipment: Environmental consultant - 2  wheels;Cane - single  point;Bedside commode   Additional Comments: Pt has 24/7 assist of friends      Prior Functioning/Environment Level of Independence: Independent     OT Diagnosis: Generalized weakness;Acute pain   OT Problem List: Decreased strength;Decreased range of motion;Decreased activity tolerance;Impaired balance (sitting and/or standing);Decreased safety awareness;Decreased knowledge of use of DME or AE;Decreased knowledge of precautions;Pain   OT Treatment/Interventions: Self-care/ADL training;Therapeutic exercise;Energy conservation;DME and/or AE instruction;Therapeutic activities;Patient/family education;Balance training    OT Goals(Current goals can be found in the care plan section) Acute Rehab OT Goals Patient Stated Goal: go home tomorrow  OT Goal Formulation: With patient Time For Goal Achievement: 04/29/16 Potential to Achieve Goals: Good ADL Goals Pt Will Perform Grooming: with modified independence;standing Pt Will Perform Lower Body Bathing: with modified independence;sit to/from stand;with adaptive equipment Pt Will Perform Lower Body Dressing: with modified independence;sit to/from stand;with adaptive equipment Pt Will Transfer to Toilet: with modified independence;bedside commode Pt Will Perform Tub/Shower Transfer: Shower transfer;rolling walker;3 in 1;ambulating;with modified independence  OT Frequency: Min 2X/week   Barriers to D/C: None known at this time   End of Session Equipment Utilized During Treatment: Gait belt;Rolling walker Nurse Communication: Mobility status  Activity Tolerance: Patient tolerated treatment well Patient left: in chair;with call bell/phone within reach;with chair alarm set   Time: [id number] OT Time Calculation (min): 22 min Charges:  OT General Charges $OT Visit: 1 Procedure OT Evaluation $OT Eval Moderate Complexity: 1 Procedure  Chrys Racer , MS, OTR/L, CLT  04/22/2016, 2:45 PM

## 2016-04-23 LAB — CBC
HEMATOCRIT: 26.1 % — AB (ref 36.0–46.0)
HEMOGLOBIN: 8.7 g/dL — AB (ref 12.0–15.0)
MCH: 31.1 pg (ref 26.0–34.0)
MCHC: 33.3 g/dL (ref 30.0–36.0)
MCV: 93.2 fL (ref 78.0–100.0)
Platelets: 152 10*3/uL (ref 150–400)
RBC: 2.8 MIL/uL — ABNORMAL LOW (ref 3.87–5.11)
RDW: 13.8 % (ref 11.5–15.5)
WBC: 9.3 10*3/uL (ref 4.0–10.5)

## 2016-04-23 NOTE — Progress Notes (Signed)
Subjective: 2 Days Post-Op Procedure(s) (LRB): RIGHT TOTAL HIP ARTHROPLASTY ANTERIOR APPROACH (Right) Patient reports pain as moderate.  Feels better today.  Has been a little hypotensive (minimal), but asymptomatic acute blood loss anemia.  Objective: Vital signs in last 24 hours: Temp:  [98 F (36.7 C)-100.8 F (38.2 C)] 100.8 F (38.2 C) (06/25 0422) Pulse Rate:  [51-78] 75 (06/25 0422) Resp:  [15-18] 16 (06/25 0422) BP: (84-129)/(34-49) 126/49 mmHg (06/25 0422) SpO2:  [95 %-99 %] 98 % (06/25 0422)  Intake/Output from previous day: 06/24 0701 - 06/25 0700 In: 1230 [P.O.:840; I.V.:390] Out: 1925 [Urine:1925] Intake/Output this shift: Total I/O In: 750 [P.O.:360; I.V.:390] Out: 1175 [Urine:1175]   Recent Labs  04/22/16 0414 04/23/16 0442  HGB 9.6* 8.7*    Recent Labs  04/22/16 0414 04/23/16 0442  WBC 9.3 9.3  RBC 3.16* 2.80*  HCT 28.8* 26.1*  PLT 168 152    Recent Labs  04/22/16 0414  NA 137  K 4.1  CL 106  CO2 29  BUN 13  CREATININE 0.58  GLUCOSE 126*  CALCIUM 8.3*   No results for input(s): LABPT, INR in the last 72 hours.  Sensation intact distally Intact pulses distally Dorsiflexion/Plantar flexion intact Incision: scant drainage  Assessment/Plan: 2 Days Post-Op Procedure(s) (LRB): RIGHT TOTAL HIP ARTHROPLASTY ANTERIOR APPROACH (Right) Up with therapy Plan for discharge tomorrow Discharge home with home health  Mcarthur Rossetti 04/23/2016, 6:52 AM

## 2016-04-23 NOTE — Progress Notes (Signed)
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  Reviewed therex, stairs and car transfers in preparation for dc home.  Follow Up Recommendations  Home health PT     Equipment Recommendations  None recommended by PT    Recommendations for Other Services OT consult     Precautions / Restrictions Precautions Precautions: Fall Restrictions Weight Bearing Restrictions: No RLE Weight Bearing: Weight bearing as tolerated    Mobility  Bed Mobility Overal bed mobility: Needs Assistance Bed Mobility: Sit to Supine       Sit to supine: Min guard   General bed mobility comments: cues for sequence.  Pt self assisting R LE with L LE.   Pt from recliner, to/from [place], and to/from EOB  Transfers Overall transfer level: Needs assistance Equipment used: Rolling walker (2 wheeled) Transfers: Sit to/from Stand Sit to Stand: Supervision         General transfer comment: Pt self cueing for LE management and use of UEs   Ambulation/Gait Ambulation/Gait assistance: Min guard;Supervision Ambulation Distance (Feet): 500 Feet (and 20' back from bathroom) Assistive device: Rolling walker (2 wheeled) Gait Pattern/deviations: Step-through pattern;Shuffle;Trunk flexed Gait velocity: decr Gait velocity interpretation: Below normal speed for age/gender General Gait Details: min cues for posture, position from RW and initial sequence   Stairs Stairs: Yes Stairs assistance: Min assist Stair Management: One rail Right;Step to pattern;Forwards;With cane Number of Stairs: 5 General stair comments: cues for sequence and foot/cane placement  Wheelchair Mobility    Modified Rankin (Stroke Patients Only)       Balance                                    Cognition  Arousal/Alertness: Awake/alert Behavior During Therapy: WFL for tasks assessed/performed Overall Cognitive Status: Within Functional Limits for tasks assessed                      Exercises Total Joint Exercises Ankle Circles/Pumps: AROM;Both;15 reps;Supine Quad Sets: AROM;Both;10 reps;Supine Heel Slides: AAROM;Left;20 reps;Supine Hip ABduction/ADduction: AAROM;Left;15 reps;Supine    General Comments        Pertinent Vitals/Pain Pain Assessment: 0-10 Pain Score: 3  Pain Location: R hip Pain Descriptors / Indicators: Sore Pain Intervention(s): Limited activity within patient's tolerance;Monitored during session    Home Living                      Prior Function            PT Goals (current goals can now be found in the care plan section) Acute Rehab PT Goals Patient Stated Goal: go home tomorrow  PT Goal Formulation: With patient Time For Goal Achievement: 04/25/16 Potential to Achieve Goals: Good Progress towards PT goals: Progressing toward goals    Frequency  7X/week    PT Plan Current plan remains appropriate    Co-evaluation             End of Session Equipment Utilized During Treatment: Gait belt Activity Tolerance: Patient tolerated treatment well Patient left: in bed;with call bell/phone within reach     Time: 1442-1526 PT Time Calculation (min) (ACUTE ONLY): 44 min  Charges:  $Gait  Training: 8-22 mins $Therapeutic Exercise: 8-22 mins $Therapeutic Activity: 8-22 mins                    G Codes:      [name] 27-Apr-2016, 4:08 PM

## 2016-04-23 NOTE — Progress Notes (Signed)
Patients BP is low. Patient explains she did not eat and drink very much during the day. Nurse restarted IV fluids.

## 2016-04-23 NOTE — Progress Notes (Signed)
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  Follow Up Recommendations  Home health PT     Equipment Recommendations  None recommended by PT    Recommendations for Other Services OT consult     Precautions / Restrictions Precautions Precautions: Fall Restrictions Weight Bearing Restrictions: No RLE Weight Bearing: Weight bearing as tolerated    Mobility  Bed Mobility               General bed mobility comments: NT - pt OOB and declines back to bed  Transfers Overall transfer level: Needs assistance Equipment used: Rolling walker (2 wheeled) Transfers: Sit to/from Stand Sit to Stand: Min guard         General transfer comment: min guard to rised and steady on initial standing  Ambulation/Gait Ambulation/Gait assistance: Min guard Ambulation Distance (Feet): 250 Feet Assistive device: Rolling walker (2 wheeled) Gait Pattern/deviations: Step-to pattern;Step-through pattern;Shuffle;Trunk flexed     General Gait Details: min cues for posture, position from RW and initial sequence   Stairs            Wheelchair Mobility    Modified Rankin (Stroke Patients Only)       Balance                                    Cognition Arousal/Alertness: Awake/alert Behavior During Therapy: WFL for tasks assessed/performed Overall Cognitive Status: Within Functional Limits for tasks assessed                      Exercises Total Joint Exercises Ankle Circles/Pumps: AROM;Both;15 reps;Supine Quad Sets: AROM;Both;10 reps;Supine Heel Slides: AAROM;Left;20 reps;Supine Hip ABduction/ADduction: AAROM;Left;15 reps;Supine Long Arc Quad: AROM;Right;10 reps;Seated    General Comments        Pertinent Vitals/Pain Pain Assessment: 0-10 Pain  Score: 3  Pain Location: R hip Pain Descriptors / Indicators: Sore Pain Intervention(s): Limited activity within patient's tolerance;Monitored during session;Ice applied    Home Living                      Prior Function            PT Goals (current goals can now be found in the care plan section) Acute Rehab PT Goals Patient Stated Goal: go home tomorrow  PT Goal Formulation: With patient Time For Goal Achievement: 04/25/16 Potential to Achieve Goals: Good Progress towards PT goals: Progressing toward goals    Frequency  7X/week    PT Plan Current plan remains appropriate    Co-evaluation             End of Session Equipment Utilized During Treatment: Gait belt Activity Tolerance: Patient tolerated treatment well Patient left: in chair;with call bell/phone within reach     Time: 0842-0908 PT Time Calculation (min) (ACUTE ONLY): 26 min  Charges:  $Gait Training: 8-22 mins $Therapeutic Exercise: 8-22 mins                    G Codes:      [name] 18-May-2016, 9:11 AM

## 2016-04-23 NOTE — Care Management Note (Signed)
Case Management Note  Patient Details  Name: [name] MRN: [id number] Date of Birth: 1940/11/12  Subjective/Objective:  Right Total Hip Arthroplasty                Action/Plan: Discharge Planning: AVS reviewed:  NCM spoke to pt and she has RW at home. Offered choice for [facility]. Pt agreeable to [facility] for [facility].  PCP -- Lajean Manes MD    Expected Discharge Date:  04/23/2016               Expected Discharge Plan:  [place]  In-House Referral:  NA  Discharge planning Services  CM Consult  Post Acute Care Choice:  Home Health Choice offered to:  Patient  DME Arranged:  N/A DME Agency:  NA  HH Arranged:  PT [place] Agency:  [facility] (now [facility])  Status of Service:  Completed, signed off  If discussed at [place] of Stay Meetings, dates discussed:    Additional Comments:  Erenest Rasher, RN 04/23/2016, 12:29 PM

## 2016-04-23 NOTE — Progress Notes (Signed)
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 No dizziness noted  Follow Up Recommendations  No OT follow up;Supervision/Assistance - 24 hour    Equipment Recommendations  Other (comment) (possibly AE)    Recommendations for Other Services      Precautions / Restrictions Precautions Precautions: Fall Restrictions Weight Bearing Restrictions: No RLE Weight Bearing: Weight bearing as tolerated       Mobility Bed Mobility               General bed mobility comments: pt in chair  Transfers Overall transfer level: Needs assistance Equipment used: Rolling walker (2 wheeled) Transfers: Sit to/from [employer] Transfers Sit to Stand: Supervision         General transfer comment: min guard to rised and steady on initial standing    Balance                                   ADL Overall ADL's : Needs assistance/impaired                     Lower Body Dressing: Minimal assistance;Sit to/from stand;Cueing for sequencing;Cueing for compensatory techniques   Toilet Transfer: Min guard;RW;Comfort height toilet   Toileting- Water quality scientist and Hygiene: Min guard;Sit to/from stand;Cueing for sequencing;Cueing for safety     Tub/Shower Transfer Details (indicate cue type and reason): Discussed technique for walk-in shower transfers                    Cognition   Behavior During Therapy: [facility] for tasks assessed/performed Overall Cognitive Status: Within Functional Limits for tasks assessed                       Extremity/Trunk Assessment               Exercises Total Joint Exercises Ankle Circles/Pumps: AROM;Both;15 reps;Supine Quad Sets: AROM;Both;10 reps;Supine Heel Slides: AAROM;Left;20 reps;Supine Hip ABduction/ADduction: AAROM;Left;15  reps;Supine Long Arc Quad: AROM;Right;10 reps;Seated   Shoulder Instructions       General Comments      Pertinent Vitals/ Pain       Pain Assessment: 0-10 Pain Score: 3  Faces Pain Scale: Hurts a little bit Pain Location: R hip Pain Descriptors / Indicators: Sore Pain Intervention(s): Monitored during session  Home Living                                          Prior Functioning/Environment              Frequency Min 2X/week     Progress Toward Goals  OT Goals(current goals can now be found in the care plan section)  Progress towards OT goals: Progressing toward goals  Acute Rehab OT Goals Patient Stated Goal: go home tomorrow   Plan Discharge plan remains appropriate    Co-evaluation                 End of Session Equipment Utilized During Treatment: Gait belt;Rolling walker   Activity Tolerance Patient tolerated treatment well   Patient Left in chair;with call bell/phone within reach;with chair alarm set   Nurse  Communication Mobility status        Time: [id number] OT Time Calculation (min): 17 min  Charges: OT General Charges $OT Visit: 1 Procedure OT Treatments $Self Care/Home Management : 8-22 mins  Takoda Siedlecki, Thereasa Parkin 04/23/2016, 12:01 PM

## 2016-04-24 LAB — CBC
HCT: 25.8 % — ABNORMAL LOW (ref 36.0–46.0)
Hemoglobin: 8.5 g/dL — ABNORMAL LOW (ref 12.0–15.0)
MCH: 30.9 pg (ref 26.0–34.0)
MCHC: 32.9 g/dL (ref 30.0–36.0)
MCV: 93.8 fL (ref 78.0–100.0)
PLATELETS: 168 10*3/uL (ref 150–400)
RBC: 2.75 MIL/uL — AB (ref 3.87–5.11)
RDW: 13.9 % (ref 11.5–15.5)
WBC: 8.8 10*3/uL (ref 4.0–10.5)

## 2016-04-24 MED ORDER — ASPIRIN 325 MG PO TBEC: 325.0000 mg | DELAYED_RELEASE_TABLET | Freq: Every day | ORAL | Status: DC

## 2016-04-24 MED ORDER — METHOCARBAMOL 500 MG PO TABS: 500.0000 mg | ORAL_TABLET | Freq: Four times a day (QID) | ORAL | Status: DC | PRN

## 2016-04-24 MED ORDER — CELECOXIB 200 MG PO CAPS: 200.0000 mg | ORAL_CAPSULE | Freq: Two times a day (BID) | ORAL | Status: DC

## 2016-04-24 NOTE — Progress Notes (Signed)
Subjective: 3 Days Post-Op Procedure(s) (LRB): RIGHT TOTAL HIP ARTHROPLASTY ANTERIOR APPROACH (Right) Patient reports pain as mild.  No complaints ready for d/c to home.  Objective: Vital signs in last 24 hours: Temp:  [98.5 F (36.9 C)-98.9 F (37.2 C)] 98.5 F (36.9 C) (06/26 0530) Pulse Rate:  [67-75] 67 (06/26 0530) Resp:  [16-18] 18 (06/26 0530) BP: (114-123)/(41-62) 122/62 mmHg (06/26 0530) SpO2:  [97 %-100 %] 97 % (06/26 0530)  Intake/Output from previous day: 06/25 0701 - 06/26 0700 In: 1080 [P.O.:1080] Out: 0  Intake/Output this shift:     Recent Labs  04/22/16 0414 04/23/16 0442 04/24/16 0421  HGB 9.6* 8.7* 8.5*    Recent Labs  04/23/16 0442 04/24/16 0421  WBC 9.3 8.8  RBC 2.80* 2.75*  HCT 26.1* 25.8*  PLT 152 168    Recent Labs  04/22/16 0414  NA 137  K 4.1  CL 106  CO2 29  BUN 13  CREATININE 0.58  GLUCOSE 126*  CALCIUM 8.3*   No results for input(s): LABPT, INR in the last 72 hours.  Incision: dressing C/D/I  Assessment/Plan: 3 Days Post-Op Procedure(s) (LRB): RIGHT TOTAL HIP ARTHROPLASTY ANTERIOR APPROACH (Right) Up with therapy Discharge home with home health  [place] 04/24/2016, 8:03 AM

## 2016-04-24 NOTE — Progress Notes (Signed)
Occupational Therapy Treatment Patient Details Name: [name] MRN: [id number] DOB: [birth year] Today's Date: 04/24/2016    History of present illness [name]; Pt s/p L THR (13)   OT comments  Plan is to DC today. Pt doing well with ADL activity. Friend will A as needed  Follow Up Recommendations  No OT follow up;Supervision/Assistance - 24 hour    Equipment Recommendations  Other (comment) (possibly AE)    Recommendations for Other Services      Precautions / Restrictions Precautions Precautions: Fall Restrictions Weight Bearing Restrictions: No RLE Weight Bearing: Weight bearing as tolerated       Mobility Bed Mobility               General bed mobility comments: pt in chair  Transfers Overall transfer level: Needs assistance Equipment used: Rolling walker (2 wheeled) Transfers: Sit to/from [facility] Sit to Stand: Supervision Stand pivot transfers: Supervision                ADL   Eating/Feeding: Set up;Sitting   Grooming: Standing;Supervision/safety;Wash/dry hands           Upper Body Dressing : Set up;Sitting   Lower Body Dressing: Sit to/from stand;Cueing for sequencing;Cueing for compensatory techniques;Supervision/safety   Toilet Transfer: Supervision/safety;RW;Ambulation;Comfort height toilet   Toileting- Clothing Manipulation and Hygiene: Supervision/safety;Sit to/from stand   Tub/ Shower Transfer: Supervision/safety;Ambulation Tub/Shower Transfer Details (indicate cue type and reason): VC for safety Functional mobility during ADLs: Supervision/safety;Rolling walker                  Cognition   Behavior During Therapy: WFL for tasks assessed/performed Overall Cognitive Status: Within Functional Limits for tasks assessed                               General Comments      Pertinent Vitals/ Pain       Pain Score: 2  Pain Location: R hip Pain Descriptors / Indicators: Sore Pain  Intervention(s): Monitored during session     Prior Functioning/Environment              Frequency Min 2X/week     Progress Toward Goals  OT Goals(current goals can now be found in the care plan section)  Progress towards OT goals: Progressing toward goals     Plan Discharge plan remains appropriate       End of Session Equipment Utilized During Treatment: Gait belt;Rolling walker   Activity Tolerance Patient tolerated treatment well   Patient Left in chair;with call bell/phone within reach;with chair alarm set   Nurse Communication Mobility status        Time: [id number] OT Time Calculation (min): 24 min  Charges: OT General Charges $OT Visit: 1 Procedure OT Treatments $Self Care/Home Management : 23-37 mins  [name], [name] 04/24/2016, 11:25 AM

## 2016-04-24 NOTE — Discharge Summary (Signed)
Patient ID: [name] MRN: [id number] DOB/AGE: 31-Dec-1940 75 y.o.  Admit date: 04/21/2016 Discharge date: 04/24/2016  Admission Diagnoses:  Principal Problem:   Osteoarthritis of right hip Active Problems:   Status post total replacement of right hip   Discharge Diagnoses:  Same  Past Medical History  Diagnosis Date  . Anxiety   . Depression   . 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 Osteopenia   . Complication of anesthesia     "felt it made her feel very goofy for several months"  . History of kidney stones     x1 episode  . Back pain     generally "causes severe stiffness"  . Arthritis     lower back, hip    Surgeries: Procedure(s): RIGHT TOTAL HIP ARTHROPLASTY ANTERIOR APPROACH on 04/21/2016   Consultants:  PT  Discharged Condition: Improved  Hospital Course: [name] is an 75 y.o. female who was admitted 04/21/2016 for operative treatment ofOsteoarthritis of right hip. Patient has severe unremitting pain that affects sleep, daily activities, and work/hobbies. After pre-op clearance the patient was taken to the operating room on 04/21/2016 and underwent  Procedure(s): RIGHT TOTAL HIP ARTHROPLASTY ANTERIOR APPROACH.    Patient was given perioperative antibiotics: Anti-infectives    Start     Dose/Rate Route Frequency Ordered Stop   04/21/16 1600  ceFAZolin (ANCEF) IVPB 1 g/50 mL premix     1 g 100 mL/hr over 30 Minutes Intravenous Every 6 hours 04/21/16 1255 04/21/16 2158   04/21/16 0737  ceFAZolin (ANCEF) IVPB 2g/100 mL premix     2 g 200 mL/hr over 30 Minutes Intravenous On call to O.R. 04/21/16 0737 04/21/16 1005       Patient was given sequential compression devices, early ambulation, and chemoprophylaxis to prevent DVT.  Patient benefited maximally from hospital stay and there were no complications.    Recent vital signs: Patient Vitals for the past 24 hrs:  BP Temp Temp src Pulse Resp SpO2  04/24/16 0530 122/62 mmHg 98.5 F (36.9 C) Oral 67  18 97 %  04/23/16 2315 (!) 123/48 mmHg 98.9 F (37.2 C) Oral 73 18 97 %  04/23/16 1408 (!) 114/41 mmHg 98.7 F (37.1 C) Oral 75 16 100 %     Recent laboratory studies:  Recent Labs  04/22/16 0414 04/23/16 0442 04/24/16 0421  WBC 9.3 9.3 8.8  HGB 9.6* 8.7* 8.5*  HCT 28.8* 26.1* 25.8*  PLT 168 152 168  NA 137  --   --   K 4.1  --   --   CL 106  --   --   CO2 29  --   --   BUN 13  --   --   CREATININE 0.58  --   --   GLUCOSE 126*  --   --   CALCIUM 8.3*  --   --      Discharge Medications:     Medication List    STOP taking these medications        ALEVE 220 MG tablet  Generic drug:  naproxen sodium      TAKE these medications        ALPRAZolam 0.25 MG tablet  Commonly known as:  XANAX  Take 0.25 mg by mouth as needed for anxiety.     aspirin 325 MG EC tablet  Take 1 tablet (325 mg total) by mouth daily with breakfast.     CALCIUM + D3 PO  Take 1 tablet  by mouth daily.     celecoxib 200 MG capsule  Commonly known as:  CELEBREX  Take 1 capsule (200 mg total) by mouth every 12 (twelve) hours.     citalopram 40 MG tablet  Commonly known as:  CELEXA  Take 40 mg by mouth at bedtime.     fluorouracil 5 % cream  Commonly known as:  EFUDEX  Apply 1 application topically as needed (Apply to spots on skin.).     GLUCOSAMINE CHONDROITIN JOINT Tabs  Take 1 tablet by mouth daily.     methocarbamol 500 MG tablet  Commonly known as:  ROBAXIN  Take 1 tablet (500 mg total) by mouth every 6 (six) hours as needed for muscle spasms.     multivitamin with minerals Tabs tablet  Take 1 tablet by mouth daily.     SALONPAS PAIN RELIEF PATCH EX  Place 1 patch onto the skin as needed (For pain.).     SYSTANE OP  Place 1 drop into both eyes daily as needed (For dry eyes.).     traMADol 50 MG tablet  Commonly known as:  ULTRAM  Take 50 mg by mouth every 6 (six) hours as needed (For pain.).     VOLTAREN 1 % Gel  Generic drug:  diclofenac sodium  Apply 1 application  topically 4 (four) times daily as needed (For pain.).        Diagnostic Studies: Dg C-arm 1-60 Min-no Report  04/21/2016  CLINICAL DATA: surgery C-ARM 1-60 MINUTES Fluoroscopy was utilized by the requesting physician.  No radiographic interpretation.   Dg Hip Port Unilat With Pelvis 1v Right  04/21/2016  CLINICAL DATA:  Right total hip arthroplasty EXAM: DG HIP (WITH OR WITHOUT PELVIS) 1V PORT RIGHT COMPARISON:  03/14/2016 pelvic radiograph FINDINGS: Patient is status post interval right total hip arthroplasty, with well-positioned right acetabular and right proximal femoral prostheses. No hardware fracture or loosening. No right hip dislocation. No osseous fracture or suspicious focal osseous lesion. Stable left total hip arthroplasty, with no left hip hardware fracture or loosening. Expected soft tissue gas surrounding the right hip joint. IMPRESSION: Satisfactory appearance status post interval right total hip arthroplasty. Electronically Signed   By: Ilona Sorrel M.D.   On: 04/21/2016 12:06   Dg Hip Operative Unilat With Pelvis Right  04/21/2016  CLINICAL DATA:  Right total hip replacement EXAM: OPERATIVE RIGHT HIP (WITH PELVIS IF PERFORMED) 2 VIEWS TECHNIQUE: Fluoroscopic spot image(s) were submitted for interpretation post-operatively. COMPARISON:  None. FINDINGS: Two intraoperative spot images demonstrate changes of right hip replacement. Normal AP alignment. No hardware or bony complicating feature noted. IMPRESSION: Right hip replacement. No visualized complicating feature on these intraoperative spot images. Electronically Signed   By: Rolm Baptise M.D.   On: 04/21/2016 11:15    Disposition: 06-Home-Health Care Svc        Follow-up Information    Follow up with [facility].   Why:  Home Health Physical Therapy   Contact information:   [place] [address] [phone]       Follow up with Mcarthur Rossetti, MD. Schedule an appointment as  soon as possible for a visit in 2 weeks.   Specialty:  Orthopedic Surgery   Contact information:   [place] [address] [phone]        Signed: Erskine Emery 04/24/2016, 8:02 AM

## 2016-04-24 NOTE — Discharge Instructions (Signed)

## 2016-04-24 NOTE — Progress Notes (Signed)
Physical Therapy Treatment Patient Details Name: [name] MRN: [id number] DOB: 11-27-1940 Today's Date: 04/24/2016    History of Present Illness [name]; Pt s/p L THR (13)    PT Comments    POD # 3 Assisted with amb in hallway, practiced one step then performed all THR TE's following HEP handout.  Instructed on proper tech and freq as well as use of ICE.   Pt ready for D/C to home.   Follow Up Recommendations  Home health PT     Equipment Recommendations  None recommended by PT    Recommendations for Other Services       Precautions / Restrictions Precautions Precautions: Fall Restrictions Weight Bearing Restrictions: No RLE Weight Bearing: Weight bearing as tolerated    Mobility  Bed Mobility               General bed mobility comments: pt in chair  Transfers Overall transfer level: Needs assistance Equipment used: Rolling walker (2 wheeled) Transfers: Sit to/from Stand Sit to Stand: Supervision Stand pivot transfers: Supervision       General transfer comment: Pt self cueing for LE management and use of UEs   Ambulation/Gait Ambulation/Gait assistance: Supervision Ambulation Distance (Feet): 85 Feet Assistive device: Rolling walker (2 wheeled)   Gait velocity: decr   General Gait Details: min cues for posture, position from RW and initial sequence   Stairs Stairs: Yes Stairs assistance: Min guard Stair Management: No rails;Step to pattern;Forwards Number of Stairs: 1 General stair comments: one initial VC on proper walker placement and sequencing  Wheelchair Mobility    Modified Rankin (Stroke Patients Only)       Balance                                    Cognition Arousal/Alertness: Awake/alert Behavior During Therapy: WFL for tasks assessed/performed Overall Cognitive Status: Within Functional Limits for tasks assessed                      Exercises   Total Hip Replacement TE's 10 reps ankle pumps 10  reps knee presses 10 reps heel slides 10 reps SAQ's 10 reps ABD Followed by ICE     General Comments        Pertinent Vitals/Pain Pain Assessment: 0-10 Pain Score: 2  Pain Location: R hip Pain Descriptors / Indicators: Sore;Tender Pain Intervention(s): Monitored during session;Repositioned;Ice applied    Home Living                      Prior Function            PT Goals (current goals can now be found in the care plan section) Progress towards PT goals: Progressing toward goals    Frequency  7X/week    PT Plan Current plan remains appropriate    Co-evaluation             End of Session Equipment Utilized During Treatment: Gait belt Activity Tolerance: Patient tolerated treatment well Patient left: in chair;with call bell/phone within reach     Time: 0842-0911 PT Time Calculation (min) (ACUTE ONLY): 29 min  Charges:  $Gait Training: 8-22 mins $Therapeutic Activity: 8-22 mins                    G Codes:     Rica Koyanagi  PTA [facility]  Acute  Rehab Pager  [phone]  

## 2016-04-25 DIAGNOSIS — Z87891 Personal history of nicotine dependence: Secondary | ICD-10-CM | POA: Diagnosis not present

## 2016-04-25 DIAGNOSIS — Z79891 Long term (current) use of opiate analgesic: Secondary | ICD-10-CM | POA: Diagnosis not present

## 2016-04-25 DIAGNOSIS — Z87442 Personal history of urinary calculi: Secondary | ICD-10-CM | POA: Diagnosis not present

## 2016-04-25 DIAGNOSIS — F329 Major depressive disorder, single episode, unspecified: Secondary | ICD-10-CM | POA: Diagnosis not present

## 2016-04-25 DIAGNOSIS — M8580 Other specified disorders of bone density and structure, unspecified site: Secondary | ICD-10-CM | POA: Diagnosis not present

## 2016-04-25 DIAGNOSIS — Z96643 Presence of artificial hip joint, bilateral: Secondary | ICD-10-CM | POA: Diagnosis not present

## 2016-04-25 DIAGNOSIS — Z791 Long term (current) use of non-steroidal anti-inflammatories (NSAID): Secondary | ICD-10-CM | POA: Diagnosis not present

## 2016-04-25 DIAGNOSIS — Z471 Aftercare following joint replacement surgery: Secondary | ICD-10-CM | POA: Diagnosis not present

## 2016-04-25 DIAGNOSIS — Z7982 Long term (current) use of aspirin: Secondary | ICD-10-CM | POA: Diagnosis not present

## 2016-04-25 DIAGNOSIS — F419 Anxiety disorder, unspecified: Secondary | ICD-10-CM | POA: Diagnosis not present

## 2016-05-01 DIAGNOSIS — F329 Major depressive disorder, single episode, unspecified: Secondary | ICD-10-CM | POA: Diagnosis not present

## 2016-05-01 DIAGNOSIS — Z87891 Personal history of nicotine dependence: Secondary | ICD-10-CM | POA: Diagnosis not present

## 2016-05-01 DIAGNOSIS — Z791 Long term (current) use of non-steroidal anti-inflammatories (NSAID): Secondary | ICD-10-CM | POA: Diagnosis not present

## 2016-05-01 DIAGNOSIS — Z471 Aftercare following joint replacement surgery: Secondary | ICD-10-CM | POA: Diagnosis not present

## 2016-05-01 DIAGNOSIS — M8580 Other specified disorders of bone density and structure, unspecified site: Secondary | ICD-10-CM | POA: Diagnosis not present

## 2016-05-01 DIAGNOSIS — Z87442 Personal history of urinary calculi: Secondary | ICD-10-CM | POA: Diagnosis not present

## 2016-05-01 DIAGNOSIS — Z96643 Presence of artificial hip joint, bilateral: Secondary | ICD-10-CM | POA: Diagnosis not present

## 2016-05-01 DIAGNOSIS — F419 Anxiety disorder, unspecified: Secondary | ICD-10-CM | POA: Diagnosis not present

## 2016-05-01 DIAGNOSIS — Z79891 Long term (current) use of opiate analgesic: Secondary | ICD-10-CM | POA: Diagnosis not present

## 2016-05-01 DIAGNOSIS — Z7982 Long term (current) use of aspirin: Secondary | ICD-10-CM | POA: Diagnosis not present

## 2016-05-04 DIAGNOSIS — M1611 Unilateral primary osteoarthritis, right hip: Secondary | ICD-10-CM | POA: Diagnosis not present

## 2016-05-08 DIAGNOSIS — Z87442 Personal history of urinary calculi: Secondary | ICD-10-CM | POA: Diagnosis not present

## 2016-05-08 DIAGNOSIS — Z79891 Long term (current) use of opiate analgesic: Secondary | ICD-10-CM | POA: Diagnosis not present

## 2016-05-08 DIAGNOSIS — Z7982 Long term (current) use of aspirin: Secondary | ICD-10-CM | POA: Diagnosis not present

## 2016-05-08 DIAGNOSIS — Z96643 Presence of artificial hip joint, bilateral: Secondary | ICD-10-CM | POA: Diagnosis not present

## 2016-05-08 DIAGNOSIS — M8580 Other specified disorders of bone density and structure, unspecified site: Secondary | ICD-10-CM | POA: Diagnosis not present

## 2016-05-08 DIAGNOSIS — Z87891 Personal history of nicotine dependence: Secondary | ICD-10-CM | POA: Diagnosis not present

## 2016-05-08 DIAGNOSIS — F419 Anxiety disorder, unspecified: Secondary | ICD-10-CM | POA: Diagnosis not present

## 2016-05-08 DIAGNOSIS — Z791 Long term (current) use of non-steroidal anti-inflammatories (NSAID): Secondary | ICD-10-CM | POA: Diagnosis not present

## 2016-05-08 DIAGNOSIS — F329 Major depressive disorder, single episode, unspecified: Secondary | ICD-10-CM | POA: Diagnosis not present

## 2016-05-08 DIAGNOSIS — Z471 Aftercare following joint replacement surgery: Secondary | ICD-10-CM | POA: Diagnosis not present

## 2016-05-23 DIAGNOSIS — D0471 Carcinoma in situ of skin of right lower limb, including hip: Secondary | ICD-10-CM | POA: Diagnosis not present

## 2016-05-23 DIAGNOSIS — D485 Neoplasm of uncertain behavior of skin: Secondary | ICD-10-CM | POA: Diagnosis not present

## 2016-05-23 DIAGNOSIS — L570 Actinic keratosis: Secondary | ICD-10-CM | POA: Diagnosis not present

## 2016-06-21 DIAGNOSIS — D0471 Carcinoma in situ of skin of right lower limb, including hip: Secondary | ICD-10-CM | POA: Diagnosis not present

## 2016-06-21 DIAGNOSIS — L570 Actinic keratosis: Secondary | ICD-10-CM | POA: Diagnosis not present

## 2016-06-28 DIAGNOSIS — M25551 Pain in right hip: Secondary | ICD-10-CM | POA: Diagnosis not present

## 2016-07-14 DIAGNOSIS — H5213 Myopia, bilateral: Secondary | ICD-10-CM | POA: Diagnosis not present

## 2016-07-14 DIAGNOSIS — H04123 Dry eye syndrome of bilateral lacrimal glands: Secondary | ICD-10-CM | POA: Diagnosis not present

## 2016-07-14 DIAGNOSIS — H43813 Vitreous degeneration, bilateral: Secondary | ICD-10-CM | POA: Diagnosis not present

## 2016-07-14 DIAGNOSIS — H25013 Cortical age-related cataract, bilateral: Secondary | ICD-10-CM | POA: Diagnosis not present

## 2016-08-04 DIAGNOSIS — Z23 Encounter for immunization: Secondary | ICD-10-CM | POA: Diagnosis not present

## 2016-08-04 DIAGNOSIS — F330 Major depressive disorder, recurrent, mild: Secondary | ICD-10-CM | POA: Diagnosis not present

## 2016-08-04 DIAGNOSIS — G8929 Other chronic pain: Secondary | ICD-10-CM | POA: Diagnosis not present

## 2016-08-04 DIAGNOSIS — M25562 Pain in left knee: Secondary | ICD-10-CM | POA: Diagnosis not present

## 2016-08-28 ENCOUNTER — Other Ambulatory Visit: Payer: Self-pay | Admitting: Geriatric Medicine

## 2016-08-28 DIAGNOSIS — Z1231 Encounter for screening mammogram for malignant neoplasm of breast: Secondary | ICD-10-CM

## 2016-09-28 ENCOUNTER — Ambulatory Visit: Payer: PPO

## 2016-10-02 ENCOUNTER — Encounter (INDEPENDENT_AMBULATORY_CARE_PROVIDER_SITE_OTHER): Payer: Self-pay

## 2016-10-02 ENCOUNTER — Ambulatory Visit (INDEPENDENT_AMBULATORY_CARE_PROVIDER_SITE_OTHER): Payer: PPO | Admitting: Orthopaedic Surgery

## 2016-10-02 DIAGNOSIS — M25561 Pain in right knee: Secondary | ICD-10-CM

## 2016-10-02 MED ORDER — METHYLPREDNISOLONE ACETATE 40 MG/ML IJ SUSP
40.0000 mg | INTRAMUSCULAR | Status: AC | PRN
  Administered 2016-10-02: 40 mg via INTRA_ARTICULAR

## 2016-10-02 MED ORDER — LIDOCAINE HCL 1 % IJ SOLN
3.0000 mL | INTRAMUSCULAR | Status: AC | PRN
  Administered 2016-10-02: 3 mL

## 2016-10-02 NOTE — Progress Notes (Signed)
Office Visit Note   Patient: [name]           Date of Birth: 30-Sep-1941           MRN: [id number] Visit Date: 10/02/2016              Requested by: Lajean Manes, MD [address] [place], [place] [address] PCP: Mathews Argyle, MD   Assessment & Plan: Visit Diagnoses: No diagnosis found.  Plan: She tolerated the steroid injection well. I will like to see her back in 1 month to see how she doing overall. At that visit I would like a standing AP and lateral of her right knee. I would also like a standing AP pelvis.  Follow-Up Instructions: No Follow-up on file.   Orders:  No orders of the defined types were placed in this encounter.  No orders of the defined types were placed in this encounter.     Procedures: Large Joint Inj Date/Time: 10/02/2016 2:56 PM Performed by: Mcarthur Rossetti Authorized by: Mcarthur Rossetti   Location:  Knee Ultrasound Guidance: No   Fluoroscopic Guidance: No   Arthrogram: No   Medications:  3 mL lidocaine 1 %; 40 mg methylPREDNISolone acetate 40 MG/ML     Clinical Data: No additional findings.   Subjective: Chief Complaint  Patient presents with  . Lower Back - Pain    Patient states her leg discrepancy has really bothered her back. She wonders if there is an issue with her knee? She states she wears  A  1/2 inch heel left in shoe   Her right knee has been bothering her and she points the lateral side. HPI  Review of Systems Denies chest pain, headache, shortness of breath, fever, chills, nausea, vomiting.  Objective: Vital Signs: LMP 10/31/1991   Physical Exam He is alert and oriented 3 in no acute distress Ortho Exam Her right knee seems to have some lateral joint line tenderness and deathly a mild valgus deformity when she stands. We performed bilateral hip replacements on her with the most recent being 5 months ago on the right side. This did cause a leg length discrepancy and she is in  the left on her left foot. Specialty Comments:  No specialty comments available.  Imaging: No results found.   PMFS History: Patient Active Problem List   Diagnosis Date Noted  . 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 Degenerative arthritis of hip 04/19/2012  . Anxiety   . Cancer ([place])   . Depression   . Arthritis   . RENAL CALCULUS, RIGHT 12/15/2008  . ABDOMINAL PAIN, UNSPECIFIED SITE 12/15/2008  . NEPHROLITHIASIS, HX OF 12/15/2008  . ANXIETY DEPRESSION 10/29/2007  . CONTACT DERMATITIS DUE TO SOLAR RADIATION 10/29/2007  . OSTEOARTHROSIS, LOCAL, PRIMARY, UNSPC SITE 08/16/2007   Past Medical History:  Diagnosis Date  . Anxiety   . Arthritis    lower back, hip  . Back pain    generally "causes severe stiffness"  . Complication of anesthesia    "felt it made her feel very goofy for several months"  . Depression   . History of kidney stones    x1 episode  . Osteopenia   . 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 Cancer Father     prostate  . Hypertension Father   . Heart attack Father   .  Cancer Brother     Occupational psychologist  . Cancer Maternal Grandmother     throat  . Breast cancer Paternal Grandmother     Past Surgical History:  Procedure Laterality Date  . APPENDECTOMY  2010   open  . BREAST LUMPECTOMY  1961  . TOTAL HIP ARTHROPLASTY  04/19/2012   Procedure: TOTAL HIP ARTHROPLASTY ANTERIOR APPROACH;  Surgeon: Mcarthur Rossetti, MD;  Location: [facility];  Service: Orthopedics;  Laterality: Left;  Left Total Hip Arthroplasty  . TOTAL HIP ARTHROPLASTY Right 04/21/2016   Procedure: RIGHT TOTAL HIP ARTHROPLASTY ANTERIOR APPROACH;  Surgeon: Mcarthur Rossetti, MD;  Location: [facility];  Service: Orthopedics;  Laterality: Right;   Social History   Occupational History  . Not on file.   Social History Main Topics  . Smoking status: Former Smoker     Quit date: 04/15/1986  . Smokeless tobacco: Never Used  . Alcohol use 3.5 oz/week    7 Standard drinks or equivalent per week     Comment: 1 drink daily  . Drug use: No  . Sexual activity: No

## 2016-10-18 ENCOUNTER — Ambulatory Visit
Admission: RE | Admit: 2016-10-18 | Discharge: 2016-10-18 | Disposition: A | Discharge status: Home / Self Care | Payer: PPO | Source: Ambulatory Visit | Attending: Geriatric Medicine | Admitting: Geriatric Medicine

## 2016-10-18 DIAGNOSIS — Z1231 Encounter for screening mammogram for malignant neoplasm of breast: Secondary | ICD-10-CM

## 2016-10-31 ENCOUNTER — Ambulatory Visit (INDEPENDENT_AMBULATORY_CARE_PROVIDER_SITE_OTHER): Payer: PPO

## 2016-10-31 ENCOUNTER — Ambulatory Visit (INDEPENDENT_AMBULATORY_CARE_PROVIDER_SITE_OTHER): Payer: PPO | Admitting: Orthopaedic Surgery

## 2016-10-31 ENCOUNTER — Other Ambulatory Visit (INDEPENDENT_AMBULATORY_CARE_PROVIDER_SITE_OTHER): Payer: Self-pay

## 2016-10-31 DIAGNOSIS — G8929 Other chronic pain: Secondary | ICD-10-CM | POA: Diagnosis not present

## 2016-10-31 DIAGNOSIS — Z96643 Presence of artificial hip joint, bilateral: Secondary | ICD-10-CM

## 2016-10-31 DIAGNOSIS — M25561 Pain in right knee: Secondary | ICD-10-CM

## 2016-10-31 NOTE — Progress Notes (Signed)
Office Visit Note   Patient: [name]           Date of Birth: 24-Mar-1941           MRN: [id number] Visit Date: 10/31/2016              Requested by: Lajean Manes, MD [address] [place], [place] [address] PCP: Mathews Argyle, MD   Assessment & Plan: Visit Diagnoses:  1. Chronic pain of right knee   2. History of bilateral total hip arthroplasty     Plan: At this point she does wish to try hyaluronic acid injection again in her right knee. She had I hyaluronic acid injections in the past that was rooster comb base and she had a bad allergic reaction to this. Thus we need to try one that is bioengineered. We'll work on ordering this for her and see her back when this injection is been improved.   Orders:  Orders Placed This Encounter  Procedures  . XR Knee 1-2 Views Right  . XR Pelvis 1-2 Views   No orders of the defined types were placed in this encounter.     Procedures: No procedures performed   Clinical Data: No additional findings.   Subjective: No chief complaint on file. She is status post bilateral hip replacements done years apart. Her right was the one that was done 6 months ago. She's had a leg length discrepancy as a result. She also has right knee pain and is tried and failed most forms conservative treatment.  HPI  Review of Systems Denies chest pain, shortness breath, fever, chills, nausea, vomiting. Is alert and oriented 3  Objective: VitalObj Signs: LMP 10/31/1991   Physical Exam  Ortho Exam Both hips show excellent range of motion. She is slowly longer on the right and left. Her right knee has slight valgus deformity with lateral joint line tenderness and patellofemoral crepitation but good range of motion and his ligaments stable. Specialty Comments:  No specialty comments available.  Imaging: Xr Knee 1-2 Views Right  Result Date: 10/31/2016 An AP and lateral of the right knee shows significant tricompartmental  arthritis mainly involving the lateral compartment of her knee. There is only a slight valgus malalignment. There is no effusion.  Xr Pelvis 1-2 Views  Result Date: 10/31/2016 An AP pelvis shows bilateral hip replacements in good position that are well seated. There is no, getting features. There is only slight heterotopic ossification more left than the right.    PMFS History: Patient Active Problem List   Diagnosis Date Noted  . 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 Degenerative arthritis of hip 04/19/2012  . Anxiety   . Cancer ([place])   . Depression   . Arthritis   . RENAL CALCULUS, RIGHT 12/15/2008  . ABDOMINAL PAIN, UNSPECIFIED SITE 12/15/2008  . NEPHROLITHIASIS, HX OF 12/15/2008  . ANXIETY DEPRESSION 10/29/2007  . CONTACT DERMATITIS DUE TO SOLAR RADIATION 10/29/2007  . OSTEOARTHROSIS, LOCAL, PRIMARY, UNSPC SITE 08/16/2007   Past Medical History:  Diagnosis Date  . Anxiety   . Arthritis    lower back, hip  . Back pain    generally "causes severe stiffness"  . Complication of anesthesia    "felt it made her feel very goofy for several months"  . Depression   . History of kidney stones    x1 episode  . Osteopenia   . Skin cancer 1990  squamous/basil cell    Family History  Problem Relation Age of Onset  . Cancer Father     prostate  . Hypertension Father   . Heart attack Father   . Cancer Brother     Occupational psychologist  . Cancer Maternal Grandmother     throat  . Breast cancer Paternal Grandmother     Past Surgical History:  Procedure Laterality Date  . APPENDECTOMY  2010   open  . BREAST LUMPECTOMY  1961  . TOTAL HIP ARTHROPLASTY  04/19/2012   Procedure: TOTAL HIP ARTHROPLASTY ANTERIOR APPROACH;  Surgeon: Mcarthur Rossetti, MD;  Location: [facility];  Service: Orthopedics;  Laterality: Left;  Left Total Hip Arthroplasty  . TOTAL HIP ARTHROPLASTY Right 04/21/2016   Procedure: RIGHT TOTAL HIP  ARTHROPLASTY ANTERIOR APPROACH;  Surgeon: Mcarthur Rossetti, MD;  Location: [facility];  Service: Orthopedics;  Laterality: Right;   Social History   Occupational History  . Not on file.   Social History Main Topics  . Smoking status: Former Smoker    Quit date: 04/15/1986  . Smokeless tobacco: Never Used  . Alcohol use 3.5 oz/week    7 Standard drinks or equivalent per week     Comment: 1 drink daily  . Drug use: No  . Sexual activity: No

## 2016-11-09 DIAGNOSIS — L821 Other seborrheic keratosis: Secondary | ICD-10-CM | POA: Diagnosis not present

## 2016-11-09 DIAGNOSIS — L570 Actinic keratosis: Secondary | ICD-10-CM | POA: Diagnosis not present

## 2016-11-09 DIAGNOSIS — Z85828 Personal history of other malignant neoplasm of skin: Secondary | ICD-10-CM | POA: Diagnosis not present

## 2016-11-09 DIAGNOSIS — Z23 Encounter for immunization: Secondary | ICD-10-CM | POA: Diagnosis not present

## 2016-11-09 DIAGNOSIS — D1801 Hemangioma of skin and subcutaneous tissue: Secondary | ICD-10-CM | POA: Diagnosis not present

## 2016-11-09 DIAGNOSIS — L814 Other melanin hyperpigmentation: Secondary | ICD-10-CM | POA: Diagnosis not present

## 2016-12-06 ENCOUNTER — Ambulatory Visit (INDEPENDENT_AMBULATORY_CARE_PROVIDER_SITE_OTHER): Payer: PPO | Admitting: Orthopaedic Surgery

## 2016-12-06 DIAGNOSIS — G8929 Other chronic pain: Secondary | ICD-10-CM | POA: Diagnosis not present

## 2016-12-06 DIAGNOSIS — M1711 Unilateral primary osteoarthritis, right knee: Secondary | ICD-10-CM | POA: Insufficient documentation

## 2016-12-06 DIAGNOSIS — M25561 Pain in right knee: Secondary | ICD-10-CM

## 2016-12-06 MED ORDER — HYALURONAN 88 MG/4ML IX SOSY
88.0000 mg | PREFILLED_SYRINGE | INTRA_ARTICULAR | Status: AC | PRN
  Administered 2016-12-06: 88 mg via INTRA_ARTICULAR

## 2016-12-06 NOTE — Progress Notes (Signed)
Office Visit Note   Patient: [name]           Date of Birth: [birth year]           MRN: [id number] Visit Date: 12/06/2016              Requested by: Lajean Manes, MD [address] [place], [place] [address] PCP: Mathews Argyle, MD   Assessment & Plan: Visit Diagnoses: No diagnosis found.  Plan: We'll see her back in 3 months to see how she is doing overall but no x-rays are needed. He can always place a steroid injection in her knee to get her through her May trip if needed.  Follow-Up Instructions: Return in about 3 months (around 03/05/2017).   Orders:  Orders Placed This Encounter  Procedures  . Large Joint Injection/Arthrocentesis   No orders of the defined types were placed in this encounter.     Procedures: Large Joint Inj Date/Time: 12/06/2016 9:48 AM Performed by: Anise Salvo Authorized by: Mcarthur Rossetti   Consent Given by:  Patient Indications:  Pain Location:  Knee Site:  R knee Ultrasound Guidance: No   Fluoroscopic Guidance: No   Medications:  88 mg Hyaluronan 88 MG/4ML     Clinical Data: No additional findings.   Subjective: Chief Complaint  Patient presents with  . Right Hip - Follow-up    HPI The patient is actually here today for scheduled hyaluronic acid injection in her right knee. This has already been preauthorized approved. Review of Systems   Objective: Vital Signs: LMP 10/31/1991   Physical Exam  Ortho Exam Her right knee shows a mild effusion with good range of motion and patellofemoral crepitation. The valgus deformity. Specialty Comments:  No specialty comments available.  Imaging: No results found.   PMFS History: Patient Active Problem List   Diagnosis Date Noted  . 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 Degenerative arthritis of hip 04/19/2012  . Anxiety   . Cancer ([place])   . Depression   . Arthritis   . RENAL  CALCULUS, RIGHT 12/15/2008  . ABDOMINAL PAIN, UNSPECIFIED SITE 12/15/2008  . NEPHROLITHIASIS, HX OF 12/15/2008  . ANXIETY DEPRESSION 10/29/2007  . CONTACT DERMATITIS DUE TO SOLAR RADIATION 10/29/2007  . OSTEOARTHROSIS, LOCAL, PRIMARY, UNSPC SITE 08/16/2007   Past Medical History:  Diagnosis Date  . Anxiety   . Arthritis    lower back, hip  . Back pain    generally "causes severe stiffness"  . Complication of anesthesia    "felt it made her feel very goofy for several months"  . Depression   . History of kidney stones    x1 episode  . Osteopenia   . 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 Cancer Father     prostate  . Hypertension Father   . Heart attack Father   . Cancer Brother     Occupational psychologist  . Cancer Maternal Grandmother     throat  . Breast cancer Paternal Grandmother     Past Surgical History:  Procedure Laterality Date  . APPENDECTOMY  2010   open  . BREAST LUMPECTOMY  1961  . TOTAL HIP ARTHROPLASTY  04/19/2012   Procedure: TOTAL HIP ARTHROPLASTY ANTERIOR APPROACH;  Surgeon: Mcarthur Rossetti, MD;  Location: [facility];  Service: Orthopedics;  Laterality: Left;  Left Total Hip Arthroplasty  .  TOTAL HIP ARTHROPLASTY Right 04/21/2016   Procedure: RIGHT TOTAL HIP ARTHROPLASTY ANTERIOR APPROACH;  Surgeon: Mcarthur Rossetti, MD;  Location: [facility];  Service: Orthopedics;  Laterality: Right;   Social History   Occupational History  . Not on file.   Social History Main Topics  . Smoking status: Former Smoker    Quit date: 04/15/1986  . Smokeless tobacco: Never Used  . Alcohol use 3.5 oz/week    7 Standard drinks or equivalent per week     Comment: 1 drink daily  . Drug use: No  . Sexual activity: No

## 2016-12-22 DIAGNOSIS — L309 Dermatitis, unspecified: Secondary | ICD-10-CM | POA: Diagnosis not present

## 2016-12-22 DIAGNOSIS — L570 Actinic keratosis: Secondary | ICD-10-CM | POA: Diagnosis not present

## 2016-12-22 DIAGNOSIS — C44622 Squamous cell carcinoma of skin of right upper limb, including shoulder: Secondary | ICD-10-CM | POA: Diagnosis not present

## 2016-12-22 DIAGNOSIS — D485 Neoplasm of uncertain behavior of skin: Secondary | ICD-10-CM | POA: Diagnosis not present

## 2017-01-17 DIAGNOSIS — L810 Postinflammatory hyperpigmentation: Secondary | ICD-10-CM | POA: Diagnosis not present

## 2017-01-17 DIAGNOSIS — L821 Other seborrheic keratosis: Secondary | ICD-10-CM | POA: Diagnosis not present

## 2017-01-17 DIAGNOSIS — L309 Dermatitis, unspecified: Secondary | ICD-10-CM | POA: Diagnosis not present

## 2017-01-17 DIAGNOSIS — Z85828 Personal history of other malignant neoplasm of skin: Secondary | ICD-10-CM | POA: Diagnosis not present

## 2017-01-31 DIAGNOSIS — C44622 Squamous cell carcinoma of skin of right upper limb, including shoulder: Secondary | ICD-10-CM | POA: Diagnosis not present

## 2017-02-20 DIAGNOSIS — M810 Age-related osteoporosis without current pathological fracture: Secondary | ICD-10-CM | POA: Diagnosis not present

## 2017-02-20 DIAGNOSIS — H900 Conductive hearing loss, bilateral: Secondary | ICD-10-CM | POA: Diagnosis not present

## 2017-02-20 DIAGNOSIS — M8580 Other specified disorders of bone density and structure, unspecified site: Secondary | ICD-10-CM | POA: Diagnosis not present

## 2017-02-20 DIAGNOSIS — Z0000 Encounter for general adult medical examination without abnormal findings: Secondary | ICD-10-CM | POA: Diagnosis not present

## 2017-02-20 DIAGNOSIS — Z79899 Other long term (current) drug therapy: Secondary | ICD-10-CM | POA: Diagnosis not present

## 2017-02-20 DIAGNOSIS — E7800 Pure hypercholesterolemia, unspecified: Secondary | ICD-10-CM | POA: Diagnosis not present

## 2017-02-20 DIAGNOSIS — F325 Major depressive disorder, single episode, in full remission: Secondary | ICD-10-CM | POA: Diagnosis not present

## 2017-03-06 ENCOUNTER — Ambulatory Visit (INDEPENDENT_AMBULATORY_CARE_PROVIDER_SITE_OTHER): Payer: PPO | Admitting: Physician Assistant

## 2017-03-06 DIAGNOSIS — M545 Low back pain, unspecified: Secondary | ICD-10-CM

## 2017-03-06 DIAGNOSIS — M1711 Unilateral primary osteoarthritis, right knee: Secondary | ICD-10-CM

## 2017-03-06 DIAGNOSIS — G8929 Other chronic pain: Secondary | ICD-10-CM | POA: Diagnosis not present

## 2017-03-06 DIAGNOSIS — M2170 Unequal limb length (acquired), unspecified site: Secondary | ICD-10-CM

## 2017-03-06 DIAGNOSIS — H5213 Myopia, bilateral: Secondary | ICD-10-CM | POA: Diagnosis not present

## 2017-03-06 DIAGNOSIS — H2513 Age-related nuclear cataract, bilateral: Secondary | ICD-10-CM | POA: Diagnosis not present

## 2017-03-06 MED ORDER — METHYLPREDNISOLONE ACETATE 40 MG/ML IJ SUSP
40.0000 mg | INTRAMUSCULAR | Status: AC | PRN
  Administered 2017-03-06: 40 mg via INTRA_ARTICULAR

## 2017-03-06 MED ORDER — LIDOCAINE HCL 1 % IJ SOLN
3.0000 mL | INTRAMUSCULAR | Status: AC | PRN
  Administered 2017-03-06: 3 mL

## 2017-03-06 NOTE — Progress Notes (Signed)
Office Visit Note   Patient: [name]           Date of Birth: 1941-03-03           MRN: [id number] Visit Date: 03/06/2017              Requested by: Lajean Manes, MD [address] [place], [place] [address] PCP: Lajean Manes, MD   Assessment & Plan: Visit Diagnoses:  1. Unilateral primary osteoarthritis, right knee   2. Chronic low back pain without sciatica, unspecified back pain laterality   3. Leg length discrepancy     Plan: We'll see her back in August before she goes to [place] for a monophasic injection right knee. She's given a prescription to go to physical therapy Ralie[facility] Ip for core strengthening, home exercise program, IT band stretching/hamstring stretching and gait balance. Prescription given for [employer] for a lift for her left shoes.  Follow-Up Instructions: Return in about 3 months (around 06/06/2017) for Supplemental injection.   Orders:  Orders Placed This Encounter  Procedures  . Large Joint Injection/Arthrocentesis   No orders of the defined types were placed in this encounter.     Procedures: Large Joint Inj Date/Time: 03/06/2017 9:50 AM Performed by: Pete Pelt Authorized by: Pete Pelt   Consent Given by:  Patient Indications:  Pain Location:  Knee Site:  R knee Needle Size:  25 G Approach:  Anterolateral Ultrasound Guidance: No   Fluoroscopic Guidance: No   Medications:  40 mg methylPREDNISolone acetate 40 MG/ML; 3 mL lidocaine 1 % Aspiration Attempted: No   Patient tolerance:  Patient tolerated the procedure well with no immediate complications     Clinical Data: No additional findings.   Subjective: Right knee pain Low back pain Leg length discrepancy  HPI Associate comes in today stating that her right knee did well initially from the modular this injection was given in February but now starting to wear off. She's having pain mainly lateral aspect of her right knee. She has known tricompartmental  arthritis of the right knee. She is also having some low back pain without radicular symptoms she seen Dr. neck in the past for this. She states when it some exercises for her back. She notes she's also has decreased strength when getting up from a seated position. Leg length discrepancy status post bilateral hip replacements done years apart. Her right leg is slightly longer than the left she is wearing a lift needs Lifts in her shoes and is asking for a prescription for this. Review of Systems No radicular symptoms down either leg. No fevers chills shortness breath calf pain.  Objective: Vital Signs: LMP 10/31/1991   Physical Exam  Constitutional: She is oriented to person, place, and time. She appears well-developed and well-nourished.  Pulmonary/Chest: Effort normal.  Neurological: She is alert and oriented to person, place, and time.  Psychiatric: She has a normal mood and affect. Her behavior is normal.    Ortho Exam Right knee full extension flexion is full. She has obvious valgus deformity. No effusion abnormal warmth or edema. Patellofemoral crepitus is present with passive range of motion. Specialty Comments:  No specialty comments available.  Imaging: No results found.   PMFS History: Patient Active Problem List   Diagnosis Date Noted  . Leg length discrepancy 03/06/2017  . Chronic low back pain without sciatica 03/06/2017  . Unilateral primary osteoarthritis, right knee 12/06/2016  . 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 Status post total  replacement of right hip 04/21/2016  . Degenerative arthritis of hip 04/19/2012  . Anxiety   . Cancer ([place])   . Depression   . Arthritis   . RENAL CALCULUS, RIGHT 12/15/2008  . ABDOMINAL PAIN, UNSPECIFIED SITE 12/15/2008  . NEPHROLITHIASIS, HX OF 12/15/2008  . ANXIETY DEPRESSION 10/29/2007  . CONTACT DERMATITIS DUE TO SOLAR RADIATION 10/29/2007  . OSTEOARTHROSIS, LOCAL, PRIMARY, UNSPC SITE 08/16/2007   Past Medical  History:  Diagnosis Date  . Anxiety   . Arthritis    lower back, hip  . Back pain    generally "causes severe stiffness"  . Complication of anesthesia    "felt it made her feel very goofy for several months"  . Depression   . History of kidney stones    x1 episode  . Osteopenia   . 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 Cancer Father     prostate  . Hypertension Father   . Heart attack Father   . Cancer Brother     Occupational psychologist  . Cancer Maternal Grandmother     throat  . Breast cancer Paternal Grandmother     Past Surgical History:  Procedure Laterality Date  . APPENDECTOMY  2010   open  . BREAST LUMPECTOMY  1961  . TOTAL HIP ARTHROPLASTY  04/19/2012   Procedure: TOTAL HIP ARTHROPLASTY ANTERIOR APPROACH;  Surgeon: Mcarthur Rossetti, MD;  Location: [facility];  Service: Orthopedics;  Laterality: Left;  Left Total Hip Arthroplasty  . TOTAL HIP ARTHROPLASTY Right 04/21/2016   Procedure: RIGHT TOTAL HIP ARTHROPLASTY ANTERIOR APPROACH;  Surgeon: Mcarthur Rossetti, MD;  Location: [facility];  Service: Orthopedics;  Laterality: Right;   Social History   Occupational History  . Not on file.   Social History Main Topics  . Smoking status: Former Smoker    Quit date: 04/15/1986  . Smokeless tobacco: Never Used  . Alcohol use 3.5 oz/week    7 Standard drinks or equivalent per week     Comment: 1 drink daily  . Drug use: No  . Sexual activity: No

## 2017-04-11 DIAGNOSIS — W57XXXA Bitten or stung by nonvenomous insect and other nonvenomous arthropods, initial encounter: Secondary | ICD-10-CM | POA: Diagnosis not present

## 2017-04-11 DIAGNOSIS — S30861A Insect bite (nonvenomous) of abdominal wall, initial encounter: Secondary | ICD-10-CM | POA: Diagnosis not present

## 2017-04-16 DIAGNOSIS — Z85828 Personal history of other malignant neoplasm of skin: Secondary | ICD-10-CM | POA: Diagnosis not present

## 2017-04-16 DIAGNOSIS — L570 Actinic keratosis: Secondary | ICD-10-CM | POA: Diagnosis not present

## 2017-04-20 DIAGNOSIS — W57XXXA Bitten or stung by nonvenomous insect and other nonvenomous arthropods, initial encounter: Secondary | ICD-10-CM | POA: Diagnosis not present

## 2017-04-20 DIAGNOSIS — R21 Rash and other nonspecific skin eruption: Secondary | ICD-10-CM | POA: Diagnosis not present

## 2017-05-09 ENCOUNTER — Ambulatory Visit: Payer: PPO | Admitting: Audiology

## 2017-05-21 ENCOUNTER — Ambulatory Visit (INDEPENDENT_AMBULATORY_CARE_PROVIDER_SITE_OTHER): Payer: PPO | Admitting: Orthopaedic Surgery

## 2017-05-21 DIAGNOSIS — M1711 Unilateral primary osteoarthritis, right knee: Secondary | ICD-10-CM | POA: Diagnosis not present

## 2017-05-21 MED ORDER — HYALURONAN 88 MG/4ML IX SOSY
88.0000 mg | PREFILLED_SYRINGE | INTRA_ARTICULAR | Status: AC | PRN
Start: 2017-05-21 — End: 2017-05-21
  Administered 2017-05-21: 88 mg via INTRA_ARTICULAR

## 2017-05-21 NOTE — Progress Notes (Signed)
   Procedure Note  Patient: [name]             Date of Birth: 07-Jan-1941           MRN: [id number]             Visit Date: 05/21/2017  Procedures: Visit Diagnoses: Unilateral primary osteoarthritis, right knee  Large Joint Inj Date/Time: 05/21/2017 9:03 AM Performed by: Mcarthur Rossetti Authorized by: Mcarthur Rossetti   Location:  Knee Site:  R knee Ultrasound Guidance: No   Fluoroscopic Guidance: No   Arthrogram: No   Medications:  88 mg Hyaluronan 88 MG/4ML    The patient is here for scheduled hyaluronic acid injection in her right knee. She understands fully the risks and benefits of these injections. She leaves for [place] August 6. These injections have helped greatly in the past.  She tolerated the injection well. She no she can always get a steroid injection in this knee again in 2-3 months if needed. She'll follow up as needed otherwise. All questions were encouraged and answered.

## 2017-05-30 ENCOUNTER — Ambulatory Visit (INDEPENDENT_AMBULATORY_CARE_PROVIDER_SITE_OTHER): Payer: PPO | Admitting: Orthopaedic Surgery

## 2017-06-21 DIAGNOSIS — D1801 Hemangioma of skin and subcutaneous tissue: Secondary | ICD-10-CM | POA: Diagnosis not present

## 2017-06-21 DIAGNOSIS — L821 Other seborrheic keratosis: Secondary | ICD-10-CM | POA: Diagnosis not present

## 2017-06-21 DIAGNOSIS — L570 Actinic keratosis: Secondary | ICD-10-CM | POA: Diagnosis not present

## 2017-06-21 DIAGNOSIS — Z85828 Personal history of other malignant neoplasm of skin: Secondary | ICD-10-CM | POA: Diagnosis not present

## 2017-06-21 DIAGNOSIS — D225 Melanocytic nevi of trunk: Secondary | ICD-10-CM | POA: Diagnosis not present

## 2017-06-21 DIAGNOSIS — L814 Other melanin hyperpigmentation: Secondary | ICD-10-CM | POA: Diagnosis not present

## 2017-08-24 DIAGNOSIS — F321 Major depressive disorder, single episode, moderate: Secondary | ICD-10-CM | POA: Diagnosis not present

## 2017-08-24 DIAGNOSIS — R030 Elevated blood-pressure reading, without diagnosis of hypertension: Secondary | ICD-10-CM | POA: Diagnosis not present

## 2017-08-24 DIAGNOSIS — Z23 Encounter for immunization: Secondary | ICD-10-CM | POA: Diagnosis not present

## 2017-09-04 DIAGNOSIS — Z23 Encounter for immunization: Secondary | ICD-10-CM | POA: Diagnosis not present

## 2017-09-04 DIAGNOSIS — L570 Actinic keratosis: Secondary | ICD-10-CM | POA: Diagnosis not present

## 2017-09-04 DIAGNOSIS — D485 Neoplasm of uncertain behavior of skin: Secondary | ICD-10-CM | POA: Diagnosis not present

## 2017-09-05 ENCOUNTER — Ambulatory Visit: Payer: PPO | Admitting: Podiatry

## 2017-09-05 ENCOUNTER — Ambulatory Visit (INDEPENDENT_AMBULATORY_CARE_PROVIDER_SITE_OTHER): Payer: PPO | Admitting: Orthopaedic Surgery

## 2017-09-06 ENCOUNTER — Ambulatory Visit (INDEPENDENT_AMBULATORY_CARE_PROVIDER_SITE_OTHER): Payer: PPO | Admitting: Orthopaedic Surgery

## 2017-09-06 ENCOUNTER — Ambulatory Visit: Payer: PPO | Admitting: Podiatry

## 2017-09-06 ENCOUNTER — Ambulatory Visit (INDEPENDENT_AMBULATORY_CARE_PROVIDER_SITE_OTHER): Payer: PPO

## 2017-09-06 ENCOUNTER — Encounter: Payer: Self-pay | Admitting: Podiatry

## 2017-09-06 DIAGNOSIS — L84 Corns and callosities: Secondary | ICD-10-CM

## 2017-09-06 DIAGNOSIS — M7752 Other enthesopathy of left foot: Secondary | ICD-10-CM | POA: Diagnosis not present

## 2017-09-06 DIAGNOSIS — M1711 Unilateral primary osteoarthritis, right knee: Secondary | ICD-10-CM | POA: Diagnosis not present

## 2017-09-06 DIAGNOSIS — M2042 Other hammer toe(s) (acquired), left foot: Secondary | ICD-10-CM

## 2017-09-06 DIAGNOSIS — M25562 Pain in left knee: Secondary | ICD-10-CM | POA: Diagnosis not present

## 2017-09-06 DIAGNOSIS — M779 Enthesopathy, unspecified: Secondary | ICD-10-CM

## 2017-09-06 DIAGNOSIS — M205X1 Other deformities of toe(s) (acquired), right foot: Secondary | ICD-10-CM

## 2017-09-06 DIAGNOSIS — G8929 Other chronic pain: Secondary | ICD-10-CM | POA: Diagnosis not present

## 2017-09-06 DIAGNOSIS — M25561 Pain in right knee: Secondary | ICD-10-CM

## 2017-09-06 DIAGNOSIS — M1712 Unilateral primary osteoarthritis, left knee: Secondary | ICD-10-CM

## 2017-09-06 MED ORDER — LIDOCAINE HCL 1 % IJ SOLN
3.0000 mL | INTRAMUSCULAR | Status: AC | PRN
  Administered 2017-09-06: 3 mL

## 2017-09-06 MED ORDER — METHYLPREDNISOLONE ACETATE 40 MG/ML IJ SUSP
40.0000 mg | INTRAMUSCULAR | Status: AC | PRN
  Administered 2017-09-06: 40 mg via INTRA_ARTICULAR

## 2017-09-06 MED ORDER — TRIAMCINOLONE ACETONIDE 10 MG/ML IJ SUSP
10.0000 mg | Freq: Once | INTRAMUSCULAR | Status: AC
  Administered 2017-09-06: 10 mg

## 2017-09-06 NOTE — Progress Notes (Signed)
Office Visit Note   Patient: [name]           Date of Birth: 01/06/1941           MRN: [id number] Visit Date: 09/06/2017              Requested by: Lajean Manes, MD [address] [place], [place] [address] PCP: Lajean Manes, MD   Assessment & Plan: Visit Diagnoses:  1. Chronic pain of left knee   2. Unilateral primary osteoarthritis, left knee   3. Unilateral primary osteoarthritis, right knee   4. Chronic pain of right knee     Plan: Per her request I was fine with placing steroid injections in her knees.  She tolerated these injections well.  She understands that if he gets to be February or March of next year she can always call to have hyaluronic acid injections again.  Follow-Up Instructions: Return if symptoms worsen or fail to improve.   Orders:  Orders Placed This Encounter  Procedures  . Large Joint Inj: L knee  . Large Joint Inj   No orders of the defined types were placed in this encounter.     Procedures: Large Joint Inj: L knee on 09/06/2017 4:50 PM Indications: diagnostic evaluation and pain Details: 22 G 1.5 in needle, superolateral approach  Arthrogram: No  Medications: 3 mL lidocaine 1 %; 40 mg methylPREDNISolone acetate 40 MG/ML Outcome: tolerated well, no immediate complications Procedure, treatment alternatives, risks and benefits explained, specific risks discussed. Consent was given by the patient. Immediately prior to procedure a time out was called to verify the correct patient, procedure, equipment, support staff and site/side marked as required. Patient was prepped and draped in the usual sterile fashion.   Large Joint Inj: R knee on 09/06/2017 4:59 PM Indications: diagnostic evaluation and pain Details: 22 G 1.5 in needle, superolateral approach  Arthrogram: No  Medications: 3 mL lidocaine 1 %; 40 mg methylPREDNISolone acetate 40 MG/ML Outcome: tolerated well, no immediate complications Procedure, treatment  alternatives, risks and benefits explained, specific risks discussed. Consent was given by the patient. Immediately prior to procedure a time out was called to verify the correct patient, procedure, equipment, support staff and site/side marked as required. Patient was prepped and draped in the usual sterile fashion.       Clinical Data: No additional findings.   Subjective: Chief Complaint  Patient presents with  . Left Knee - Pain  . Right Knee - Pain  The patient is well-known to [place].  She is a 76 year old active female with bilateral knee pain and arthritis.  This is been well documented as well with x-rays and clinical exam.  She has had hyaluronic acid injections in both knees in the past and this was was [facility] May 21, 2017.  She comes in today requesting bilateral knee steroid injections.  She is not a diabetic.  She has had no other change in her medical status and no active medical issues.  HPI  Review of Systems She denies any headache, chest pain, shortness of breath, fever, chills, nausea, vomiting  Objective: Vital Signs: LMP 10/31/1991   Physical Exam She is alert and oriented x3 in no acute distress Ortho Exam Examination of both knees shows slight malalignment but otherwise no effusion with patellofemoral crepitation and global tenderness the medial lateral joint lines of both knees.  Both knees are ligamentously stable. Specialty Comments:  No specialty comments available.  Imaging: Dg Foot 2 Views Left  Result Date: 09/06/2017 Please see detailed radiograph report in office note.    PMFS History: Patient Active Problem List   Diagnosis Date Noted  . Chronic pain of left knee 09/06/2017  . Unilateral primary osteoarthritis, left knee 09/06/2017  . Chronic pain of right knee 09/06/2017  . Leg length discrepancy 03/06/2017  . Chronic low back pain without sciatica 03/06/2017  . Unilateral primary osteoarthritis, right knee 12/06/2016  . Osteoarthritis  of right hip 04/21/2016  . 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 Degenerative arthritis of hip 04/19/2012  . Anxiety   . Cancer ([place])   . Depression   . Arthritis   . RENAL CALCULUS, RIGHT 12/15/2008  . ABDOMINAL PAIN, UNSPECIFIED SITE 12/15/2008  . NEPHROLITHIASIS, HX OF 12/15/2008  . ANXIETY DEPRESSION 10/29/2007  . CONTACT DERMATITIS DUE TO SOLAR RADIATION 10/29/2007  . OSTEOARTHROSIS, LOCAL, PRIMARY, UNSPC SITE 08/16/2007   Past Medical History:  Diagnosis Date  . Anxiety   . Arthritis    lower back, hip  . Back pain    generally "causes severe stiffness"  . Complication of anesthesia    "felt it made her feel very goofy for several months"  . Depression   . History of kidney stones    x1 episode  . Osteopenia   . 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 Cancer Father        prostate  . Hypertension Father   . Heart attack Father   . Cancer Brother        Occupational psychologist  . Cancer Maternal Grandmother        throat  . Breast cancer Paternal Grandmother     Past Surgical History:  Procedure Laterality Date  . APPENDECTOMY  2010   open  . BREAST LUMPECTOMY  1961   Social History   Occupational History  . Not on file  Tobacco Use  . Smoking status: Former Smoker    Last attempt to quit: 04/15/1986    Years since quitting: 31.4  . Smokeless tobacco: Never Used  Substance and Sexual Activity  . Alcohol use: Yes    Alcohol/week: 3.5 oz    Types: 7 Standard drinks or equivalent per week    Comment: 1 drink daily  . Drug use: No  . Sexual activity: No

## 2017-09-06 NOTE — Progress Notes (Signed)
Subjective:    Patient ID: [name], female   DOB: 76 y.o.   MRN: [id number]   HPI patient presents stating she's developed very painful calluses on the left second and fourth toes and states it's making it hard to be comfortable. States she's tried wider shoes padding and Dr. Renaldo Reel without relief    ROS      Objective:  Physical Exam neurovascular status intact negative Homans sign was noted with patient found to have inflammatory changes inner side fourth digit left and second digit with compression of the toes and structural malalignment. 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

## 2017-09-26 ENCOUNTER — Other Ambulatory Visit: Payer: Self-pay | Admitting: Geriatric Medicine

## 2017-09-26 DIAGNOSIS — Z6825 Body mass index (BMI) 25.0-25.9, adult: Secondary | ICD-10-CM | POA: Diagnosis not present

## 2017-09-26 DIAGNOSIS — I10 Essential (primary) hypertension: Secondary | ICD-10-CM | POA: Diagnosis not present

## 2017-09-26 DIAGNOSIS — E663 Overweight: Secondary | ICD-10-CM | POA: Diagnosis not present

## 2017-09-26 DIAGNOSIS — Z1231 Encounter for screening mammogram for malignant neoplasm of breast: Secondary | ICD-10-CM

## 2017-10-24 ENCOUNTER — Ambulatory Visit
Admission: RE | Admit: 2017-10-24 | Discharge: 2017-10-24 | Disposition: A | Discharge status: Home / Self Care | Payer: PPO | Source: Ambulatory Visit | Attending: Geriatric Medicine | Admitting: Geriatric Medicine

## 2017-10-24 DIAGNOSIS — Z1231 Encounter for screening mammogram for malignant neoplasm of breast: Secondary | ICD-10-CM | POA: Diagnosis not present

## 2017-11-02 ENCOUNTER — Ambulatory Visit: Payer: PPO | Admitting: Sports Medicine

## 2017-11-05 ENCOUNTER — Encounter: Payer: Self-pay | Admitting: Sports Medicine

## 2017-11-05 ENCOUNTER — Ambulatory Visit (INDEPENDENT_AMBULATORY_CARE_PROVIDER_SITE_OTHER): Payer: Medicare HMO

## 2017-11-05 ENCOUNTER — Ambulatory Visit (INDEPENDENT_AMBULATORY_CARE_PROVIDER_SITE_OTHER): Payer: Medicare HMO | Admitting: Sports Medicine

## 2017-11-05 VITALS — BP 122/82 | HR 66 | Ht 66.0 in | Wt 158.6 lb

## 2017-11-05 DIAGNOSIS — M1711 Unilateral primary osteoarthritis, right knee: Secondary | ICD-10-CM | POA: Diagnosis not present

## 2017-11-05 DIAGNOSIS — M2170 Unequal limb length (acquired), unspecified site: Secondary | ICD-10-CM

## 2017-11-05 DIAGNOSIS — M1712 Unilateral primary osteoarthritis, left knee: Secondary | ICD-10-CM | POA: Diagnosis not present

## 2017-11-05 DIAGNOSIS — M48061 Spinal stenosis, lumbar region without neurogenic claudication: Secondary | ICD-10-CM | POA: Diagnosis not present

## 2017-11-05 DIAGNOSIS — M545 Low back pain: Secondary | ICD-10-CM | POA: Diagnosis not present

## 2017-11-05 DIAGNOSIS — Z96641 Presence of right artificial hip joint: Secondary | ICD-10-CM

## 2017-11-05 DIAGNOSIS — R269 Unspecified abnormalities of gait and mobility: Secondary | ICD-10-CM

## 2017-11-05 DIAGNOSIS — M5136 Other intervertebral disc degeneration, lumbar region: Secondary | ICD-10-CM | POA: Diagnosis not present

## 2017-11-05 MED ORDER — CELECOXIB 200 MG PO CAPS: 200.0000 mg | ORAL_CAPSULE | Freq: Every day | ORAL | 0 refills | Status: DC

## 2017-11-05 MED ORDER — CELECOXIB 200 MG PO CAPS: 200.0000 mg | ORAL_CAPSULE | Freq: Every day | ORAL | 2 refills | Status: DC

## 2017-11-05 NOTE — Patient Instructions (Signed)

## 2017-11-05 NOTE — Assessment & Plan Note (Signed)
If the back pain does seem to be facet mediated in nature.  She should continue with therapeutic exercises as previously doing in addition to core stabilization exercises provided today.  If any lack of improvement can consider directed injection versus RFA

## 2017-11-05 NOTE — Progress Notes (Signed)
[name]. [name], [name] at [facility] [phone]  [name] - 77 y.o. female MRN [id number]  Date of birth: 1940-12-24  Visit Date: 11/05/2017  PCP: Joy Manes, MD   Referred by: Joy Manes, MD   Scribe for today's visit: [name], CMA     SUBJECTIVE:  [name] is here for New Patient (Initial Visit) (osteoarthritis of both knees) .  Referred by: Dr. Ninfa Chandler Her abnormal gait symptoms INITIALLY: Began June 2017 and started after having RT hip replacement.  Described as moderate aching and stiffness Worsened with carrying heavy loads, sit-to-stand, weather changes. She noticed a lot of pain in her RT knee when going from sitting to standing. She has not done formal PT but she does an aqua arthritis class at the [facility].  Improved with rest Additional associated symptoms include: Pt reports that her RT leg is longer then her LT leg since having her RT hip replacement. She has tried putting lifts her in LT shoe which seems to be irritating her lower. She also has concerns about her balance d/t the stiffness in her hip and knees.     At this time symptoms are worsening compared to onset She has been taking Aleve and Glucosamine with some relief. She has lift in LT shoe.   Xray RT knee 10/31/16.    ROS Reports night time disturbances. Denies fevers, chills, or night sweats. Denies unexplained weight loss. Reports personal history of cancer. Denies changes in bowel or bladder habits. Denies recent unreported falls. Denies new or worsening dyspnea or wheezing. Reports headaches or dizziness.  Denies numbness, tingling or weakness  In the extremities.  Denies dizziness or presyncopal episodes Denies lower extremity edema     HISTORY & PERTINENT PRIOR DATA:  Prior History reviewed and updated per electronic medical record.  Significant history, findings, studies and interim changes include:  reports that she quit  smoking about 31 years ago. she has never used smokeless tobacco. No results for input(s): HGBA1C, LABURIC, CREATINE in the last 8760 hours. No specialty comments available. Problem  Leg Length Difference, Acquired   True, approximate 1 cm short left leg. Has an appropriate shoe buildup   Low Back Pain  Osteoarthritis of Right Hip (Resolved)     OBJECTIVE:  VS:  HT:5\' 6"  (167.6 cm)   WT:158 lb 9.6 oz (71.9 kg)  BMI:25.61    BP:122/82  HR:66bpm  TEMP: ( )  RESP:98 %   PHYSICAL EXAM: Constitutional: WDWN, Non-toxic appearing. Psychiatric: Alert & appropriately interactive. Not depressed or anxious appearing. Respiratory: No increased work of breathing. Trachea Midline Eyes: Pupils are equal. EOM intact without nystagmus. No scleral icterus Cardiovascular:  Peripheral Pulses: peripheral pulses symmetrical No clubbing or cyanosis appreciated Capillary Refill is normal, less than 2 seconds No signficant generalized edema/anasarca Sensory Exam: intact to light touch  Slightly antalgic, wide based gait.  She has limited internal and external rotation of the left hip.  With overall well-maintained IR/ER of the right hip.  Negative straight leg raise.  Pain with palpation of the lumbar facets bilaterally with marked paraspinal muscle spasms.  Some pain over the greater trochanter of the left more than right hip.  Left gluteal pain is mild.  Lower extremity sensation is intact light touch.  She has a slight genu valgus at rest.  Full flexion extension the knee.  Bilateral knees do not have any effusion.  She is ligamentously stable with anterior posterior drawer and  varus and valgus testing bilateral knees but does have 3-4 mm of opening of the right knee.  Some pain with patellar grind.  Left leg length is appreciated to be approximately 1 cm shorter than the right at both the knee and the medial malleolus.  Bilateral ASIS compression tests are open and nontender.  Leg length difference  remains the same with seated and laying positions  ASSESSMENT & PLAN:   1. Status post total replacement of right hip   2. Unilateral primary osteoarthritis, left knee   3. Unilateral primary osteoarthritis, right knee   4. Low back pain, unspecified back pain laterality, unspecified chronicity, with sciatica presence unspecified   5. Gait disturbance   6. Leg length difference, acquired    PLAN: Her heel lift appears to be approximately the right size and agree that she should continue with this at this time.  Would like for her to begin on Celebrex once daily for the next week and then intermittent bursting as needed.  Therapeutic exercises with Archie Balboa exercises provided today and she should continue with the water aerobics she is Artie doing.  We could consider radiofrequency ablation versus facet injections if she is continued to have focal back pain.  For her knee she does seem to have a moderate degree of lateral compartment wear and she will likely end up needing a total knee arthroplasty at some point in the future she would like to try to avoid this as long as possible.  We will plan to check in with her in 6 weeks and check on her progress at that time and can consider further diagnostic evaluation with further advanced imaging of her spine at that time if any lack of improvement. Low back pain If the back pain does seem to be facet mediated in nature.  She should continue with therapeutic exercises as previously doing in addition to core stabilization exercises provided today.  If any lack of improvement can consider directed injection versus RFA   ++++++++++++++++++++++++++++++++++++++++++++ Orders & Meds: Orders Placed This Encounter  Procedures  . DG Lumbar Spine Complete    Meds ordered this encounter  Medications  . DISCONTD: celecoxib (CELEBREX) 200 MG capsule    Sig: Take 1 capsule (200 mg total) by mouth daily.    Dispense:  30 capsule    Refill:  0  . celecoxib  (CELEBREX) 200 MG capsule    Sig: Take 1 capsule (200 mg total) by mouth daily.    Dispense:  30 capsule    Refill:  2    ++++++++++++++++++++++++++++++++++++++++++++ Follow-up: Return in about 6 weeks (around 12/17/2017).   Pertinent documentation may be included in additional procedure notes, imaging studies, problem based documentation and patient instructions. Please see these sections of the encounter for additional information regarding this visit. CMA/ATC served as Education administrator during this visit. History, Physical, and Plan performed by medical provider. Documentation and orders reviewed and attested to.      Gerda Diss, [place] [facility] Physician

## 2017-11-06 ENCOUNTER — Telehealth (INDEPENDENT_AMBULATORY_CARE_PROVIDER_SITE_OTHER): Payer: Self-pay | Admitting: Orthopaedic Surgery

## 2017-11-06 NOTE — Telephone Encounter (Signed)
Made appointment for the 16th for patient to receive gel injection in knee.

## 2017-11-06 NOTE — Telephone Encounter (Signed)
Sent to [facility]  

## 2017-11-07 DIAGNOSIS — R69 Illness, unspecified: Secondary | ICD-10-CM | POA: Diagnosis not present

## 2017-11-13 DIAGNOSIS — L570 Actinic keratosis: Secondary | ICD-10-CM | POA: Diagnosis not present

## 2017-11-13 DIAGNOSIS — L905 Scar conditions and fibrosis of skin: Secondary | ICD-10-CM | POA: Diagnosis not present

## 2017-11-13 DIAGNOSIS — Z23 Encounter for immunization: Secondary | ICD-10-CM | POA: Diagnosis not present

## 2017-11-14 ENCOUNTER — Ambulatory Visit (INDEPENDENT_AMBULATORY_CARE_PROVIDER_SITE_OTHER): Payer: Medicare HMO | Admitting: Orthopaedic Surgery

## 2017-11-14 ENCOUNTER — Encounter (INDEPENDENT_AMBULATORY_CARE_PROVIDER_SITE_OTHER): Payer: Self-pay | Admitting: Orthopaedic Surgery

## 2017-11-14 DIAGNOSIS — M1711 Unilateral primary osteoarthritis, right knee: Secondary | ICD-10-CM | POA: Diagnosis not present

## 2017-11-14 MED ORDER — HYALURONAN 88 MG/4ML IX SOSY
88.0000 mg | PREFILLED_SYRINGE | INTRA_ARTICULAR | Status: AC | PRN
  Administered 2017-11-14: 88 mg via INTRA_ARTICULAR

## 2017-11-14 NOTE — Progress Notes (Signed)
   Procedure Note  Patient: [name]             Date of Birth: July 16, 1941           MRN: [id number]             Visit Date: 11/14/2017  Procedures: Visit Diagnoses: Unilateral primary osteoarthritis, right knee  Large Joint Inj: R knee on 11/14/2017 4:08 PM Indications: pain and diagnostic evaluation Details: 22 G 1.5 in needle, superolateral approach  Arthrogram: No  Medications: 88 mg Hyaluronan 88 MG/4ML Outcome: tolerated well, no immediate complications Procedure, treatment alternatives, risks and benefits explained, specific risks discussed. Consent was given by the patient. Immediately prior to procedure a time out was called to verify the correct patient, procedure, equipment, support staff and site/side marked as required. Patient was prepped and draped in the usual sterile fashion.     The patient is here for scheduled hyaluronic acid injection in her right knee to treat moderate pain.  Nothing else change in her medical status.  On exam she does have mild valgus malalignment of her right knee with no effusion and painful range of motion.  She tolerated this injection well.  She knows we can always repeat it again in 6 months if needed.  These have helped her significantly and kept her from having surgery.

## 2017-11-27 DIAGNOSIS — I10 Essential (primary) hypertension: Secondary | ICD-10-CM | POA: Diagnosis not present

## 2017-11-27 DIAGNOSIS — Z79899 Other long term (current) drug therapy: Secondary | ICD-10-CM | POA: Diagnosis not present

## 2017-12-07 DIAGNOSIS — H6123 Impacted cerumen, bilateral: Secondary | ICD-10-CM | POA: Diagnosis not present

## 2017-12-17 ENCOUNTER — Encounter: Payer: Self-pay | Admitting: Sports Medicine

## 2017-12-17 ENCOUNTER — Ambulatory Visit: Payer: Medicare HMO | Admitting: Sports Medicine

## 2017-12-17 VITALS — BP 122/82 | HR 61 | Ht 66.0 in | Wt 160.4 lb

## 2017-12-17 DIAGNOSIS — M1712 Unilateral primary osteoarthritis, left knee: Secondary | ICD-10-CM

## 2017-12-17 DIAGNOSIS — M1711 Unilateral primary osteoarthritis, right knee: Secondary | ICD-10-CM

## 2017-12-17 DIAGNOSIS — Z96641 Presence of right artificial hip joint: Secondary | ICD-10-CM

## 2017-12-17 NOTE — Progress Notes (Signed)
[name]. Rigby, [name] at [facility] [phone]  [name] - 77 y.o. female MRN [id number]  Date of birth: 09-Jan-1941  Visit Date: 12/17/2017  PCP: Lajean Manes, MD   Referred by: Lajean Manes, MD   Scribe for today's visit: Josepha Pigg, CMA     SUBJECTIVE:  [name] is here for Follow-up (bilateral knee pain, leg length discrepancy)  Compared to the last office visit, her previously described symptoms show no change.  Current symptoms are mild & radiate into the lateral aspect of the RT calf occasionally.  She has been using lift I left shoe with some relief. She took Celebrex for 5 days and then stopped. She did get some relief from that. She has been doing "Indepence From Pain" video online and feels that this has been beneficial. She has been taking Glucosamine and Aleve.   She report the Celebrex seemed to be more beneficial for her than believe that she has been taking Aleve chronically and reverted to that when she was done with the first 5 days.   ROS Denies night time disturbances. Denies fevers, chills, or night sweats. Denies unexplained weight loss. Reports personal history of cancer. Denies changes in bowel or bladder habits. Denies recent unreported falls. Denies new or worsening dyspnea or wheezing. Denies headaches or dizziness.  Denies numbness, tingling or weakness  In the extremities.  Denies dizziness or presyncopal episodes Denies lower extremity edema     HISTORY & PERTINENT PRIOR DATA:  Prior History reviewed and updated per electronic medical record.  Significant history, findings, studies and interim changes include:  reports that she quit smoking about 31 years ago. she has never used smokeless tobacco. No results for input(s): HGBA1C, LABURIC, CREATINE in the last 8760 hours. No specialty comments available. Problem  Unilateral Primary Osteoarthritis, Right Knee    OBJECTIVE:  VS:   HT:5\' 6"  (167.6 cm)   WT:160 lb 6.4 oz (72.8 kg)  BMI:25.9    BP:122/82  HR:61bpm  TEMP: ( )  RESP:97 %   PHYSICAL EXAM: Constitutional: WDWN, Non-toxic appearing. Psychiatric: Alert & appropriately interactive.  Not depressed or anxious appearing. Respiratory: No increased work of breathing.  Trachea Midline Eyes: Pupils are equal.  EOM intact without nystagmus.  No scleral icterus  NEUROVASCULAR exam: No clubbing or cyanosis appreciated No significant venous stasis changes Capillary Refill: normal, less than 2 seconds   Markedly tight hip flexors.  She has good strength with T FL hip abduction testing but has poor motor control and weakness with glute medius testing.    Right knee has generalized osteoarthritic bossing with a 5 degree valgus deformity with 3-4 mm of opening with varus testing; increased laxity with 30 degrees of flexion.  Marked crepitation and grinding with patellar grind and passive and active knee flexion/extension.   Hip with overall good internal and external rotation that is smooth and pain-free.  ASSESSMENT & PLAN:   1. Status post total replacement of right hip   2. Unilateral primary osteoarthritis, left knee   3. Unilateral primary osteoarthritis, right knee    PLAN:    Unilateral primary osteoarthritis, right knee New therapeutic exercises reviewed today.  She has the severe amount of arthritis in her knee that has a slight valgus deformity.  We discussed that total knee arthroplasty is likely the definitive treatment of choice for this.  In the interim we discussed long-term corticosteroid injection with still red and will get her preapproved  for this.  The Visco supplementation is received a month ago has not provided.  Did discuss that we would likely need a 12-18-week waiting period following his already injection prior to total knee arthroplasty but this is still unclear on exactly how long waiting period should be but six weeks from the end of  efficacy to be appropriate if she does elect for arthroplasty.   ++++++++++++++++++++++++++++++++++++++++++++ Follow-up: Return in about 8 weeks (around 02/11/2018).  To consider Zilretta Injections  Pertinent documentation may be included in additional procedure notes, imaging studies, problem based documentation and patient instructions. Please see these sections of the encounter for additional information regarding this visit. CMA/ATC served as Education administrator during this visit. History, Physical, and Plan performed by medical provider. Documentation and orders reviewed and attested to.      Gerda Diss, [place] [facility] Physician

## 2017-12-17 NOTE — Patient Instructions (Signed)
Please perform the exercise program that we have prepared for you and gone over in detail on a daily basis.  In addition to the handout you were provided you can access your program through: [email or web address]   Your unique program code is: [email or web address]

## 2017-12-17 NOTE — Assessment & Plan Note (Signed)
New therapeutic exercises reviewed today.  She has the severe amount of arthritis in her knee that has a slight valgus deformity.  We discussed that total knee arthroplasty is likely the definitive treatment of choice for this.  In the interim we discussed long-term corticosteroid injection with still red and will get her preapproved for this.  The Visco supplementation is received a month ago has not provided.  Did discuss that we would likely need a 12-18-week waiting period following his already injection prior to total knee arthroplasty but this is still unclear on exactly how long waiting period should be but six weeks from the end of efficacy to be appropriate if she does elect for arthroplasty.

## 2017-12-17 NOTE — Procedures (Signed)
PROCEDURE NOTE: THERAPEUTIC EXERCISES (97110) 15 minutes spent for Therapeutic exercises as below and as referenced in the AVS. This included exercises focusing on stretching, strengthening, with significant focus on eccentric aspects.  Proper technique shown and discussed handout in great detail with ATC. All questions were discussed and answered.   Long term goals include an improvement in range of motion, strength, endurance as well as avoiding reinjury. Frequency of visits is one time as determined during today's  office visit. Frequency of exercises to be performed is as per handout.  EXERCISES REVIEWED:  Hip abduction exercises  Hip flexor stretching  Lumbar stabilization exercises

## 2017-12-26 ENCOUNTER — Ambulatory Visit: Payer: PPO | Admitting: Podiatry

## 2017-12-26 ENCOUNTER — Telehealth (INDEPENDENT_AMBULATORY_CARE_PROVIDER_SITE_OTHER): Payer: Self-pay | Admitting: Orthopaedic Surgery

## 2017-12-26 NOTE — Telephone Encounter (Signed)
Please advise 

## 2017-12-26 NOTE — Telephone Encounter (Signed)
Pt called and left VM for a new prescription for her shoe lift. Pt stopped by [employer]

## 2017-12-26 NOTE — Telephone Encounter (Signed)
See script.

## 2017-12-27 ENCOUNTER — Ambulatory Visit: Payer: Medicare HMO | Admitting: Podiatry

## 2017-12-27 ENCOUNTER — Ambulatory Visit (INDEPENDENT_AMBULATORY_CARE_PROVIDER_SITE_OTHER): Payer: Medicare HMO

## 2017-12-27 ENCOUNTER — Encounter: Payer: Self-pay | Admitting: Podiatry

## 2017-12-27 DIAGNOSIS — L84 Corns and callosities: Secondary | ICD-10-CM

## 2017-12-27 DIAGNOSIS — M779 Enthesopathy, unspecified: Secondary | ICD-10-CM | POA: Diagnosis not present

## 2017-12-27 DIAGNOSIS — M2042 Other hammer toe(s) (acquired), left foot: Secondary | ICD-10-CM | POA: Diagnosis not present

## 2017-12-27 MED ORDER — TRIAMCINOLONE ACETONIDE 10 MG/ML IJ SUSP
10.0000 mg | Freq: Once | INTRAMUSCULAR | Status: AC
  Administered 2017-12-27: 10 mg

## 2017-12-27 NOTE — Progress Notes (Signed)
Subjective:   Patient ID: [name], female   DOB: 77 y.o.   MRN: [id number]   HPI Patient presents with a new deformity between the big toe second toe left foot with inflammation of the inner phalangeal joint left big toe with keratotic lesion on both the first and second digit left.  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  Patient does not smoke currently and at this time will get a focus on the big toe which is the most inflamed.  I did proximal nerve block and then sterile prep was applied and I injected the interphalangeal joint left big toe with 2 mg Dexasone Kenalog and did sterile debridement of the hallux second toe and applied padding to the hallux.  Reappoint to recheck again as symptoms indicate  X-rays indicate that there is structural changes between the hallux and second toe left and abutment of the 2 digits

## 2017-12-27 NOTE — Telephone Encounter (Signed)
LMOM for patient letting her know that I faxed Rx to [facility] for her

## 2017-12-31 ENCOUNTER — Telehealth (INDEPENDENT_AMBULATORY_CARE_PROVIDER_SITE_OTHER): Payer: Self-pay | Admitting: Orthopaedic Surgery

## 2017-12-31 NOTE — Telephone Encounter (Signed)
Patient was given a brochure by Dr. Paulla Fore about Zilretta injections since she has a new knee injury, she says "he is enthusiastic about these injections because patients are having about 3 months of pain relief". Patient is not liking the side effects of the injection. She is wondering what Dr. Adela Ports thoughts are about this and wanted his opinion before she proceeded.  Please advise # [phone]

## 2017-12-31 NOTE — Telephone Encounter (Signed)
Returned call to patient left message to call back   [phone]

## 2017-12-31 NOTE — Telephone Encounter (Signed)
See below

## 2018-01-07 DIAGNOSIS — L570 Actinic keratosis: Secondary | ICD-10-CM | POA: Diagnosis not present

## 2018-01-16 ENCOUNTER — Telehealth (INDEPENDENT_AMBULATORY_CARE_PROVIDER_SITE_OTHER): Payer: Self-pay | Admitting: Orthopaedic Surgery

## 2018-01-16 NOTE — Telephone Encounter (Signed)
Patient called to inquire about Zilrepta. Wants to speak with Dr. Ninfa Linden or staff in regard to this injection.  Please call to advise

## 2018-01-16 NOTE — Telephone Encounter (Signed)
See below  She wants to know you guys opinion on this medication

## 2018-01-16 NOTE — Telephone Encounter (Signed)
It is triamcinolone but we do not have experience with it

## 2018-01-17 DIAGNOSIS — R69 Illness, unspecified: Secondary | ICD-10-CM | POA: Diagnosis not present

## 2018-01-17 DIAGNOSIS — I10 Essential (primary) hypertension: Secondary | ICD-10-CM | POA: Diagnosis not present

## 2018-01-17 DIAGNOSIS — Z803 Family history of malignant neoplasm of breast: Secondary | ICD-10-CM | POA: Diagnosis not present

## 2018-01-17 DIAGNOSIS — M1990 Unspecified osteoarthritis, unspecified site: Secondary | ICD-10-CM | POA: Diagnosis not present

## 2018-01-17 DIAGNOSIS — Z823 Family history of stroke: Secondary | ICD-10-CM | POA: Diagnosis not present

## 2018-01-17 DIAGNOSIS — Z791 Long term (current) use of non-steroidal anti-inflammatories (NSAID): Secondary | ICD-10-CM | POA: Diagnosis not present

## 2018-01-17 DIAGNOSIS — Z809 Family history of malignant neoplasm, unspecified: Secondary | ICD-10-CM | POA: Diagnosis not present

## 2018-01-17 DIAGNOSIS — H547 Unspecified visual loss: Secondary | ICD-10-CM | POA: Diagnosis not present

## 2018-01-17 DIAGNOSIS — Z8249 Family history of ischemic heart disease and other diseases of the circulatory system: Secondary | ICD-10-CM | POA: Diagnosis not present

## 2018-01-21 NOTE — Telephone Encounter (Signed)
I have no experience with it, but it is a steroid I believe.

## 2018-01-21 NOTE — Telephone Encounter (Signed)
What are your opinions on this?

## 2018-01-21 NOTE — Telephone Encounter (Signed)
Patient aware per Ninfa Linden he has heard good things and to go ahead and try with Dr.Rigby

## 2018-01-22 ENCOUNTER — Ambulatory Visit: Payer: Medicare HMO | Admitting: Podiatry

## 2018-01-22 ENCOUNTER — Encounter: Payer: Self-pay | Admitting: Podiatry

## 2018-01-22 ENCOUNTER — Ambulatory Visit (INDEPENDENT_AMBULATORY_CARE_PROVIDER_SITE_OTHER): Payer: Medicare HMO

## 2018-01-22 DIAGNOSIS — M779 Enthesopathy, unspecified: Secondary | ICD-10-CM

## 2018-01-22 DIAGNOSIS — M7752 Other enthesopathy of left foot: Secondary | ICD-10-CM

## 2018-01-22 NOTE — Progress Notes (Signed)
Subjective: [name] presents the office today for concerns of pain to her left second toe pointing submetatarsal 2 on the left foot as well as for some burning pain in the second digit.  She denies any recent injury or trauma.  She has noticed some mild swelling to the area but denies any redness or warmth. She recently saw Dr. Paulla Dolly and she had a steroid injection to the toe and trimming of the corn but is feeling better.  She does work still on the [employer]".  She has no other concerns today. Denies any systemic complaints such as fevers, chills, nausea, vomiting. No acute changes since last appointment, and no other complaints at this time.   Objective: AAO x3, NAD DP/PT pulses palpable bilaterally, CRT less than 3 seconds On the left foot HAV is present and there is decreased range of motion of the first MTPJ with crepitation.  There is tenderness the second MTPJ in the left foot and submetatarsal 2.  There is mild swelling to this area but there is no erythema or increase in warmth.  There is no fluctuation or crepitation.  There is no open sores identified.  Toe was sitting slightly medially deviated position.  Decreased range of motion of the first MTPJ. No open lesions or pre-ulcerative lesions.  No pain with calf compression, swelling, warmth, erythema  Assessment: Capsulitis left 2nd MTPJ   Plan: -All treatment options discussed with the patient including all alternatives, risks, complications.  -X-rays were obtained and reviewed.  There is no evidence of acute fracture or stress fracture.  Arthritic changes present the first MPJ. -Discussed a steroid injection she wishes to proceed.  Skin was prepped with Betadine and a steroid injection was performed consisting of 1 cc of Kenalog 10, 0.5 cc of 0.5% Marcaine plain and 0.5 cc of 1% lidocaine plain without complications.  Postinjection care was discussed.  Offloading pads were dispensed.  Discussed orthotics. -RTC 2 weeks or sooner  if needed. -Patient encouraged to call the office with any questions, concerns, change in symptoms.   Trula Slade DPM

## 2018-02-05 ENCOUNTER — Ambulatory Visit: Payer: Medicare HMO | Admitting: Podiatry

## 2018-02-05 ENCOUNTER — Encounter: Payer: Self-pay | Admitting: Podiatry

## 2018-02-05 DIAGNOSIS — M779 Enthesopathy, unspecified: Secondary | ICD-10-CM

## 2018-02-05 NOTE — Progress Notes (Signed)
Subjective: [name] presents to the office today for follow-up evaluation of her left foot pain.  She said the injection did help but she still gets some mild discomfort.  He has not been taking Celebrex denies any recent injury or trauma.  She does work standing on hard surfaces which does aggravate her symptoms.  No numbness or tingling.  She has no other concerns today. Denies any systemic complaints such as fevers, chills, nausea, vomiting. No acute changes since last appointment, and no other complaints at this time.   Objective: AAO x3, NAD DP/PT pulses palpable bilaterally, CRT less than 3 seconds There is also mild discomfort on the second interspace of the patient's left foot but there is no area pinpoint bony tenderness or pain to vibratory sensation.  Mild discomfort the second MPJ.  There is decrease edema to the area there is no erythema or increase in warmth.  There is no other area of tenderness identified at this time. No open lesions or pre-ulcerative lesions.  No pain with calf compression, swelling, warmth, erythema  Assessment: Capsulitis left second MTPJ  Plan: -All treatment options discussed with the patient including all alternatives, risks, complications.  -Overall her symptoms have improved.  You want her to start taking her Celebrex that she has at home to help with the remaining discomfort.  We will hold another steroid injection today she is doing better.  Also icing to the area daily.  I did add a pad inside of the insert for her shoe to help take pressure off the area.  If symptoms do not resolve in the next 2-3 weeks to call the office for follow-up otherwise I will see her back as needed. -Patient encouraged to call the office with any questions, concerns, change in symptoms.   Trula Slade DPM

## 2018-02-11 ENCOUNTER — Ambulatory Visit (INDEPENDENT_AMBULATORY_CARE_PROVIDER_SITE_OTHER): Payer: Medicare HMO | Admitting: Sports Medicine

## 2018-02-11 ENCOUNTER — Encounter: Payer: Self-pay | Admitting: Sports Medicine

## 2018-02-11 VITALS — BP 124/70 | HR 58 | Ht 66.0 in | Wt 156.4 lb

## 2018-02-11 DIAGNOSIS — M2170 Unequal limb length (acquired), unspecified site: Secondary | ICD-10-CM | POA: Diagnosis not present

## 2018-02-11 DIAGNOSIS — R269 Unspecified abnormalities of gait and mobility: Secondary | ICD-10-CM

## 2018-02-11 DIAGNOSIS — M545 Low back pain: Secondary | ICD-10-CM

## 2018-02-11 DIAGNOSIS — M170 Bilateral primary osteoarthritis of knee: Secondary | ICD-10-CM | POA: Diagnosis not present

## 2018-02-11 NOTE — Progress Notes (Signed)
[name]. [name], [place] at [facility] [phone]  [name] - 77 y.o. female MRN [id number]  Date of birth: 1941-02-07  Visit Date: 02/11/2018  PCP: Lajean Manes, MD   Referred by: Lajean Manes, MD  Scribe for today's visit: Wendy Poet, LAT, ATC     SUBJECTIVE:  [name] is here for Follow-up (B knee pain) .   12/17/17: Compared to the last office visit, her previously described symptoms show no change.  Current symptoms are mild & radiate into the lateral aspect of the RT calf occasionally.  She has been using lift I left shoe with some relief. She took Celebrex for 5 days and then stopped. She did get some relief from that. She has been doing "Indepence From Pain" video online and feels that this has been beneficial. She has been taking Glucosamine and Aleve.   She report the Celebrex seemed to be more beneficial for her than believe that she has been taking Aleve chronically and reverted to that when she was done with the first 5 days.  02/11/18: Compared to the last office visit, her previously described B knee pain symptoms are worsening, particularly the R knee.  She notes that her knees feel "stiffer and stiffer."  She describes the most difficulty w/ weight bearing activity (stairs).  She notes that her lower back has been feeling much better after doing the [employer]. Current symptoms are 7.5/10  & are nonradiating She has been taking glucosamine and Celebrex.  She has been doing [employer].  ROS Denies night time disturbances. Denies fevers, chills, or night sweats. Denies unexplained weight loss. Reports personal history of cancer - skin cancer Denies changes in bowel or bladder habits. Denies recent unreported falls. Denies new or worsening dyspnea or wheezing. Denies headaches or dizziness.  Denies numbness, tingling or weakness  In the extremities.  Denies dizziness or presyncopal episodes Denies  lower extremity edema    HISTORY & PERTINENT PRIOR DATA:  Prior History reviewed and updated per electronic medical record.  Significant/pertinent history, findings, studies include:  reports that she quit smoking about 31 years ago. She has never used smokeless tobacco. No results for input(s): HGBA1C, LABURIC, CREATINE in the last 8760 hours. Zilretta covered at 80% by [employer] 05/2018. Pa# [id number] - [facility] No problems updated.  OBJECTIVE:  VS:  HT:5\' 6"  (167.6 cm)   WT:156 lb 6.4 oz (70.9 kg)  BMI:25.26    BP:124/70  HR:(Abnormal) 58bpm  TEMP: ( )  RESP:97 %   PHYSICAL EXAM: Constitutional: WDWN, Non-toxic appearing. Psychiatric: Alert & appropriately interactive.  Not depressed or anxious appearing. Respiratory: No increased work of breathing.  Trachea Midline Eyes: Pupils are equal.  EOM intact without nystagmus.  No scleral icterus  Vascular Exam: warm to touch no edema  lower extremity neuro exam: unremarkable normal strength  MSK Exam: Bilateral knees: Bilateral osteoarthritic bossing with slight valgus deformity.  Full flexion and extension.  Small effusion on the right and moderate on the left.  Generalized synovitis is present bilaterally.  Extensor mechanism intact. Antalgic gait    ASSESSMENT & PLAN:   1. Primary osteoarthritis of both knees   2. Low back pain, unspecified back pain laterality, unspecified chronicity, with sciatica presence unspecified   3. Gait disturbance   4. Leg length difference, acquired     PLAN: Patient would like to defer long-acting Zilretta injections at this time until she calls for follow-up.  She does  plan to do Visco supplementation with Dr. Ninfa Linden and MRIs happy to defer that to him.  I am happy to see her back at any time she will continue with lower extremity therapeutic exercises and back exercises with the foundations training videos.  Follow-up as needed.  Follow-up: Return if symptoms worsen or fail to improve.        Please see additional documentation for Objective, Assessment and Plan sections. Pertinent additional documentation may be included in corresponding procedure notes, imaging studies, problem based documentation and patient instructions. Please see these sections of the encounter for additional information regarding this visit.  CMA/ATC served as Education administrator during this visit. History, Physical, and Plan performed by medical provider. Documentation and orders reviewed and attested to.      Gerda Diss, [place] [facility] Physician

## 2018-02-13 ENCOUNTER — Telehealth (INDEPENDENT_AMBULATORY_CARE_PROVIDER_SITE_OTHER): Payer: Self-pay | Admitting: Orthopaedic Surgery

## 2018-02-13 NOTE — Telephone Encounter (Signed)
Told her it was going to be too early at that point for another Monovisc but told her she may have a cortisone injection at that time

## 2018-02-13 NOTE — Telephone Encounter (Signed)
Patient called for an earlier appointment scheduled 03/06/18 at 9:15am for a get injection in her right knee. Patient is checking to see if the injection has arrived. The number to contact patient is [phone]

## 2018-02-27 DIAGNOSIS — I10 Essential (primary) hypertension: Secondary | ICD-10-CM | POA: Diagnosis not present

## 2018-02-27 DIAGNOSIS — D126 Benign neoplasm of colon, unspecified: Secondary | ICD-10-CM | POA: Diagnosis not present

## 2018-02-27 DIAGNOSIS — R69 Illness, unspecified: Secondary | ICD-10-CM | POA: Diagnosis not present

## 2018-02-27 DIAGNOSIS — Z79899 Other long term (current) drug therapy: Secondary | ICD-10-CM | POA: Diagnosis not present

## 2018-02-27 DIAGNOSIS — E7800 Pure hypercholesterolemia, unspecified: Secondary | ICD-10-CM | POA: Diagnosis not present

## 2018-02-27 DIAGNOSIS — Z0000 Encounter for general adult medical examination without abnormal findings: Secondary | ICD-10-CM | POA: Diagnosis not present

## 2018-02-27 DIAGNOSIS — Z23 Encounter for immunization: Secondary | ICD-10-CM | POA: Diagnosis not present

## 2018-03-06 ENCOUNTER — Encounter (INDEPENDENT_AMBULATORY_CARE_PROVIDER_SITE_OTHER): Payer: Self-pay | Admitting: Orthopaedic Surgery

## 2018-03-06 ENCOUNTER — Telehealth (INDEPENDENT_AMBULATORY_CARE_PROVIDER_SITE_OTHER): Payer: Self-pay

## 2018-03-06 ENCOUNTER — Ambulatory Visit (INDEPENDENT_AMBULATORY_CARE_PROVIDER_SITE_OTHER): Payer: Medicare HMO | Admitting: Orthopaedic Surgery

## 2018-03-06 DIAGNOSIS — G8929 Other chronic pain: Secondary | ICD-10-CM | POA: Diagnosis not present

## 2018-03-06 DIAGNOSIS — M1711 Unilateral primary osteoarthritis, right knee: Secondary | ICD-10-CM | POA: Diagnosis not present

## 2018-03-06 DIAGNOSIS — M25561 Pain in right knee: Secondary | ICD-10-CM

## 2018-03-06 MED ORDER — LIDOCAINE HCL 1 % IJ SOLN
3.0000 mL | INTRAMUSCULAR | Status: AC | PRN
  Administered 2018-03-06: 3 mL

## 2018-03-06 MED ORDER — METHYLPREDNISOLONE ACETATE 40 MG/ML IJ SUSP
40.0000 mg | INTRAMUSCULAR | Status: AC | PRN
  Administered 2018-03-06: 40 mg via INTRA_ARTICULAR

## 2018-03-06 NOTE — Telephone Encounter (Signed)
Submitted application online for Monovisc injection, right knee. Appt.needs to be scheduled after 05/14/18 since her last injection was 11/14/17.

## 2018-03-06 NOTE — Progress Notes (Signed)
Office Visit Note   Patient: [name]           Date of Birth: [birth year]           MRN: [id number] Visit Date: 03/06/2018              Requested by: Lajean Manes, MD [address] [place], [place] [address] PCP: Lajean Manes, MD   Assessment & Plan: Visit Diagnoses:  1. Unilateral primary osteoarthritis, right knee   2. Chronic pain of right knee     Plan: We both feel that staying conservative is the appropriate measure for now given the fact that injections have helped. We placed a steroid injection in her right knee today and we will see her back in July to place hyaluronic acid in the right knee.  All questions concerns were answered and addressed.  Follow-Up Instructions: Return in about 2 months (around 05/06/2018).   Orders:  No orders of the defined types were placed in this encounter.  No orders of the defined types were placed in this encounter.     Procedures: Large Joint Inj: R knee on 03/06/2018 9:29 AM Indications: diagnostic evaluation and pain Details: 22 G 1.5 in needle, superolateral approach  Arthrogram: No  Medications: 3 mL lidocaine 1 %; 40 mg methylPREDNISolone acetate 40 MG/ML Outcome: tolerated well, no immediate complications Procedure, treatment alternatives, risks and benefits explained, specific risks discussed. Consent was given by the patient. Immediately prior to procedure a time out was called to verify the correct patient, procedure, equipment, support staff and site/side marked as required. Patient was prepped and draped in the usual sterile fashion.       Clinical Data: No additional findings.   Subjective: Chief Complaint  Patient presents with  . Right Knee - Pain    S/p Monovisc injection 11/14/17  The patient comes in today with worsening right knee pain.  She has had steroid injections in the past and in January of this year had a hyaluronic acid injection with Monovisc in her right knee.  She has moderate  arthritis in that knee.  She would like to have a steroid again today and consider repeat hyaluronic acid injection in July which will be after in 6 months since her injection and that we will work well for her.  She still opening to pursue this conservative route since it helps her enough to keep her from avoiding knee replacement surgery.  There is been no other change in her medical status.  HPI  Review of Systems She currently denies any headache, chest pain, shortness of breath, fever, chills, nausea, vomiting.  Objective: Vital Signs: LMP 10/31/1991   Physical Exam She is alert and oriented x3 and in no acute distress Ortho Exam Examination of her right knee shows mild valgus malalignment with patellofemoral crepitation as well as medial lateral joint line tenderness.  The knee feels grossly stable with normal range of motion.  Specialty Comments:  No specialty comments available.  Imaging: No results found.   PMFS History: Patient Active Problem List   Diagnosis Date Noted  . Chronic pain of left knee 09/06/2017  . Unilateral primary osteoarthritis, left knee 09/06/2017  . Chronic pain of right knee 09/06/2017  . Leg length difference, acquired 03/06/2017  . Low back pain 03/06/2017  . Unilateral primary osteoarthritis, right knee 12/06/2016  . 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 Degenerative arthritis of hip 04/19/2012  . Anxiety   .  Cancer ([place])   . Depression   . Arthritis   . RENAL CALCULUS, RIGHT 12/15/2008  . ABDOMINAL PAIN, UNSPECIFIED SITE 12/15/2008  . NEPHROLITHIASIS, HX OF 12/15/2008  . ANXIETY DEPRESSION 10/29/2007  . CONTACT DERMATITIS DUE TO SOLAR RADIATION 10/29/2007  . OSTEOARTHROSIS, LOCAL, PRIMARY, UNSPC SITE 08/16/2007   Past Medical History:  Diagnosis Date  . Anxiety   . Arthritis    lower back, hip  . Back pain    generally "causes severe stiffness"  . Complication of anesthesia    "felt it made her feel very goofy  for several months"  . Depression   . History of kidney stones    x1 episode  . 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 Osteopenia   . 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 Cancer Father        prostate  . Hypertension Father   . Heart attack Father   . Cancer Brother        Occupational psychologist  . Cancer Maternal Grandmother        throat  . Breast cancer Paternal Grandmother     Past Surgical History:  Procedure Laterality Date  . APPENDECTOMY  2010   open  . BREAST LUMPECTOMY  1961  . TOTAL HIP ARTHROPLASTY  04/19/2012   Procedure: TOTAL HIP ARTHROPLASTY ANTERIOR APPROACH;  Surgeon: Mcarthur Rossetti, MD;  Location: [facility];  Service: Orthopedics;  Laterality: Left;  Left Total Hip Arthroplasty  . TOTAL HIP ARTHROPLASTY Right 04/21/2016   Procedure: RIGHT TOTAL HIP ARTHROPLASTY ANTERIOR APPROACH;  Surgeon: Mcarthur Rossetti, MD;  Location: [facility];  Service: Orthopedics;  Laterality: Right;   Social History   Occupational History  . Not on file  Tobacco Use  . Smoking status: Former Smoker    Last attempt to quit: 04/15/1986    Years since quitting: 31.9  . Smokeless tobacco: Never Used  Substance and Sexual Activity  . Alcohol use: Yes    Alcohol/week: 3.5 oz    Types: 7 Standard drinks or equivalent per week    Comment: 1 drink daily  . Drug use: No  . Sexual activity: Never

## 2018-04-02 ENCOUNTER — Ambulatory Visit (INDEPENDENT_AMBULATORY_CARE_PROVIDER_SITE_OTHER): Payer: PPO | Admitting: Orthopaedic Surgery

## 2018-04-03 ENCOUNTER — Telehealth (INDEPENDENT_AMBULATORY_CARE_PROVIDER_SITE_OTHER): Payer: Self-pay

## 2018-04-03 NOTE — Telephone Encounter (Signed)
Initiated PA for Monovisc, right knee with [employer].  Advised by Claudina Lick PA form will be faxed to our office.   Appt.has to be scheduled after 05/14/2018.

## 2018-04-04 ENCOUNTER — Other Ambulatory Visit: Payer: Self-pay

## 2018-04-04 MED ORDER — CELECOXIB 200 MG PO CAPS
200.0000 mg | ORAL_CAPSULE | Freq: Every day | ORAL | 2 refills | Status: DC
Start: 2018-04-04 — End: 2018-04-19

## 2018-04-18 ENCOUNTER — Telehealth (INDEPENDENT_AMBULATORY_CARE_PROVIDER_SITE_OTHER): Payer: Self-pay

## 2018-04-18 NOTE — Telephone Encounter (Signed)
Faxed completed PA form to [employer] at [phone]

## 2018-04-19 ENCOUNTER — Other Ambulatory Visit: Payer: Self-pay | Admitting: Sports Medicine

## 2018-04-24 ENCOUNTER — Encounter (INDEPENDENT_AMBULATORY_CARE_PROVIDER_SITE_OTHER): Payer: Self-pay | Admitting: Radiology

## 2018-04-24 NOTE — Progress Notes (Signed)
Patient will owe 20% OOP for Monovisc right knee.  PA obtained, # [id number], valid through 08/14/18. Patient has appt on 05/06/18 for injection.

## 2018-04-29 ENCOUNTER — Telehealth (INDEPENDENT_AMBULATORY_CARE_PROVIDER_SITE_OTHER): Payer: Self-pay

## 2018-04-29 NOTE — Telephone Encounter (Signed)
Called and left a VM for patient to return my call to R/S appointment after 05/14/18 to receive gel injection, it will be 6 months after 05/14/18.

## 2018-05-01 DIAGNOSIS — R69 Illness, unspecified: Secondary | ICD-10-CM | POA: Diagnosis not present

## 2018-05-06 ENCOUNTER — Ambulatory Visit (INDEPENDENT_AMBULATORY_CARE_PROVIDER_SITE_OTHER): Payer: Medicare HMO | Admitting: Orthopaedic Surgery

## 2018-05-22 ENCOUNTER — Ambulatory Visit (INDEPENDENT_AMBULATORY_CARE_PROVIDER_SITE_OTHER): Payer: Medicare HMO | Admitting: Orthopaedic Surgery

## 2018-05-22 ENCOUNTER — Encounter (INDEPENDENT_AMBULATORY_CARE_PROVIDER_SITE_OTHER): Payer: Self-pay | Admitting: Orthopaedic Surgery

## 2018-05-22 DIAGNOSIS — M1711 Unilateral primary osteoarthritis, right knee: Secondary | ICD-10-CM

## 2018-05-22 MED ORDER — HYALURONAN 88 MG/4ML IX SOSY
88.0000 mg | PREFILLED_SYRINGE | INTRA_ARTICULAR | Status: AC | PRN
  Administered 2018-05-22: 88 mg via INTRA_ARTICULAR

## 2018-05-22 NOTE — Progress Notes (Signed)
   Procedure Note  Patient: [name]             Date of Birth: October 30, 1941           MRN: [id number]             Visit Date: 05/22/2018  Procedures: Visit Diagnoses: Unilateral primary osteoarthritis, right knee  Large Joint Inj: R knee on 05/22/2018 8:53 AM Indications: diagnostic evaluation and pain Details: 22 G 1.5 in needle, superolateral approach  Arthrogram: No  Medications: 88 mg Hyaluronan 88 MG/4ML Outcome: tolerated well, no immediate complications Procedure, treatment alternatives, risks and benefits explained, specific risks discussed. Consent was given by the patient. Immediately prior to procedure a time out was called to verify the correct patient, procedure, equipment, support staff and site/side marked as required. Patient was prepped and draped in the usual sterile fashion.    The patient comes in today for scheduled hyaluronic acid injection of Monovisc in the right knee to treat the pain from osteoarthritis.  She is doing well otherwise.  She is 77 years old and is very active individual.  She is traveling in 10 days and with walking on uneven surfaces.  On examination she has excellent range of motion of her right knee with no significant effusion.  She has global pain though.  She tolerates it well.  All question concerns were answered and addressed.  We placed the injection her knee without difficulty.  Follow-up will be as needed.

## 2018-05-28 DIAGNOSIS — Z79899 Other long term (current) drug therapy: Secondary | ICD-10-CM | POA: Diagnosis not present

## 2018-05-28 DIAGNOSIS — E7800 Pure hypercholesterolemia, unspecified: Secondary | ICD-10-CM | POA: Diagnosis not present

## 2018-06-17 DIAGNOSIS — Z86010 Personal history of colonic polyps: Secondary | ICD-10-CM | POA: Diagnosis not present

## 2018-06-17 DIAGNOSIS — K644 Residual hemorrhoidal skin tags: Secondary | ICD-10-CM | POA: Diagnosis not present

## 2018-06-17 DIAGNOSIS — K6289 Other specified diseases of anus and rectum: Secondary | ICD-10-CM | POA: Diagnosis not present

## 2018-06-17 DIAGNOSIS — D126 Benign neoplasm of colon, unspecified: Secondary | ICD-10-CM | POA: Diagnosis not present

## 2018-06-17 DIAGNOSIS — K5730 Diverticulosis of large intestine without perforation or abscess without bleeding: Secondary | ICD-10-CM | POA: Diagnosis not present

## 2018-06-19 DIAGNOSIS — D126 Benign neoplasm of colon, unspecified: Secondary | ICD-10-CM | POA: Diagnosis not present

## 2018-06-24 DIAGNOSIS — D485 Neoplasm of uncertain behavior of skin: Secondary | ICD-10-CM | POA: Diagnosis not present

## 2018-06-24 DIAGNOSIS — D1801 Hemangioma of skin and subcutaneous tissue: Secondary | ICD-10-CM | POA: Diagnosis not present

## 2018-06-24 DIAGNOSIS — C44722 Squamous cell carcinoma of skin of right lower limb, including hip: Secondary | ICD-10-CM | POA: Diagnosis not present

## 2018-06-24 DIAGNOSIS — L821 Other seborrheic keratosis: Secondary | ICD-10-CM | POA: Diagnosis not present

## 2018-06-24 DIAGNOSIS — L814 Other melanin hyperpigmentation: Secondary | ICD-10-CM | POA: Diagnosis not present

## 2018-06-24 DIAGNOSIS — L570 Actinic keratosis: Secondary | ICD-10-CM | POA: Diagnosis not present

## 2018-06-24 DIAGNOSIS — D225 Melanocytic nevi of trunk: Secondary | ICD-10-CM | POA: Diagnosis not present

## 2018-06-24 DIAGNOSIS — Z85828 Personal history of other malignant neoplasm of skin: Secondary | ICD-10-CM | POA: Diagnosis not present

## 2018-06-24 DIAGNOSIS — C44329 Squamous cell carcinoma of skin of other parts of face: Secondary | ICD-10-CM | POA: Diagnosis not present

## 2018-07-02 DIAGNOSIS — C44329 Squamous cell carcinoma of skin of other parts of face: Secondary | ICD-10-CM | POA: Diagnosis not present

## 2018-07-02 DIAGNOSIS — C44722 Squamous cell carcinoma of skin of right lower limb, including hip: Secondary | ICD-10-CM | POA: Diagnosis not present

## 2018-07-05 DIAGNOSIS — H2513 Age-related nuclear cataract, bilateral: Secondary | ICD-10-CM | POA: Diagnosis not present

## 2018-07-05 DIAGNOSIS — H5213 Myopia, bilateral: Secondary | ICD-10-CM | POA: Diagnosis not present

## 2018-07-05 DIAGNOSIS — H35363 Drusen (degenerative) of macula, bilateral: Secondary | ICD-10-CM | POA: Diagnosis not present

## 2018-07-19 DIAGNOSIS — Z23 Encounter for immunization: Secondary | ICD-10-CM | POA: Diagnosis not present

## 2018-07-23 ENCOUNTER — Telehealth (INDEPENDENT_AMBULATORY_CARE_PROVIDER_SITE_OTHER): Payer: Self-pay | Admitting: Orthopaedic Surgery

## 2018-07-23 NOTE — Telephone Encounter (Signed)
Patient called needing a Rx for a lift for her left shoe. The patient said to fax the Rx to [facility]. The number to contact patient is [phone]

## 2018-07-24 ENCOUNTER — Ambulatory Visit: Payer: Medicare HMO | Admitting: Podiatry

## 2018-07-24 DIAGNOSIS — L570 Actinic keratosis: Secondary | ICD-10-CM | POA: Diagnosis not present

## 2018-07-24 NOTE — Telephone Encounter (Signed)
Please advise 

## 2018-07-24 NOTE — Telephone Encounter (Signed)
Patient aware Rx sent to [facility]

## 2018-07-24 NOTE — Telephone Encounter (Signed)
ok 

## 2018-07-29 ENCOUNTER — Ambulatory Visit: Payer: Medicare HMO | Admitting: Podiatry

## 2018-07-29 ENCOUNTER — Encounter: Payer: Self-pay | Admitting: Podiatry

## 2018-07-29 DIAGNOSIS — M7752 Other enthesopathy of left foot: Secondary | ICD-10-CM | POA: Diagnosis not present

## 2018-07-29 DIAGNOSIS — M779 Enthesopathy, unspecified: Secondary | ICD-10-CM

## 2018-07-29 DIAGNOSIS — M2042 Other hammer toe(s) (acquired), left foot: Secondary | ICD-10-CM | POA: Diagnosis not present

## 2018-07-29 MED ORDER — TRIAMCINOLONE ACETONIDE 10 MG/ML IJ SUSP
10.0000 mg | Freq: Once | INTRAMUSCULAR | Status: AC
  Administered 2018-07-29: 10 mg

## 2018-07-29 NOTE — Progress Notes (Signed)
Subjective:   Patient ID: [name], female   DOB: 77 y.o.   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  For the toe I applied padding and recommended wider shoe gear and for the joint I did do a proximal nerve block aspirated the second MPJ getting out a small amount of clear fluid and injected quarter cc dexamethasone Kenalog and discussed ultimate digital fusion shortening osteotomy if symptoms persist

## 2018-08-19 ENCOUNTER — Encounter (INDEPENDENT_AMBULATORY_CARE_PROVIDER_SITE_OTHER): Payer: Self-pay | Admitting: Orthopaedic Surgery

## 2018-08-19 ENCOUNTER — Ambulatory Visit (INDEPENDENT_AMBULATORY_CARE_PROVIDER_SITE_OTHER): Payer: Medicare HMO | Admitting: Orthopaedic Surgery

## 2018-08-19 VITALS — Ht 66.0 in | Wt 155.0 lb

## 2018-08-19 DIAGNOSIS — G8929 Other chronic pain: Secondary | ICD-10-CM | POA: Diagnosis not present

## 2018-08-19 DIAGNOSIS — M1711 Unilateral primary osteoarthritis, right knee: Secondary | ICD-10-CM | POA: Diagnosis not present

## 2018-08-19 DIAGNOSIS — M25561 Pain in right knee: Secondary | ICD-10-CM

## 2018-08-19 MED ORDER — METHYLPREDNISOLONE ACETATE 40 MG/ML IJ SUSP
40.0000 mg | INTRAMUSCULAR | Status: AC | PRN
  Administered 2018-08-19: 40 mg via INTRA_ARTICULAR

## 2018-08-19 MED ORDER — LIDOCAINE HCL 1 % IJ SOLN
3.0000 mL | INTRAMUSCULAR | Status: AC | PRN
  Administered 2018-08-19: 3 mL

## 2018-08-19 NOTE — Progress Notes (Signed)
Office Visit Note   Patient: [name]           Date of Birth: 02/15/1941           MRN: [id number] Visit Date: 08/19/2018              Requested by: Lajean Manes, MD [address] [place], [place] [address] PCP: Lajean Manes, MD   Assessment & Plan: Visit Diagnoses:  1. Chronic pain of right knee   2. Unilateral primary osteoarthritis, right knee     Plan: Per her wishes I did place a steroid injection in her right knee today.  I showed her knee model we talked about knee replacement surgery.  We a long thorough discussion about her intraoperative and postoperative course and what the surgery involves.  We talked about the risk and benefits of surgery as well.  This is something she does wish to have set up for next year after February 1 when she turns 78.  All question concerns were answered and addressed.  We will have our surgery scheduler call her to schedule the surgery.  Follow-Up Instructions: Return for 2 weeks post-op.   Orders:  No orders of the defined types were placed in this encounter.  No orders of the defined types were placed in this encounter.     Procedures: Large Joint Inj: R knee on 08/19/2018 5:36 PM Indications: diagnostic evaluation and pain Details: 22 G 1.5 in needle, superolateral approach  Arthrogram: No  Medications: 3 mL lidocaine 1 %; 40 mg methylPREDNISolone acetate 40 MG/ML Outcome: tolerated well, no immediate complications Procedure, treatment alternatives, risks and benefits explained, specific risks discussed. Consent was given by the patient. Immediately prior to procedure a time out was called to verify the correct patient, procedure, equipment, support staff and site/side marked as required. Patient was prepped and draped in the usual sterile fashion.       Clinical Data: No additional findings.   Subjective: Chief Complaint  Patient presents with  . Right Knee - Pain  The patient is well-known to me.   She is very pleasant and active 77 year old female with debilitating arthritis involving her right knee.  We have seen her for many years now and she has had multiple steroid injections and hyaluronic acid injections.  She does have a history of bilateral hip replacements.  At this point she would like to have a steroid injection in her right knee today but does wish to be set up for a total knee arthroplasty after February 1 of next year when she turns 77 years old.  She is gotten to where she still very active but her knee pain is daily and is detrimental effect directives daily living, quality of life and her mobility.  She is work on Forensic scientist exercises and activity modification.  She does take anti-inflammatories and again has had multiple injections.  HPI  Review of Systems She currently denies any headache, chest pain, shortness of breath, fever, chills, nausea, vomiting.  Objective: Vital Signs: Ht 5\' 6"  (1.676 m)   Wt 155 lb (70.3 kg)   LMP 10/31/1991   BMI 25.02 kg/m   Physical Exam She is alert and oriented x3 and in no acute distress Ortho Exam Examination of her right knee does show valgus malalignment with full range of motion but a slight effusion and pain throughout the plane of motion.  The pain is mainly lateral joint line but there is significant patellofemoral crepitation. Specialty  Comments:  No specialty comments available.  Imaging: No results found. X-rays from last year of her right knee does show tricompartment arthritic changes mainly involving the lateral compartment with almost complete loss lateral compartment joint space and valgus malalignment.  PMFS History: Patient Active Problem List   Diagnosis Date Noted  . Chronic pain of left knee 09/06/2017  . Unilateral primary osteoarthritis, left knee 09/06/2017  . Chronic pain of right knee 09/06/2017  . Leg length difference, acquired 03/06/2017  . Low back pain 03/06/2017  . Unilateral primary  osteoarthritis, right knee 12/06/2016  . 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 Degenerative arthritis of hip 04/19/2012  . Anxiety   . Cancer ([place])   . Depression   . Arthritis   . RENAL CALCULUS, RIGHT 12/15/2008  . ABDOMINAL PAIN, UNSPECIFIED SITE 12/15/2008  . NEPHROLITHIASIS, HX OF 12/15/2008  . ANXIETY DEPRESSION 10/29/2007  . CONTACT DERMATITIS DUE TO SOLAR RADIATION 10/29/2007  . OSTEOARTHROSIS, LOCAL, PRIMARY, UNSPC SITE 08/16/2007   Past Medical History:  Diagnosis Date  . Anxiety   . Arthritis    lower back, hip  . Back pain    generally "causes severe stiffness"  . Complication of anesthesia    "felt it made her feel very goofy for several months"  . Depression   . History of kidney stones    x1 episode  . 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 Osteopenia   . 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 Cancer Father        prostate  . Hypertension Father   . Heart attack Father   . Cancer Brother        Occupational psychologist  . Cancer Maternal Grandmother        throat  . Breast cancer Paternal Grandmother     Past Surgical History:  Procedure Laterality Date  . APPENDECTOMY  2010   open  . BREAST LUMPECTOMY  1961  . TOTAL HIP ARTHROPLASTY  04/19/2012   Procedure: TOTAL HIP ARTHROPLASTY ANTERIOR APPROACH;  Surgeon: Mcarthur Rossetti, MD;  Location: [facility];  Service: Orthopedics;  Laterality: Left;  Left Total Hip Arthroplasty  . TOTAL HIP ARTHROPLASTY Right 04/21/2016   Procedure: RIGHT TOTAL HIP ARTHROPLASTY ANTERIOR APPROACH;  Surgeon: Mcarthur Rossetti, MD;  Location: [facility];  Service: Orthopedics;  Laterality: Right;   Social History   Occupational History  . Not on file  Tobacco Use  . Smoking status: Former Smoker    Last attempt to quit: 04/15/1986    Years since quitting: 32.3  . Smokeless tobacco: Never Used  Substance and Sexual Activity  . Alcohol use: Yes     Alcohol/week: 7.0 standard drinks    Types: 7 Standard drinks or equivalent per week    Comment: 1 drink daily  . Drug use: No  . Sexual activity: Never

## 2018-09-02 DIAGNOSIS — R69 Illness, unspecified: Secondary | ICD-10-CM | POA: Diagnosis not present

## 2018-09-02 DIAGNOSIS — I10 Essential (primary) hypertension: Secondary | ICD-10-CM | POA: Diagnosis not present

## 2018-09-02 DIAGNOSIS — Z79899 Other long term (current) drug therapy: Secondary | ICD-10-CM | POA: Diagnosis not present

## 2018-09-02 DIAGNOSIS — H9012 Conductive hearing loss, unilateral, left ear, with unrestricted hearing on the contralateral side: Secondary | ICD-10-CM | POA: Diagnosis not present

## 2018-09-11 DIAGNOSIS — H6123 Impacted cerumen, bilateral: Secondary | ICD-10-CM | POA: Diagnosis not present

## 2018-09-12 DIAGNOSIS — H903 Sensorineural hearing loss, bilateral: Secondary | ICD-10-CM | POA: Diagnosis not present

## 2018-09-16 DIAGNOSIS — L570 Actinic keratosis: Secondary | ICD-10-CM | POA: Diagnosis not present

## 2018-09-16 DIAGNOSIS — L814 Other melanin hyperpigmentation: Secondary | ICD-10-CM | POA: Diagnosis not present

## 2018-09-16 DIAGNOSIS — Z23 Encounter for immunization: Secondary | ICD-10-CM | POA: Diagnosis not present

## 2018-09-16 DIAGNOSIS — L821 Other seborrheic keratosis: Secondary | ICD-10-CM | POA: Diagnosis not present

## 2018-09-30 ENCOUNTER — Other Ambulatory Visit: Payer: Self-pay | Admitting: Geriatric Medicine

## 2018-09-30 DIAGNOSIS — Z1231 Encounter for screening mammogram for malignant neoplasm of breast: Secondary | ICD-10-CM

## 2018-10-10 ENCOUNTER — Telehealth (INDEPENDENT_AMBULATORY_CARE_PROVIDER_SITE_OTHER): Payer: Self-pay | Admitting: Radiology

## 2018-10-10 ENCOUNTER — Ambulatory Visit (INDEPENDENT_AMBULATORY_CARE_PROVIDER_SITE_OTHER): Payer: Medicare HMO | Admitting: Orthopaedic Surgery

## 2018-10-10 ENCOUNTER — Ambulatory Visit (INDEPENDENT_AMBULATORY_CARE_PROVIDER_SITE_OTHER): Payer: Medicare HMO

## 2018-10-10 ENCOUNTER — Encounter (INDEPENDENT_AMBULATORY_CARE_PROVIDER_SITE_OTHER): Payer: Self-pay | Admitting: Orthopaedic Surgery

## 2018-10-10 DIAGNOSIS — M25511 Pain in right shoulder: Secondary | ICD-10-CM

## 2018-10-10 DIAGNOSIS — G8929 Other chronic pain: Secondary | ICD-10-CM

## 2018-10-10 MED ORDER — METHYLPREDNISOLONE ACETATE 40 MG/ML IJ SUSP
40.0000 mg | INTRAMUSCULAR | Status: AC | PRN
  Administered 2018-10-10: 40 mg via INTRA_ARTICULAR

## 2018-10-10 MED ORDER — LIDOCAINE HCL 1 % IJ SOLN
3.0000 mL | INTRAMUSCULAR | Status: AC | PRN
  Administered 2018-10-10: 3 mL

## 2018-10-10 NOTE — Progress Notes (Signed)
Office Visit Note   Patient: Aanvi Voyles           Date of Birth: 1941-02-01           MRN: [id number] Visit Date: 10/10/2018              Requested by: Lajean Manes, MD [address] [place], [place] [address] PCP: Lajean Manes, MD   Assessment & Plan: Visit Diagnoses:  1. Chronic right shoulder pain     Plan: She will work on range of motion including wall crawls, Codman exercises and pendulum exercises.  She will follow-up with [place] on as-needed basis pain persist or becomes worse.  Questions encouraged and answered.  Follow-Up Instructions: Return if symptoms worsen or fail to improve.   Orders:  Orders Placed This Encounter  Procedures  . Large Joint Inj: R subacromial bursa  . XR Shoulder Right   No orders of the defined types were placed in this encounter.     Procedures: No procedures performed   Clinical Data: No additional findings.   Subjective: Chief Complaint  Patient presents with  . Right Shoulder - Pain, Follow-up    HPI Ms. Renelda Loma comes in today with right shoulder pain for about a month.  No known injury.  Pains with worse with certain motions particularly external rotation.  Pain radiates down from the shoulder down the mid humerus.  No numbness tingling she describes it as a achy toothache-like pain.  She has a history of right shoulder impingement and has responded well to cortisone injections in the past. Review of Systems Please see HPI otherwise negative  Objective: Vital Signs: LMP 10/31/1991   Physical Exam Constitutional:      Appearance: Normal appearance. She is normal weight.  Pulmonary:     Effort: Pulmonary effort is normal.  Neurological:     Mental Status: She is alert.  Psychiatric:        Mood and Affect: Mood normal.     Ortho Exam Bilateral shoulders 5-5 strength with external and internal rotation against resistance negative empty can test bilaterally.  Liftoff test negative bilaterally.  Positive  impingement on the right negative on the left. Specialty Comments:  No specialty comments available.  Imaging: Xr Shoulder Right  Result Date: 10/10/2018 Right shoulder 3 views: Shoulders well located.  No acute fracture.  Cystic-like changes in the greater tuberosity region.  Glenohumeral joint appears well maintained.  Acromioclavicular joint with arthritic changes.    PMFS History: Patient Active Problem List   Diagnosis Date Noted  . Chronic pain of left knee 09/06/2017  . Unilateral primary osteoarthritis, left knee 09/06/2017  . Chronic pain of right knee 09/06/2017  . Leg length difference, acquired 03/06/2017  . Low back pain 03/06/2017  . Unilateral primary osteoarthritis, right knee 12/06/2016  . 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 Degenerative arthritis of hip 04/19/2012  . Anxiety   . Cancer ([place])   . Depression   . Arthritis   . RENAL CALCULUS, RIGHT 12/15/2008  . ABDOMINAL PAIN, UNSPECIFIED SITE 12/15/2008  . NEPHROLITHIASIS, HX OF 12/15/2008  . ANXIETY DEPRESSION 10/29/2007  . CONTACT DERMATITIS DUE TO SOLAR RADIATION 10/29/2007  . OSTEOARTHROSIS, LOCAL, PRIMARY, UNSPC SITE 08/16/2007   Past Medical History:  Diagnosis Date  . Anxiety   . Arthritis    lower back, hip  . Back pain    generally "causes severe stiffness"  . Complication of anesthesia    "felt it  made her feel very goofy for several months"  . Depression   . History of kidney stones    x1 episode  . 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 Osteopenia   . 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 Cancer Father        prostate  . Hypertension Father   . Heart attack Father   . Cancer Brother        Occupational psychologist  . Cancer Maternal Grandmother        throat  . Breast cancer Paternal Grandmother     Past Surgical History:  Procedure Laterality Date  . APPENDECTOMY  2010   open  . BREAST LUMPECTOMY  1961    . TOTAL HIP ARTHROPLASTY  04/19/2012   Procedure: TOTAL HIP ARTHROPLASTY ANTERIOR APPROACH;  Surgeon: Mcarthur Rossetti, MD;  Location: [facility];  Service: Orthopedics;  Laterality: Left;  Left Total Hip Arthroplasty  . TOTAL HIP ARTHROPLASTY Right 04/21/2016   Procedure: RIGHT TOTAL HIP ARTHROPLASTY ANTERIOR APPROACH;  Surgeon: Mcarthur Rossetti, MD;  Location: [facility];  Service: Orthopedics;  Laterality: Right;   Social History   Occupational History  . Not on file  Tobacco Use  . Smoking status: Former Smoker    Last attempt to quit: 04/15/1986    Years since quitting: 32.5  . Smokeless tobacco: Never Used  Substance and Sexual Activity  . Alcohol use: Yes    Alcohol/week: 7.0 standard drinks    Types: 7 Standard drinks or equivalent per week    Comment: 1 drink daily  . Drug use: No  . Sexual activity: Never

## 2018-10-10 NOTE — Progress Notes (Signed)
   Procedure Note  Patient: [name]             Date of Birth: 03/30/1941           MRN: [id number]             Visit Date: 10/10/2018  Procedures: Visit Diagnoses: Chronic right shoulder pain - Plan: XR Shoulder Right  Large Joint Inj: R subacromial bursa on 10/10/2018 4:54 PM Indications: pain Details: 22 G 1.5 in needle, superior approach  Arthrogram: No  Medications: 3 mL lidocaine 1 %; 40 mg methylPREDNISolone acetate 40 MG/ML Outcome: tolerated well, no immediate complications Procedure, treatment alternatives, risks and benefits explained, specific risks discussed. Consent was given by the patient. Immediately prior to procedure a time out was called to verify the correct patient, procedure, equipment, support staff and site/side marked as required. Patient was prepped and draped in the usual sterile fashion.

## 2018-10-10 NOTE — Telephone Encounter (Signed)
Patient was seen today by Artis Delay and she wants to know when her TKR is going to scheduled, she states that it is supposed to be scheduled for Feb. 2020.  She would like a call as to when this will be.

## 2018-10-30 HISTORY — PX: CATARACT EXTRACTION W/ INTRAOCULAR LENS IMPLANT: SHX1309

## 2018-11-05 ENCOUNTER — Ambulatory Visit
Admission: RE | Admit: 2018-11-05 | Discharge: 2018-11-05 | Disposition: A | Discharge status: Home / Self Care | Payer: Medicare HMO | Source: Ambulatory Visit | Attending: Geriatric Medicine | Admitting: Geriatric Medicine

## 2018-11-05 DIAGNOSIS — Z1231 Encounter for screening mammogram for malignant neoplasm of breast: Secondary | ICD-10-CM | POA: Diagnosis not present

## 2018-11-08 ENCOUNTER — Telehealth (INDEPENDENT_AMBULATORY_CARE_PROVIDER_SITE_OTHER): Payer: Self-pay | Admitting: Orthopaedic Surgery

## 2018-11-08 NOTE — Telephone Encounter (Signed)
LMOM for patient letting her know we returned her call

## 2018-11-08 NOTE — Telephone Encounter (Signed)
Patient called and stated that she would like someone from [facility] office to call her. No reason given.  Gave patient appt for 11/13/17 for knee pain

## 2018-11-11 ENCOUNTER — Encounter (INDEPENDENT_AMBULATORY_CARE_PROVIDER_SITE_OTHER): Payer: Self-pay | Admitting: Orthopaedic Surgery

## 2018-11-11 NOTE — Telephone Encounter (Signed)
I called patient on home and cell numbers and left voice mail for return call to discuss.

## 2018-11-12 DIAGNOSIS — R69 Illness, unspecified: Secondary | ICD-10-CM | POA: Diagnosis not present

## 2018-11-13 ENCOUNTER — Ambulatory Visit (INDEPENDENT_AMBULATORY_CARE_PROVIDER_SITE_OTHER): Payer: Medicare HMO | Admitting: Orthopaedic Surgery

## 2018-11-13 ENCOUNTER — Encounter (INDEPENDENT_AMBULATORY_CARE_PROVIDER_SITE_OTHER): Payer: Self-pay | Admitting: Orthopaedic Surgery

## 2018-11-13 DIAGNOSIS — M1711 Unilateral primary osteoarthritis, right knee: Secondary | ICD-10-CM

## 2018-11-13 NOTE — Progress Notes (Signed)
The patient is scheduled for a right total knee arthroplasty on February 21.  We have tried and failed all forms of conservative treatment for this knee.  She has known end-stage arthritis of the knee.  She is tried and failed activity modification as well as quad strengthening exercises.  She is had numerous steroid injections and hyaluronic acid injections in that knee.  She has had no acute change in her medical status.  She has had joint replacements before so she knows what it involves.  We had a long and thorough discussion about the intraoperative and postoperative course.  She would like to go to the skilled nursing facility [facility] postoperative.  On examination she has excellent range of motion of her right knee.  There is valgus malalignment but no instability on exam.  She started to feel worsening pain with pivoting activities.  I surmise she likely has chronic meniscal tearing as well.  She still dealing with shoulder issues with signs of impingement syndrome.  It flared up on her last night.  She understands she cannot have an injection today since she just had 1 a month ago.  We can address that at a later date.  All questions concerns were answered and addressed.  We will work on getting her scheduled for surgery and see her back in 2 weeks postoperative.

## 2018-11-21 ENCOUNTER — Encounter (INDEPENDENT_AMBULATORY_CARE_PROVIDER_SITE_OTHER): Payer: Self-pay | Admitting: Orthopaedic Surgery

## 2018-11-21 ENCOUNTER — Other Ambulatory Visit (INDEPENDENT_AMBULATORY_CARE_PROVIDER_SITE_OTHER): Payer: Self-pay | Admitting: Orthopaedic Surgery

## 2018-11-21 MED ORDER — ACETAMINOPHEN-CODEINE #3 300-30 MG PO TABS: 1.0000 | ORAL_TABLET | Freq: Three times a day (TID) | ORAL | 0 refills | Status: DC | PRN

## 2018-11-25 ENCOUNTER — Other Ambulatory Visit (INDEPENDENT_AMBULATORY_CARE_PROVIDER_SITE_OTHER): Payer: Self-pay

## 2018-12-10 ENCOUNTER — Other Ambulatory Visit (INDEPENDENT_AMBULATORY_CARE_PROVIDER_SITE_OTHER): Payer: Self-pay | Admitting: Physician Assistant

## 2018-12-12 ENCOUNTER — Other Ambulatory Visit (HOSPITAL_COMMUNITY): Payer: Self-pay | Admitting: *Deleted

## 2018-12-12 NOTE — Patient Instructions (Addendum)
[name]    Your procedure is scheduled on:  12-20-2018  Report to [facility] Main  Entrance  Report to admitting  at 715 AM    Call this number if you have problems the morning of surgery [phone]    Remember: Do not eat food or drink liquids :After Midnight. BRUSH YOUR TEETH MORNING OF SURGERY AND RINSE YOUR MOUTH OUT, NO CHEWING GUM CANDY OR MINTS.     Take these medicines the morning of surgery with A SIP OF WATER: alprazolam if needed                               You may not have any metal on your body including hair pins and              piercings  Do not wear jewelry, make-up, lotions, powders or perfumes, deodorant             Do not wear nail polish.  Do not shave  48 hours prior to surgery.              Men may shave face and neck.   Do not bring valuables to the hospital. [place].  Contacts, dentures or bridgework may not be worn into surgery.  Leave suitcase in the car. After surgery it may be brought to your room.     _____________________________________________________________________             [facility] - Preparing for Surgery Before surgery, you can play an important role.  Because skin is not sterile, your skin needs to be as free of germs as possible.  You can reduce the number of germs on your skin by washing with CHG (chlorahexidine gluconate) soap before surgery.  CHG is an antiseptic cleaner which kills germs and bonds with the skin to continue killing germs even after washing. Please DO NOT use if you have an allergy to CHG or antibacterial soaps.  If your skin becomes reddened/irritated stop using the CHG and inform your nurse when you arrive at Short Stay. Do not shave (including legs and underarms) for at least 48 hours prior to the first CHG shower.  You may shave your face/neck. Please follow these instructions carefully:  1.  Shower with CHG Soap the night before  surgery and the  morning of Surgery.  2.  If you choose to wash your hair, wash your hair first as usual with your  normal  shampoo.  3.  After you shampoo, rinse your hair and body thoroughly to remove the  shampoo.                           4.  Use CHG as you would any other liquid soap.  You can apply chg directly  to the skin and wash                       Gently with a scrungie or clean washcloth.  5.  Apply the CHG Soap to your body ONLY FROM THE NECK DOWN.   Do not use on face/ open  Wound or open sores. Avoid contact with eyes, ears mouth and genitals (private parts).                       Wash face,  Genitals (private parts) with your normal soap.             6.  Wash thoroughly, paying special attention to the area where your surgery  will be performed.  7.  Thoroughly rinse your body with warm water from the neck down.  8.  DO NOT shower/wash with your normal soap after using and rinsing off  the CHG Soap.                9.  Pat yourself dry with a clean towel.            10.  Wear clean pajamas.            11.  Place clean sheets on your bed the night of your first shower and do not  sleep with pets. Day of Surgery : Do not apply any lotions/deodorants the morning of surgery.  Please wear clean clothes to the hospital/surgery center.  FAILURE TO FOLLOW THESE INSTRUCTIONS MAY RESULT IN THE CANCELLATION OF YOUR SURGERY PATIENT SIGNATURE_________________________________  NURSE SIGNATURE__________________________________  ________________________________________________________________________   [name]  An incentive spirometer is a tool that can help keep your lungs clear and active. This tool measures how well you are filling your lungs with each breath. Taking long deep breaths may help reverse or decrease the chance of developing breathing (pulmonary) problems (especially infection) following:  A long period of time when you are unable to  move or be active. BEFORE THE PROCEDURE   If the spirometer includes an indicator to show your best effort, your nurse or respiratory therapist will set it to a desired goal.  If possible, sit up straight or lean slightly forward. Try not to slouch.  Hold the incentive spirometer in an upright position. INSTRUCTIONS FOR USE  1. Sit on the edge of your bed if possible, or sit up as far as you can in bed or on a chair. 2. Hold the incentive spirometer in an upright position. 3. Breathe out normally. 4. Place the mouthpiece in your mouth and seal your lips tightly around it. 5. Breathe in slowly and as deeply as possible, raising the piston or the ball toward the top of the column. 6. Hold your breath for 3-5 seconds or for as long as possible. Allow the piston or ball to fall to the bottom of the column. 7. Remove the mouthpiece from your mouth and breathe out normally. 8. Rest for a few seconds and repeat Steps 1 through 7 at least 10 times every 1-2 hours when you are awake. Take your time and take a few normal breaths between deep breaths. 9. The spirometer may include an indicator to show your best effort. Use the indicator as a goal to work toward during each repetition. 10. After each set of 10 deep breaths, practice coughing to be sure your lungs are clear. If you have an incision (the cut made at the time of surgery), support your incision when coughing by placing a pillow or rolled up towels firmly against it. Once you are able to get out of bed, walk around indoors and cough well. You may stop using the incentive spirometer when instructed by your caregiver.  RISKS AND COMPLICATIONS  Take your time so you do not get  dizzy or light-headed.  If you are in pain, you may need to take or ask for pain medication before doing incentive spirometry. It is harder to take a deep breath if you are having pain. AFTER USE  Rest and breathe slowly and easily.  It can be helpful to keep track of  a log of your progress. Your caregiver can provide you with a simple table to help with this. If you are using the spirometer at home, follow these instructions: [place] IF:   You are having difficultly using the spirometer.  You have trouble using the spirometer as often as instructed.  Your pain medication is not giving enough relief while using the spirometer.  You develop fever of 100.5 F (38.1 C) or higher. SEEK IMMEDIATE MEDICAL CARE IF:   You cough up bloody sputum that had not been present before.  You develop fever of 102 F (38.9 C) or greater.  You develop worsening pain at or near the incision site. MAKE SURE YOU:   Understand these instructions.  Will watch your condition.  Will get help right away if you are not doing well or get worse. Document Released: 02/26/2007 Document Revised: 01/08/2012 Document Reviewed: 04/29/2007 [employer] Patient Information 2014 [place].   ________________________________________________________________________   Incentive Spirometer  An incentive spirometer is a tool that can help keep your lungs clear and active. This tool measures how well you are filling your lungs with each breath. Taking long deep breaths may help reverse or decrease the chance of developing breathing (pulmonary) problems (especially infection) following:  A long period of time when you are unable to move or be active. BEFORE THE PROCEDURE   If the spirometer includes an indicator to show your best effort, your nurse or respiratory therapist will set it to a desired goal.  If possible, sit up straight or lean slightly forward. Try not to slouch.  Hold the incentive spirometer in an upright position. INSTRUCTIONS FOR USE  11. Sit on the edge of your bed if possible, or sit up as far as you can in bed or on a chair. 12. Hold the incentive spirometer in an upright position. 13. Breathe out normally. 14. Place the mouthpiece in your  mouth and seal your lips tightly around it. 15. Breathe in slowly and as deeply as possible, raising the piston or the ball toward the top of the column. 16. Hold your breath for 3-5 seconds or for as long as possible. Allow the piston or ball to fall to the bottom of the column. 17. Remove the mouthpiece from your mouth and breathe out normally. 18. Rest for a few seconds and repeat Steps 1 through 7 at least 10 times every 1-2 hours when you are awake. Take your time and take a few normal breaths between deep breaths. 19. The spirometer may include an indicator to show your best effort. Use the indicator as a goal to work toward during each repetition. 20. After each set of 10 deep breaths, practice coughing to be sure your lungs are clear. If you have an incision (the cut made at the time of surgery), support your incision when coughing by placing a pillow or rolled up towels firmly against it. Once you are able to get out of bed, walk around indoors and cough well. You may stop using the incentive spirometer when instructed by your caregiver.  RISKS AND COMPLICATIONS  Take your time so you do not get dizzy or light-headed.  If  you are in pain, you may need to take or ask for pain medication before doing incentive spirometry. It is harder to take a deep breath if you are having pain. AFTER USE  Rest and breathe slowly and easily.  It can be helpful to keep track of a log of your progress. Your caregiver can provide you with a simple table to help with this. If you are using the spirometer at home, follow these instructions: [place] IF:   You are having difficultly using the spirometer.  You have trouble using the spirometer as often as instructed.  Your pain medication is not giving enough relief while using the spirometer.  You develop fever of 100.5 F (38.1 C) or higher. SEEK IMMEDIATE MEDICAL CARE IF:   You cough up bloody sputum that had not been present  before.  You develop fever of 102 F (38.9 C) or greater.  You develop worsening pain at or near the incision site. MAKE SURE YOU:   Understand these instructions.  Will watch your condition.  Will get help right away if you are not doing well or get worse. Document Released: 02/26/2007 Document Revised: 01/08/2012 Document Reviewed: 04/29/2007 [facility] Patient Information 2014 [place], [place].   ________________________________________________________________________

## 2018-12-13 ENCOUNTER — Telehealth (INDEPENDENT_AMBULATORY_CARE_PROVIDER_SITE_OTHER): Payer: Self-pay | Admitting: Orthopaedic Surgery

## 2018-12-13 NOTE — Telephone Encounter (Signed)
Pt called wanting to know if he prescribed ice water machine for after surgery

## 2018-12-16 ENCOUNTER — Encounter (HOSPITAL_COMMUNITY)
Admission: RE | Admit: 2018-12-16 | Discharge: 2018-12-16 | Disposition: A | Discharge status: Home / Self Care | Payer: Medicare HMO | Source: Ambulatory Visit | Attending: Orthopaedic Surgery | Admitting: Orthopaedic Surgery

## 2018-12-16 ENCOUNTER — Other Ambulatory Visit: Payer: Self-pay

## 2018-12-16 ENCOUNTER — Encounter (HOSPITAL_COMMUNITY): Payer: Self-pay

## 2018-12-16 DIAGNOSIS — Z01818 Encounter for other preprocedural examination: Secondary | ICD-10-CM | POA: Diagnosis not present

## 2018-12-16 DIAGNOSIS — I10 Essential (primary) hypertension: Secondary | ICD-10-CM | POA: Insufficient documentation

## 2018-12-16 HISTORY — DX: Essential (primary) hypertension: I10

## 2018-12-16 HISTORY — DX: Gastro-esophageal reflux disease without esophagitis: K21.9

## 2018-12-16 LAB — BASIC METABOLIC PANEL
Anion gap: 6 (ref 5–15)
BUN: 16 mg/dL (ref 8–23)
CO2: 28 mmol/L (ref 22–32)
Calcium: 8.8 mg/dL — ABNORMAL LOW (ref 8.9–10.3)
Chloride: 103 mmol/L (ref 98–111)
Creatinine, Ser: 0.75 mg/dL (ref 0.44–1.00)
GFR calc Af Amer: >60 mL/min (ref >60)
Glucose, Bld: 97 mg/dL (ref 70–99)
Potassium: 3.9 mmol/L (ref 3.5–5.1)
Sodium: 137 mmol/L (ref 135–145)

## 2018-12-16 LAB — CBC
HCT: 38.1 % (ref 36.0–46.0)
Hemoglobin: 12.1 g/dL (ref 12.0–15.0)
MCH: 30.6 pg (ref 26.0–34.0)
MCHC: 31.8 g/dL (ref 30.0–36.0)
MCV: 96.2 fL (ref 80.0–100.0)
Platelets: 209 10*3/uL (ref 150–400)
RBC: 3.96 MIL/uL (ref 3.87–5.11)
RDW: 13.1 % (ref 11.5–15.5)
WBC: 6.6 10*3/uL (ref 4.0–10.5)
nRBC: 0 % (ref 0.0–0.2)

## 2018-12-16 LAB — SURGICAL PCR SCREEN
MRSA, PCR: NEGATIVE
Staphylococcus aureus: NEGATIVE

## 2018-12-16 NOTE — Telephone Encounter (Signed)
LMOM for patient of the below message  

## 2018-12-16 NOTE — Progress Notes (Signed)
PCP: HAL STONEKING  CARDIOLOGIST: NONE  INFO IN Epic:CBC 12-16-2018   INFO ON CHART:  BLOOD THINNERS AND LAST DOSES:NONE ____________________________________  PATIENT SYMPTOMS AT TIME OF PREOP: NONE

## 2018-12-16 NOTE — Telephone Encounter (Signed)
She will have to get that on her own unfortunately.

## 2018-12-20 ENCOUNTER — Other Ambulatory Visit: Payer: Self-pay

## 2018-12-20 ENCOUNTER — Ambulatory Visit (HOSPITAL_COMMUNITY): Payer: Medicare HMO | Admitting: Certified Registered"

## 2018-12-20 ENCOUNTER — Encounter (HOSPITAL_COMMUNITY): Payer: Self-pay | Admitting: *Deleted

## 2018-12-20 ENCOUNTER — Ambulatory Visit (HOSPITAL_COMMUNITY): Payer: Medicare HMO | Admitting: Physician Assistant

## 2018-12-20 ENCOUNTER — Observation Stay (HOSPITAL_COMMUNITY)
Admission: RE | Admit: 2018-12-20 | Discharge: 2018-12-25 | Disposition: A | Discharge status: Home / Self Care | Payer: Medicare HMO | Source: Other Acute Inpatient Hospital | Attending: Orthopaedic Surgery | Admitting: Orthopaedic Surgery

## 2018-12-20 ENCOUNTER — Observation Stay (HOSPITAL_COMMUNITY): Payer: Medicare HMO

## 2018-12-20 ENCOUNTER — Encounter (HOSPITAL_COMMUNITY)
Admission: RE | Disposition: A | Discharge status: Home / Self Care | Payer: Self-pay | Source: Other Acute Inpatient Hospital | Attending: Orthopaedic Surgery

## 2018-12-20 DIAGNOSIS — F419 Anxiety disorder, unspecified: Secondary | ICD-10-CM | POA: Insufficient documentation

## 2018-12-20 DIAGNOSIS — Z96651 Presence of right artificial knee joint: Secondary | ICD-10-CM

## 2018-12-20 DIAGNOSIS — I10 Essential (primary) hypertension: Secondary | ICD-10-CM | POA: Insufficient documentation

## 2018-12-20 DIAGNOSIS — E669 Obesity, unspecified: Secondary | ICD-10-CM | POA: Diagnosis not present

## 2018-12-20 DIAGNOSIS — K219 Gastro-esophageal reflux disease without esophagitis: Secondary | ICD-10-CM | POA: Diagnosis not present

## 2018-12-20 DIAGNOSIS — Z96643 Presence of artificial hip joint, bilateral: Secondary | ICD-10-CM | POA: Insufficient documentation

## 2018-12-20 DIAGNOSIS — M2170 Unequal limb length (acquired), unspecified site: Secondary | ICD-10-CM | POA: Diagnosis not present

## 2018-12-20 DIAGNOSIS — Z87891 Personal history of nicotine dependence: Secondary | ICD-10-CM | POA: Diagnosis not present

## 2018-12-20 DIAGNOSIS — M8580 Other specified disorders of bone density and structure, unspecified site: Secondary | ICD-10-CM | POA: Diagnosis not present

## 2018-12-20 DIAGNOSIS — F329 Major depressive disorder, single episode, unspecified: Secondary | ICD-10-CM | POA: Diagnosis not present

## 2018-12-20 DIAGNOSIS — M1711 Unilateral primary osteoarthritis, right knee: Secondary | ICD-10-CM | POA: Diagnosis not present

## 2018-12-20 DIAGNOSIS — Z79899 Other long term (current) drug therapy: Secondary | ICD-10-CM | POA: Diagnosis not present

## 2018-12-20 DIAGNOSIS — Z791 Long term (current) use of non-steroidal anti-inflammatories (NSAID): Secondary | ICD-10-CM | POA: Diagnosis not present

## 2018-12-20 DIAGNOSIS — I2510 Atherosclerotic heart disease of native coronary artery without angina pectoris: Secondary | ICD-10-CM | POA: Diagnosis not present

## 2018-12-20 DIAGNOSIS — G8918 Other acute postprocedural pain: Secondary | ICD-10-CM | POA: Diagnosis not present

## 2018-12-20 DIAGNOSIS — Z471 Aftercare following joint replacement surgery: Secondary | ICD-10-CM | POA: Diagnosis not present

## 2018-12-20 DIAGNOSIS — Z7982 Long term (current) use of aspirin: Secondary | ICD-10-CM | POA: Diagnosis not present

## 2018-12-20 DIAGNOSIS — M21061 Valgus deformity, not elsewhere classified, right knee: Secondary | ICD-10-CM | POA: Diagnosis not present

## 2018-12-20 DIAGNOSIS — Z85828 Personal history of other malignant neoplasm of skin: Secondary | ICD-10-CM | POA: Insufficient documentation

## 2018-12-20 DIAGNOSIS — R69 Illness, unspecified: Secondary | ICD-10-CM | POA: Diagnosis not present

## 2018-12-20 DIAGNOSIS — Z6825 Body mass index (BMI) 25.0-25.9, adult: Secondary | ICD-10-CM | POA: Diagnosis not present

## 2018-12-20 HISTORY — PX: TOTAL KNEE ARTHROPLASTY: SHX125

## 2018-12-20 SURGERY — ARTHROPLASTY, KNEE, TOTAL
Anesthesia: Spinal | Site: Knee | Laterality: Right

## 2018-12-20 MED ORDER — EPHEDRINE SULFATE-NACL 50-0.9 MG/10ML-% IV SOSY
PREFILLED_SYRINGE | INTRAVENOUS | Status: DC | PRN
  Administered 2018-12-20: 10 mg via INTRAVENOUS
  Administered 2018-12-20: 5 mg via INTRAVENOUS

## 2018-12-20 MED ORDER — BUPIVACAINE-EPINEPHRINE (PF) 0.5% -1:200000 IJ SOLN
INTRAMUSCULAR | Status: DC | PRN
  Administered 2018-12-20: 30 mL

## 2018-12-20 MED ORDER — MIDAZOLAM HCL 2 MG/2ML IJ SOLN
INTRAMUSCULAR | Status: AC
  Administered 2018-12-20: 1 mg via INTRAVENOUS
  Filled 2018-12-20: qty 2

## 2018-12-20 MED ORDER — PROPOFOL 10 MG/ML IV BOLUS
INTRAVENOUS | Status: AC
  Filled 2018-12-20: qty 20

## 2018-12-20 MED ORDER — ROCURONIUM BROMIDE 100 MG/10ML IV SOLN
INTRAVENOUS | Status: AC
  Filled 2018-12-20: qty 2

## 2018-12-20 MED ORDER — POLYETHYLENE GLYCOL 3350 17 G PO PACK
17.0000 g | PACK | Freq: Every day | ORAL | Status: DC | PRN
  Filled 2018-12-20: qty 1

## 2018-12-20 MED ORDER — LIP MEDEX EX OINT
TOPICAL_OINTMENT | CUTANEOUS | Status: AC
  Filled 2018-12-20: qty 7

## 2018-12-20 MED ORDER — DOCUSATE SODIUM 100 MG PO CAPS
100.0000 mg | ORAL_CAPSULE | Freq: Two times a day (BID) | ORAL | Status: DC
  Administered 2018-12-20 – 2018-12-25 (×10): 100 mg via ORAL
  Filled 2018-12-20 (×10): qty 1

## 2018-12-20 MED ORDER — CEFAZOLIN SODIUM-DEXTROSE 2-4 GM/100ML-% IV SOLN
2.0000 g | INTRAVENOUS | Status: AC
  Administered 2018-12-20: 2 g via INTRAVENOUS
  Filled 2018-12-20: qty 100

## 2018-12-20 MED ORDER — SODIUM CHLORIDE 0.9 % IR SOLN
Status: DC | PRN
  Administered 2018-12-20: 3000 mL

## 2018-12-20 MED ORDER — ONDANSETRON HCL 4 MG PO TABS: 4.0000 mg | ORAL_TABLET | Freq: Four times a day (QID) | ORAL | Status: DC | PRN

## 2018-12-20 MED ORDER — MENTHOL 3 MG MT LOZG
1.0000 | LOZENGE | OROMUCOSAL | Status: DC | PRN
  Administered 2018-12-21: 3 mg via ORAL
  Filled 2018-12-20: qty 9

## 2018-12-20 MED ORDER — ATORVASTATIN CALCIUM 10 MG PO TABS
10.0000 mg | ORAL_TABLET | Freq: Every day | ORAL | Status: DC
  Administered 2018-12-20 – 2018-12-24 (×5): 10 mg via ORAL
  Filled 2018-12-20 (×5): qty 1

## 2018-12-20 MED ORDER — CHLORHEXIDINE GLUCONATE 4 % EX LIQD: 60.0000 mL | Freq: Once | CUTANEOUS | Status: DC

## 2018-12-20 MED ORDER — EPHEDRINE 5 MG/ML INJ
INTRAVENOUS | Status: AC
  Filled 2018-12-20: qty 10

## 2018-12-20 MED ORDER — PHENYLEPHRINE 40 MCG/ML (10ML) SYRINGE FOR IV PUSH (FOR BLOOD PRESSURE SUPPORT)
PREFILLED_SYRINGE | INTRAVENOUS | Status: AC
  Filled 2018-12-20: qty 10

## 2018-12-20 MED ORDER — FENTANYL CITRATE (PF) 100 MCG/2ML IJ SOLN: 25.0000 ug | INTRAMUSCULAR | Status: DC | PRN

## 2018-12-20 MED ORDER — ONDANSETRON HCL 4 MG/2ML IJ SOLN
INTRAMUSCULAR | Status: DC | PRN
  Administered 2018-12-20: 4 mg via INTRAVENOUS

## 2018-12-20 MED ORDER — DIPHENHYDRAMINE HCL 12.5 MG/5ML PO ELIX: 12.5000 mg | ORAL_SOLUTION | ORAL | Status: DC | PRN

## 2018-12-20 MED ORDER — FENTANYL CITRATE (PF) 100 MCG/2ML IJ SOLN
INTRAMUSCULAR | Status: DC | PRN
  Administered 2018-12-20: 25 ug via INTRAVENOUS
  Administered 2018-12-20: 75 ug via INTRAVENOUS

## 2018-12-20 MED ORDER — SODIUM CHLORIDE 0.9 % IV SOLN
INTRAVENOUS | Status: DC
  Administered 2018-12-20: 75 mL/h via INTRAVENOUS

## 2018-12-20 MED ORDER — ONDANSETRON HCL 4 MG/2ML IJ SOLN: 4.0000 mg | Freq: Once | INTRAMUSCULAR | Status: DC | PRN

## 2018-12-20 MED ORDER — DEXAMETHASONE SODIUM PHOSPHATE 10 MG/ML IJ SOLN
INTRAMUSCULAR | Status: DC | PRN
  Administered 2018-12-20: 10 mg via INTRAVENOUS

## 2018-12-20 MED ORDER — FENTANYL CITRATE (PF) 100 MCG/2ML IJ SOLN
50.0000 ug | INTRAMUSCULAR | Status: DC
  Administered 2018-12-20: 50 ug via INTRAVENOUS
  Filled 2018-12-20: qty 2

## 2018-12-20 MED ORDER — STERILE WATER FOR IRRIGATION IR SOLN
Status: DC | PRN
  Administered 2018-12-20: 2000 mL

## 2018-12-20 MED ORDER — PROPOFOL 10 MG/ML IV BOLUS
INTRAVENOUS | Status: DC | PRN
  Administered 2018-12-20: 30 mg via INTRAVENOUS

## 2018-12-20 MED ORDER — DEXAMETHASONE SODIUM PHOSPHATE 10 MG/ML IJ SOLN
INTRAMUSCULAR | Status: AC
  Filled 2018-12-20: qty 2

## 2018-12-20 MED ORDER — PHENOL 1.4 % MT LIQD
1.0000 | OROMUCOSAL | Status: DC | PRN
  Filled 2018-12-20: qty 177

## 2018-12-20 MED ORDER — ACETAMINOPHEN 325 MG PO TABS
325.0000 mg | ORAL_TABLET | Freq: Four times a day (QID) | ORAL | Status: DC | PRN
  Administered 2018-12-20: 1000 mg via ORAL
  Filled 2018-12-20: qty 2

## 2018-12-20 MED ORDER — METOCLOPRAMIDE HCL 5 MG PO TABS: 5.0000 mg | ORAL_TABLET | Freq: Three times a day (TID) | ORAL | Status: DC | PRN

## 2018-12-20 MED ORDER — BUPIVACAINE-EPINEPHRINE (PF) 0.5% -1:200000 IJ SOLN
INTRAMUSCULAR | Status: AC
  Filled 2018-12-20: qty 30

## 2018-12-20 MED ORDER — CEFAZOLIN SODIUM-DEXTROSE 1-4 GM/50ML-% IV SOLN
1.0000 g | Freq: Four times a day (QID) | INTRAVENOUS | Status: AC
  Administered 2018-12-20 (×2): 1 g via INTRAVENOUS
  Filled 2018-12-20 (×2): qty 50

## 2018-12-20 MED ORDER — MIDAZOLAM HCL 2 MG/2ML IJ SOLN
INTRAMUSCULAR | Status: DC | PRN
  Administered 2018-12-20 (×4): 0.5 mg via INTRAVENOUS

## 2018-12-20 MED ORDER — BUPIVACAINE IN DEXTROSE 0.75-8.25 % IT SOLN
INTRATHECAL | Status: DC | PRN
  Administered 2018-12-20: 1.6 mL via INTRATHECAL

## 2018-12-20 MED ORDER — SUGAMMADEX SODIUM 500 MG/5ML IV SOLN
INTRAVENOUS | Status: AC
  Filled 2018-12-20: qty 5

## 2018-12-20 MED ORDER — SODIUM CHLORIDE 0.9 % IR SOLN
Status: DC | PRN
  Administered 2018-12-20: 1000 mL

## 2018-12-20 MED ORDER — HYDROMORPHONE HCL 1 MG/ML IJ SOLN
0.5000 mg | INTRAMUSCULAR | Status: DC | PRN
  Administered 2018-12-20 – 2018-12-21 (×3): 0.5 mg via INTRAVENOUS
  Filled 2018-12-20 (×4): qty 1

## 2018-12-20 MED ORDER — LACTATED RINGERS IV SOLN
INTRAVENOUS | Status: DC
  Administered 2018-12-20 (×2): via INTRAVENOUS

## 2018-12-20 MED ORDER — SUCCINYLCHOLINE CHLORIDE 200 MG/10ML IV SOSY
PREFILLED_SYRINGE | INTRAVENOUS | Status: AC
  Filled 2018-12-20: qty 10

## 2018-12-20 MED ORDER — HYDROCHLOROTHIAZIDE 25 MG PO TABS
12.5000 mg | ORAL_TABLET | Freq: Every morning | ORAL | Status: DC
  Administered 2018-12-20 – 2018-12-25 (×6): 12.5 mg via ORAL
  Filled 2018-12-20 (×6): qty 1

## 2018-12-20 MED ORDER — MIDAZOLAM HCL 2 MG/2ML IJ SOLN
1.0000 mg | INTRAMUSCULAR | Status: DC
  Administered 2018-12-20: 1 mg via INTRAVENOUS

## 2018-12-20 MED ORDER — METHOCARBAMOL 500 MG IVPB - SIMPLE MED
500.0000 mg | Freq: Four times a day (QID) | INTRAVENOUS | Status: DC | PRN
  Filled 2018-12-20: qty 50

## 2018-12-20 MED ORDER — ONDANSETRON HCL 4 MG/2ML IJ SOLN: 4.0000 mg | Freq: Four times a day (QID) | INTRAMUSCULAR | Status: DC | PRN

## 2018-12-20 MED ORDER — PROPOFOL 500 MG/50ML IV EMUL
INTRAVENOUS | Status: DC | PRN
  Administered 2018-12-20: 50 ug/kg/min via INTRAVENOUS

## 2018-12-20 MED ORDER — METHOCARBAMOL 500 MG PO TABS
500.0000 mg | ORAL_TABLET | Freq: Four times a day (QID) | ORAL | Status: DC | PRN
  Administered 2018-12-20 – 2018-12-24 (×7): 500 mg via ORAL
  Filled 2018-12-20 (×7): qty 1

## 2018-12-20 MED ORDER — OXYCODONE HCL 5 MG PO TABS
10.0000 mg | ORAL_TABLET | ORAL | Status: DC | PRN
  Administered 2018-12-21 (×2): 10 mg via ORAL
  Filled 2018-12-20: qty 2

## 2018-12-20 MED ORDER — METOCLOPRAMIDE HCL 5 MG/ML IJ SOLN: 5.0000 mg | Freq: Three times a day (TID) | INTRAMUSCULAR | Status: DC | PRN

## 2018-12-20 MED ORDER — ACETAMINOPHEN 500 MG PO TABS
ORAL_TABLET | ORAL | Status: AC
  Administered 2018-12-20: 1000 mg via ORAL
  Filled 2018-12-20: qty 2

## 2018-12-20 MED ORDER — MIDAZOLAM HCL 2 MG/2ML IJ SOLN
INTRAMUSCULAR | Status: AC
  Filled 2018-12-20: qty 2

## 2018-12-20 MED ORDER — ROPIVACAINE HCL 7.5 MG/ML IJ SOLN
INTRAMUSCULAR | Status: DC | PRN
  Administered 2018-12-20: 20 mL via PERINEURAL

## 2018-12-20 MED ORDER — ALPRAZOLAM 0.25 MG PO TABS
0.1250 mg | ORAL_TABLET | ORAL | Status: DC | PRN
  Administered 2018-12-20 – 2018-12-21 (×2): 0.125 mg via ORAL
  Filled 2018-12-20 (×3): qty 1

## 2018-12-20 MED ORDER — ADULT MULTIVITAMIN W/MINERALS CH
1.0000 | ORAL_TABLET | Freq: Every day | ORAL | Status: DC
  Administered 2018-12-21 – 2018-12-25 (×5): 1 via ORAL
  Filled 2018-12-20 (×6): qty 1

## 2018-12-20 MED ORDER — LIDOCAINE 2% (20 MG/ML) 5 ML SYRINGE
INTRAMUSCULAR | Status: DC | PRN
  Administered 2018-12-20 (×2): 40 mg via INTRAVENOUS

## 2018-12-20 MED ORDER — PHENYLEPHRINE 40 MCG/ML (10ML) SYRINGE FOR IV PUSH (FOR BLOOD PRESSURE SUPPORT)
PREFILLED_SYRINGE | INTRAVENOUS | Status: DC | PRN
  Administered 2018-12-20: 120 ug via INTRAVENOUS

## 2018-12-20 MED ORDER — PANTOPRAZOLE SODIUM 40 MG PO TBEC
40.0000 mg | DELAYED_RELEASE_TABLET | Freq: Every day | ORAL | Status: DC
  Administered 2018-12-21 – 2018-12-25 (×5): 40 mg via ORAL
  Filled 2018-12-20 (×6): qty 1

## 2018-12-20 MED ORDER — OXYCODONE HCL 5 MG PO TABS
5.0000 mg | ORAL_TABLET | ORAL | Status: DC | PRN
  Administered 2018-12-20: 10 mg via ORAL
  Administered 2018-12-20 (×2): 5 mg via ORAL
  Filled 2018-12-20: qty 1
  Filled 2018-12-20: qty 2
  Filled 2018-12-20 (×3): qty 1

## 2018-12-20 MED ORDER — ALUM & MAG HYDROXIDE-SIMETH 200-200-20 MG/5ML PO SUSP: 30.0000 mL | ORAL | Status: DC | PRN

## 2018-12-20 MED ORDER — PROPOFOL 10 MG/ML IV BOLUS
INTRAVENOUS | Status: AC
  Filled 2018-12-20: qty 40

## 2018-12-20 MED ORDER — TRANEXAMIC ACID-NACL 1000-0.7 MG/100ML-% IV SOLN
1000.0000 mg | INTRAVENOUS | Status: AC
  Administered 2018-12-20: 1000 mg via INTRAVENOUS
  Filled 2018-12-20: qty 100

## 2018-12-20 MED ORDER — FENTANYL CITRATE (PF) 100 MCG/2ML IJ SOLN
INTRAMUSCULAR | Status: AC
  Filled 2018-12-20: qty 2

## 2018-12-20 MED ORDER — ASPIRIN 81 MG PO CHEW
81.0000 mg | CHEWABLE_TABLET | Freq: Two times a day (BID) | ORAL | Status: DC
  Administered 2018-12-20 – 2018-12-25 (×10): 81 mg via ORAL
  Filled 2018-12-20 (×10): qty 1

## 2018-12-20 MED ORDER — ONDANSETRON HCL 4 MG/2ML IJ SOLN
INTRAMUSCULAR | Status: AC
  Filled 2018-12-20: qty 4

## 2018-12-20 MED ORDER — CITALOPRAM HYDROBROMIDE 20 MG PO TABS
20.0000 mg | ORAL_TABLET | Freq: Every day | ORAL | Status: DC
  Administered 2018-12-20 – 2018-12-24 (×5): 20 mg via ORAL
  Filled 2018-12-20 (×5): qty 1

## 2018-12-20 MED ORDER — LIDOCAINE 2% (20 MG/ML) 5 ML SYRINGE
INTRAMUSCULAR | Status: AC
  Filled 2018-12-20: qty 10

## 2018-12-20 SURGICAL SUPPLY — 62 items
BAG ZIPLOCK 12X15 (MISCELLANEOUS) IMPLANT
BANDAGE ACE 6X5 VEL STRL LF (GAUZE/BANDAGES/DRESSINGS) ×3 IMPLANT
BASEPLATE TIBIAL TRIATHALON 3 (Plate) ×3 IMPLANT
BENZOIN TINCTURE PRP APPL 2/3 (GAUZE/BANDAGES/DRESSINGS) IMPLANT
BLADE SAG 18X100X1.27 (BLADE) IMPLANT
BLADE SURG SZ10 CARB STEEL (BLADE) ×6 IMPLANT
BOWL SMART MIX CTS (DISPOSABLE) ×3 IMPLANT
CEMENT BONE SIMPLEX SPEEDSET (Cement) ×3 IMPLANT
CHLORAPREP W/TINT 26ML (MISCELLANEOUS) ×3 IMPLANT
CLOSURE WOUND 1/2 X4 (GAUZE/BANDAGES/DRESSINGS)
COVER SURGICAL LIGHT HANDLE (MISCELLANEOUS) ×3 IMPLANT
COVER WAND RF STERILE (DRAPES) IMPLANT
CUFF TOURN SGL QUICK 34 (TOURNIQUET CUFF) ×2
CUFF TRNQT CYL 34X4.125X (TOURNIQUET CUFF) ×1 IMPLANT
DECANTER SPIKE VIAL GLASS SM (MISCELLANEOUS) IMPLANT
DRAPE U-SHAPE 47X51 STRL (DRAPES) ×3 IMPLANT
DRSG PAD ABDOMINAL 8X10 ST (GAUZE/BANDAGES/DRESSINGS) ×3 IMPLANT
DURAPREP 26ML APPLICATOR (WOUND CARE) IMPLANT
ELECT REM PT RETURN 15FT ADLT (MISCELLANEOUS) ×3 IMPLANT
FEMORAL PEG DISTAL FIXATION (Orthopedic Implant) ×3 IMPLANT
FEMORAL TRIATH POST STAB SZ 4 (Orthopedic Implant) ×3 IMPLANT
GAUZE SPONGE 4X4 12PLY STRL (GAUZE/BANDAGES/DRESSINGS) ×3 IMPLANT
GAUZE XEROFORM 1X8 LF (GAUZE/BANDAGES/DRESSINGS) ×3 IMPLANT
GLOVE BIO SURGEON STRL SZ7.5 (GLOVE) ×3 IMPLANT
GLOVE BIOGEL PI IND STRL 6.5 (GLOVE) ×1 IMPLANT
GLOVE BIOGEL PI IND STRL 7.5 (GLOVE) ×3 IMPLANT
GLOVE BIOGEL PI IND STRL 8 (GLOVE) ×2 IMPLANT
GLOVE BIOGEL PI INDICATOR 6.5 (GLOVE) ×2
GLOVE BIOGEL PI INDICATOR 7.5 (GLOVE) ×6
GLOVE BIOGEL PI INDICATOR 8 (GLOVE) ×4
GLOVE ECLIPSE 8.0 STRL XLNG CF (GLOVE) ×3 IMPLANT
GLOVE SURG SS PI 6.5 STRL IVOR (GLOVE) ×3 IMPLANT
GOWN SPEC L4 XLG W/TWL (GOWN DISPOSABLE) ×3 IMPLANT
GOWN STRL REIN 2XL XLG LVL4 (GOWN DISPOSABLE) ×3 IMPLANT
GOWN STRL REUS W/TWL XL LVL3 (GOWN DISPOSABLE) ×6 IMPLANT
HANDPIECE INTERPULSE COAX TIP (DISPOSABLE) ×2
HOLDER FOLEY CATH W/STRAP (MISCELLANEOUS) IMPLANT
IMMOBILIZER KNEE 20 (SOFTGOODS) ×3
IMMOBILIZER KNEE 20 THIGH 36 (SOFTGOODS) ×1 IMPLANT
INSERT PS TRIATH X3 #3 10 (Insert) ×3 IMPLANT
NDL SAFETY ECLIPSE 18X1.5 (NEEDLE) IMPLANT
NEEDLE HYPO 18GX1.5 SHARP (NEEDLE)
NS IRRIG 1000ML POUR BTL (IV SOLUTION) ×3 IMPLANT
PACK TOTAL KNEE CUSTOM (KITS) ×3 IMPLANT
PADDING CAST COTTON 6X4 STRL (CAST SUPPLIES) ×6 IMPLANT
PATELLA TRIATHLON SZ 29 9 MM (Orthopedic Implant) ×3 IMPLANT
PROTECTOR NERVE ULNAR (MISCELLANEOUS) ×3 IMPLANT
SET HNDPC FAN SPRY TIP SCT (DISPOSABLE) ×1 IMPLANT
SET PAD KNEE POSITIONER (MISCELLANEOUS) ×3 IMPLANT
STAPLER VISISTAT 35W (STAPLE) IMPLANT
STRIP CLOSURE SKIN 1/2X4 (GAUZE/BANDAGES/DRESSINGS) IMPLANT
SUT MNCRL AB 4-0 PS2 18 (SUTURE) IMPLANT
SUT VIC AB 0 CT1 27 (SUTURE) ×2
SUT VIC AB 0 CT1 27XBRD ANTBC (SUTURE) ×1 IMPLANT
SUT VIC AB 1 CT1 36 (SUTURE) ×6 IMPLANT
SUT VIC AB 2-0 CT1 27 (SUTURE) ×4
SUT VIC AB 2-0 CT1 TAPERPNT 27 (SUTURE) ×2 IMPLANT
SYR 3ML LL SCALE MARK (SYRINGE) IMPLANT
TRAY FOLEY MTR SLVR 16FR STAT (SET/KITS/TRAYS/PACK) IMPLANT
WATER STERILE IRR 1000ML POUR (IV SOLUTION) ×3 IMPLANT
WRAP KNEE MAXI GEL POST OP (GAUZE/BANDAGES/DRESSINGS) ×3 IMPLANT
YANKAUER SUCT BULB TIP 10FT TU (MISCELLANEOUS) ×3 IMPLANT

## 2018-12-20 NOTE — Anesthesia Procedure Notes (Signed)
Spinal  Patient location during procedure: OR End time: 12/20/2018 10:18 AM Staffing Resident/CRNA: Cynda Familia, CRNA Performed: resident/CRNA  Preanesthetic Checklist Completed: patient identified, site marked, surgical consent, pre-op evaluation, timeout performed, IV checked, risks and benefits discussed and monitors and equipment checked Spinal Block Patient position: sitting Prep: ChloraPrep and site prepped and draped Patient monitoring: heart rate, continuous pulse ox, blood pressure and cardiac monitor Approach: midline Location: L3-4 Injection technique: single-shot Needle Needle type: Sprotte  Needle gauge: 24 G Additional Notes Expiration date of tray noted and within date.   Patient tolerated procedure well. Dr Gifford Shave assisted and supervised-- introducer placed  L3 - L4 and withdrawn slightly due to concern of CSF as per Gifford Shave -- needle placed via introducer with no CSF return-- introducer withdrawn and replaced lower with introducer/needle placed with good CSF return-- prep dry at time of placement.

## 2018-12-20 NOTE — Anesthesia Procedure Notes (Signed)
Procedure Name: MAC Date/Time: 12/20/2018 10:05 AM Performed by: Cynda Familia, CRNA Pre-anesthesia Checklist: Emergency Drugs available, Suction available, Patient being monitored, Timeout performed and Patient identified Patient Re-evaluated:Patient Re-evaluated prior to induction Oxygen Delivery Method: Simple face mask Placement Confirmation: positive ETCO2 and breath sounds checked- equal and bilateral Dental Injury: Teeth and Oropharynx as per pre-operative assessment

## 2018-12-20 NOTE — Op Note (Signed)
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  PROCEDURE:  Right total knee arthroplasty.  IMPLANTS:  Stryker Triathlon cemented knee system with size 3 tibia tray, size 4 femur, 10 mm fixed-bearing polyethylene insert, size 29 cemented patellar button.  SURGEON:  Lind Guest. Ninfa Linden, MD  ASSISTANT:  Erskine Emery, PA-C  ANESTHESIA: 1.  Regional right lower extremity adductor canal block. 2.  Spinal. 3.  Local with 0.25% Marcaine.  TOURNIQUET TIME:  Under 1 hour.  ANTIBIOTICS:  Two grams IV Ancef.  ESTIMATED BLOOD LOSS:  100 mL.  COMPLICATIONS:  None.  INDICATIONS:  The patient is an 78 year old female well known to me.  I have seen her for many years now, and I have actually performed a total hip arthroplasty on her.  Her right knee has had debilitating arthritis for some now.  It has been worsening  over 5 years.  She has tried and failed all forms of conservative treatment including activity modification and quad strengthening exercises.  She is a very thin individual.  She is using an assistive device to offload her knee.  She has tried multiple  steroid and hyaluronic acid injections.  At this point, it has gotten to be where her pain is daily, and it has detrimentally affected her mobility, her quality of life, and her activities of daily living, to the point she does wish to proceed with total  knee arthroplasty.  We had a long and thorough discussion about this in the office.  We talked about the risk of acute blood loss anemia, nerve or vessel injury, fracture,  infection, DVT, and implant failure.  We talked about the goals of being  decreased pain, improved mobility and overall improved quality of life.  DESCRIPTION OF PROCEDURE:  After informed consent was obtained and appropriate right knee was blocked, anesthesia obtained, and an adductor canal block in the right lower extremity in the holding room, she was brought to the operating room and sat up on  the operating table.  Spinal anesthesia was then obtained.  She was then laid in supine position.  A Foley catheter was placed, and a nonsterile tourniquet was placed around her upper right thigh.  Her right thigh, knee, leg and ankle were prepped and  draped with DuraPrep and sterile drapes including a sterile stockinette.  Time-out was called, and she was identified as correct patient, correct right knee.  We then used an Esmarch to wrap that leg, and tourniquet was inflated to 300 mm of pressure.  I  then made a direct midline incision over the knee and carried this proximally and distally.  We dissected down the knee joint and carried out a medial parapatellar arthrotomy, finding significant synovitis in the knee and a large effusion.  With the  knee in a flexed position, we removed remnants of ACL, PCL, medial and lateral meniscus.  We found complete loss of cartilage over the lateral compartment of her knee.  We set our proximal tibia cutting guide, which was an extramedullary guide,  correcting for varus and valgus and neutral slope.  We made this to take 9 mm off the high side.  We took this without difficulty, and then I decided to  take 2 more millimeters off of this based on what our cut looked like.  We then used an  intramedullary drill for our distal femoral cutting guide, setting this for a right knee at 3 degrees externally rotated since it was a valgus knee and a 10 mm distal femoral cut given her flexion contracture.  We made this cut without difficulty, and I  then impacted down 2 more  millimeters.  With the knee in full extended position, a 9 mm extension block did give her full extension.  We then went back to the femur and put our femoral sizing guide based off the epicondylar axis and [place] line.  With  this being said, I chose a size 4 femur.  We made our anterior and posterior cuts for a size 4 femur.  After putting our 4-in-1 cutting block, we also did our chamfer cuts.  We then did our femoral box cut as well for a size 4 femur.  Attention was then  turned back to the tibia.  We chose a size 3 tibial tray for coverage, setting the rotation off the tibial tubercle and the femur.  We made our keel punch off of this.  With the size 3 trial tibia followed by the 4 right femur, we trialed a 9 and 10 mm  fixed-bearing polyethylene insert, and we were pleased with the stability of the 10 mm insert.  We then made our patellar cut and drilled 3 holes for size 29 patellar button.  I then removed all instrumentation from the knee.  We irrigated the knee with  normal saline solution using pulsatile lavage.  I then injected the arthrotomy with Marcaine around the arthrotomy.  We then dried the knee really well with the knee in a flexed position.  We mixed our cement and then cemented the real Stryker Triathlon  tibial tray size 3 followed by the real size 4 right femur.  We placed our real fixed-bearing polyethylene insert size 10 and cemented our patellar button.  As the cement was hardening, we removed debris that we could from the knee.  Once it hardened  completely, we let the tourniquet down.  Hemostasis was obtained with electrocautery.  We closed the arthrotomy with interrupted #1 Vicryl suture followed by 0 Vicryl to the deep tissue, 2-0 Vicryl subcutaneous tissue and interrupted staples on the skin.   Xeroform and an Aquacel dressing were applied.  She was taken to recovery room in stable condition.  All final counts were correct.  There were no complications noted.  Anesthesia did  let me know that she has a small run of SVT while she was in the  operating room, but there were no other concerns.  LN/NUANCE  D:12/20/2018 T:12/20/2018 JOB:[id number]

## 2018-12-20 NOTE — Transfer of Care (Signed)
Immediate Anesthesia Transfer of Care Note  Patient: [name]  Procedure(s) Performed: RIGHT TOTAL KNEE ARTHROPLASTY (Right Knee)  Patient Location: PACU  Anesthesia Type:Spinal  Level of Consciousness: awake and alert   Airway & Oxygen Therapy: Patient Spontanous Breathing and Patient connected to face mask oxygen  Post-op Assessment: Report given to RN and Post -op Vital signs reviewed and stable  Post vital signs: Reviewed and stable  Last Vitals:  Vitals Value Taken Time  BP 114/61 12/20/2018 12:15 PM  Temp    Pulse 77 12/20/2018 12:15 PM  Resp 15 12/20/2018 12:15 PM  SpO2 99 % 12/20/2018 12:15 PM  Vitals shown include unvalidated device data.  Last Pain:  Vitals:   12/20/18 0801  TempSrc:   PainSc: 3       Patients Stated Pain Goal: 4 (28/90/22 8406)  Complications: No apparent anesthesia complications

## 2018-12-20 NOTE — Anesthesia Procedure Notes (Signed)
Anesthesia Regional Block: Adductor canal block   Pre-Anesthetic Checklist: ,, timeout performed, Correct Patient, Correct Site, Correct Laterality, Correct Procedure, Correct Position, site marked, Risks and benefits discussed,  Surgical consent,  Pre-op evaluation,  At surgeon's request and post-op pain management  Laterality: Right  Prep: chloraprep       Needles:  Injection technique: Single-shot  Needle Type: Echogenic Needle     Needle Length: 9cm  Needle Gauge: 21     Additional Needles:   Procedures:,,,, ultrasound used (permanent image in chart),,,,  Narrative:  Start time: 12/20/2018 8:54 AM End time: 12/20/2018 8:59 AM Injection made incrementally with aspirations every 5 mL.  Performed by: Personally  Anesthesiologist: Catalina Gravel, MD  Additional Notes: No pain on injection. No increased resistance to injection. Injection made in 5cc increments.  Good needle visualization.  Patient tolerated procedure well.

## 2018-12-20 NOTE — H&P (Signed)
TOTAL KNEE ADMISSION H&P  Patient is being admitted for right total knee arthroplasty.  Subjective:  Chief Complaint:right knee pain.  HPI: [name], 78 y.o. female, has a history of pain and functional disability in the right knee due to arthritis and has failed non-surgical conservative treatments for greater than 12 weeks to includeNSAID's and/or analgesics, corticosteriod injections, viscosupplementation injections, flexibility and strengthening excercises, use of assistive devices and activity modification.  Onset of symptoms was gradual, starting 3 years ago with gradually worsening course since that time. The patient noted no past surgery on the right knee(s).  Patient currently rates pain in the right knee(s) at 10 out of 10 with activity. Patient has night pain, worsening of pain with activity and weight bearing, pain that interferes with activities of daily living, pain with passive range of motion, crepitus and joint swelling.  Patient has evidence of subchondral cysts, subchondral sclerosis, periarticular osteophytes and joint space narrowing by imaging studies. There is no active infection.  Patient Active Problem List   Diagnosis Date Noted  . Chronic pain of left knee 09/06/2017  . Unilateral primary osteoarthritis, left knee 09/06/2017  . Chronic pain of right knee 09/06/2017  . Leg length difference, acquired 03/06/2017  . Low back pain 03/06/2017  . Unilateral primary osteoarthritis, right knee 12/06/2016  . 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 Degenerative arthritis of hip 04/19/2012  . Anxiety   . Cancer ([place])   . Depression   . Arthritis   . RENAL CALCULUS, RIGHT 12/15/2008  . ABDOMINAL PAIN, UNSPECIFIED SITE 12/15/2008  . NEPHROLITHIASIS, HX OF 12/15/2008  . ANXIETY DEPRESSION 10/29/2007  . CONTACT DERMATITIS DUE TO SOLAR RADIATION 10/29/2007  . OSTEOARTHROSIS, LOCAL, PRIMARY, UNSPC SITE 08/16/2007   Past Medical History:  Diagnosis Date   . Anxiety   . Arthritis    lower back, hip  . Back pain    generally "causes severe stiffness"  . Complication of anesthesia    "felt it made her feel very goofy for several monthS with general anesthetic"  . Depression   . GERD (gastroesophageal reflux disease)   . History of kidney stones 1988   x1 episode passed on own  . Hypertension   . 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 Osteopenia   . 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 APPENDECTOMY  2010   open  . BREAST LUMPECTOMY Left 1961   benign tumor  . TOTAL HIP ARTHROPLASTY  04/19/2012   Procedure: TOTAL HIP ARTHROPLASTY ANTERIOR APPROACH;  Surgeon: Mcarthur Rossetti, MD;  Location: [facility];  Service: Orthopedics;  Laterality: Left;  Left Total Hip Arthroplasty  . TOTAL HIP ARTHROPLASTY Right 04/21/2016   Procedure: RIGHT TOTAL HIP ARTHROPLASTY ANTERIOR APPROACH;  Surgeon: Mcarthur Rossetti, MD;  Location: [facility];  Service: Orthopedics;  Laterality: Right;    Current Facility-Administered Medications  Medication Dose Route Frequency Provider Last Rate Last Dose  . ceFAZolin (ANCEF) IVPB 2g/100 mL premix  2 g Intravenous On Call to OR Pete Pelt, PA-C      . chlorhexidine (HIBICLENS) 4 % liquid 4 application  60 mL Topical Once Pete Pelt, PA-C      . fentaNYL (SUBLIMAZE) injection 50-100 mcg  50-100 mcg Intravenous UD Catalina Gravel, MD      . lactated ringers infusion   Intravenous Continuous Catalina Gravel, MD 20 mL/hr at 12/20/18 [phone]    .  midazolam (VERSED) 2 MG/2ML injection           . midazolam (VERSED) injection 1-2 mg  1-2 mg Intravenous UD Catalina Gravel, MD      . tranexamic acid (CYKLOKAPRON) IVPB 1,000 mg  1,000 mg Intravenous To OR Pete Pelt, PA-C       Allergies  Allergen Reactions  . Adhesive [Tape] Itching and Rash    Social History   Tobacco Use  . Smoking status: Former Smoker    Types:  Cigarettes    Last attempt to quit: 04/15/1986    Years since quitting: 32.7  . Smokeless tobacco: Never Used  Substance Use Topics  . Alcohol use: Yes    Alcohol/week: 7.0 standard drinks    Types: 7 Standard drinks or equivalent per week    Comment: 1 drink daily    Family History  Problem Relation Age of Onset  . Cancer Father        prostate  . Hypertension Father   . Heart attack Father   . Cancer Brother        Occupational psychologist  . Cancer Maternal Grandmother        throat  . Breast cancer Paternal Grandmother      Review of Systems  Musculoskeletal: Positive for joint pain.  All other systems reviewed and are negative.   Objective:  Physical Exam  Constitutional: She is oriented to person, place, and time. She appears well-developed and well-nourished.  HENT:  Head: Normocephalic and atraumatic.  Eyes: Pupils are equal, round, and reactive to light. EOM are normal.  Neck: Normal range of motion. Neck supple.  Cardiovascular: Normal rate and regular rhythm.  Respiratory: Effort normal and breath sounds normal.  GI: Soft. Bowel sounds are normal.  Musculoskeletal:     Right knee: She exhibits decreased range of motion, swelling, abnormal alignment, bony tenderness and abnormal meniscus. Tenderness found. Medial joint line and lateral joint line tenderness noted.  Neurological: She is alert and oriented to person, place, and time.  Skin: Skin is warm and dry.  Psychiatric: She has a normal mood and affect.    Vital signs in last 24 hours: Temp:  [98.2 F (36.8 C)-98.5 F (36.9 C)] 98.2 F (36.8 C) (02/21 0742) Pulse Rate:  [50-72] 50 (02/21 0742) Resp:  [14] 14 (02/21 0742) BP: (125-131)/(67-74) 125/67 (02/21 0742) SpO2:  [95 %-100 %] 95 % (02/21 0742) Weight:  [70.8 kg] 70.8 kg (02/21 0801)  Labs:   Estimated body mass index is 25.18 kg/m as calculated from the following:   Height as of this encounter: 5\' 6"  (1.676 m).   Weight as of this encounter:  70.8 kg.   Imaging Review Plain radiographs demonstrate severe degenerative joint disease of the right knee(s). The overall alignment ismild valgus. The bone quality appears to be good for age and reported activity level.      Assessment/Plan:  End stage arthritis, right knee   The patient history, physical examination, clinical judgment of the provider and imaging studies are consistent with end stage degenerative joint disease of the right knee(s) and total knee arthroplasty is deemed medically necessary. The treatment options including medical management, injection therapy arthroscopy and arthroplasty were discussed at length. The risks and benefits of total knee arthroplasty were presented and reviewed. The risks due to aseptic loosening, infection, stiffness, patella tracking problems, thromboembolic complications and other imponderables were discussed. The patient acknowledged the explanation, agreed to proceed with the plan and consent was signed.  Patient is being admitted for inpatient treatment for surgery, pain control, PT, OT, prophylactic antibiotics, VTE prophylaxis, progressive ambulation and ADL's and discharge planning. The patient is planning to be discharged home with home health services     Patient's anticipated LOS is less than 2 midnights, meeting these requirements: - Younger than 62 - Lives within 1 hour of care - Has a competent adult at home to recover with post-op recover - NO history of  - Chronic pain requiring opiods  - Diabetes  - Coronary Artery Disease  - Heart failure  - Heart attack  - Stroke  - DVT/VTE  - Cardiac arrhythmia  - Respiratory Failure/COPD  - Renal failure  - Anemia  - Advanced Liver disease

## 2018-12-20 NOTE — Brief Op Note (Signed)
12/20/2018  11:47 AM  PATIENT:  [name]  78 y.o. female  PRE-OPERATIVE DIAGNOSIS:  osteoarthritis right knee  POST-OPERATIVE DIAGNOSIS:  osteoarthritis right knee  PROCEDURE:  Procedure(s): RIGHT TOTAL KNEE ARTHROPLASTY (Right)  SURGEON:  Surgeon(s) and Role:    Mcarthur Rossetti, MD - Primary  PHYSICIAN ASSISTANT: Benita Stabile, PA-C  ANESTHESIA:   local, regional and spinal  EBL:  100 mL   COUNTS:  YES  TOURNIQUET:   Total Tourniquet Time Documented: Thigh (Right) - 56 minutes Total: Thigh (Right) - 56 minutes   DICTATION: .Other Dictation: Dictation Number [id number]  PLAN OF CARE: Admit to inpatient   PATIENT DISPOSITION:  PACU - hemodynamically stable.   Delay start of Pharmacological VTE agent (>24hrs) due to surgical blood loss or risk of bleeding: no

## 2018-12-20 NOTE — Evaluation (Signed)
Physical Therapy Evaluation Patient Details Name: [name] MRN: [id number] DOB: 24-Aug-1941 Today's Date: 12/20/2018   History of Present Illness  Pt s/p R TKR and with hx of bil THR  Clinical Impression  Pt s/p R TKR and presents with decreased R LE strength/ROM and post op pain limiting functional mobility.  Pt would benefit from follow up rehab at SNF level to maximize IND and safety prior to return home ALONE.    Follow Up Recommendations SNF    Equipment Recommendations  None recommended by PT    Recommendations for Other Services       Precautions / Restrictions Precautions Precautions: Knee;Fall Required Braces or Orthoses: Knee Immobilizer - Right Knee Immobilizer - Right: Discontinue once straight leg raise with < 10 degree lag Restrictions Weight Bearing Restrictions: No Other Position/Activity Restrictions: WBAT      Mobility  Bed Mobility Overal bed mobility: Needs Assistance Bed Mobility: Supine to Sit     Supine to sit: Min assist;Mod assist     General bed mobility comments: cues for sequence and use of L LE to self assist  Transfers Overall transfer level: Needs assistance Equipment used: Rolling walker (2 wheeled) Transfers: Sit to/from Stand Sit to Stand: Min assist         General transfer comment: cues for LE management and use of UEs to self assist  Ambulation/Gait Ambulation/Gait assistance: Min assist Gait Distance (Feet): 42 Feet Assistive device: Rolling walker (2 wheeled) Gait Pattern/deviations: Step-to pattern;Decreased step length - right;Decreased step length - left;Shuffle;Trunk flexed Gait velocity: decr   General Gait Details: cues for sequence, posture and position from [employer]            Wheelchair Mobility    Modified Rankin (Stroke Patients Only)       Balance Overall balance assessment: Mild deficits observed, not formally tested                                            Pertinent Vitals/Pain Pain Assessment: 0-10 Pain Score: 3  Pain Location: R knee Pain Descriptors / Indicators: Aching;Sore Pain Intervention(s): Limited activity within patient's tolerance;Monitored during session;Premedicated before session;Ice applied    Home Living Family/patient expects to be discharged to:: Skilled nursing facility Living Arrangements: Alone                    Prior Function Level of Independence: Independent               Hand Dominance        Extremity/Trunk Assessment   Upper Extremity Assessment Upper Extremity Assessment: Overall WFL for tasks assessed    Lower Extremity Assessment Lower Extremity Assessment: RLE deficits/detail       Communication   Communication: No difficulties  Cognition Arousal/Alertness: Awake/alert Behavior During Therapy: WFL for tasks assessed/performed Overall Cognitive Status: Within Functional Limits for tasks assessed                                        General Comments      Exercises Total Joint Exercises Ankle Circles/Pumps: AROM;Both;15 reps;Supine   Assessment/Plan    PT Assessment Patient needs continued PT services  PT Problem List Decreased strength;Decreased range of motion;Decreased activity tolerance;Decreased mobility;Decreased knowledge of use of DME;Pain  PT Treatment Interventions DME instruction;Gait training;Functional mobility training;Therapeutic activities;Therapeutic exercise;Patient/family education    PT Goals (Current goals can be found in the Care Plan section)  Acute Rehab PT Goals Patient Stated Goal: Regain IND PT Goal Formulation: With patient Time For Goal Achievement: 12/27/18 Potential to Achieve Goals: Good    Frequency 7X/week   Barriers to discharge        Co-evaluation               AM-PAC PT "6 Clicks" Mobility  Outcome Measure Help needed turning from your back to your side while in a flat bed without  using bedrails?: A Little Help needed moving from lying on your back to sitting on the side of a flat bed without using bedrails?: A Little Help needed moving to and from a bed to a chair (including a wheelchair)?: A Little Help needed standing up from a chair using your arms (e.g., wheelchair or bedside chair)?: A Little Help needed to walk in hospital room?: A Little Help needed climbing 3-5 steps with a railing? : A Lot 6 Click Score: 17    End of Session Equipment Utilized During Treatment: Gait belt;Right knee immobilizer Activity Tolerance: Patient tolerated treatment well Patient left: in chair;with call bell/phone within reach;with chair alarm set Nurse Communication: Mobility status PT Visit Diagnosis: Difficulty in walking, not elsewhere classified (R26.2)    Time: 4825-0037 PT Time Calculation (min) (ACUTE ONLY): 25 min   Charges:   PT Evaluation $PT Eval Low Complexity: 1 Low PT Treatments $Gait Training: 8-22 mins        [employer] Pager [phone] Office [phone]   Estellar Cadena 12/20/2018, 4:55 PM

## 2018-12-20 NOTE — Progress Notes (Signed)
Assisted Dr. Turk with right, ultrasound guided, adductor canal block. Side rails up, monitors on throughout procedure. See vital signs in flow sheet. Tolerated Procedure well.  

## 2018-12-20 NOTE — Anesthesia Preprocedure Evaluation (Addendum)
Anesthesia Evaluation  Patient identified by MRN, date of birth, ID band Patient awake    Reviewed: Allergy & Precautions, NPO status , Patient's Chart, lab work & pertinent test results  History of Anesthesia Complications (+) history of anesthetic complications  Airway Mallampati: II  TM Distance: >3 FB Neck ROM: Full    Dental  (+) Dental Advisory Given, Caps, Teeth Intact   Pulmonary former smoker,    Pulmonary exam normal breath sounds clear to auscultation       Cardiovascular hypertension, Pt. on medications Normal cardiovascular exam Rhythm:Regular Rate:Normal     Neuro/Psych PSYCHIATRIC DISORDERS Anxiety Depression negative neurological ROS     GI/Hepatic Neg liver ROS, GERD  Medicated,  Endo/Other  negative endocrine ROS  Renal/GU negative Renal ROS     Musculoskeletal  (+) Arthritis , Osteoarthritis,    Abdominal   Peds  Hematology negative hematology ROS (+) Plt 209k   Anesthesia Other Findings Day of surgery medications reviewed with the patient.  Reproductive/Obstetrics                            Anesthesia Physical Anesthesia Plan  ASA: II  Anesthesia Plan: Spinal   Post-op Pain Management:  Regional for Post-op pain   Induction:   PONV Risk Score and Plan: 2 and Propofol infusion and Treatment may vary due to age or medical condition  Airway Management Planned: Natural Airway and Nasal Cannula  Additional Equipment:   Intra-op Plan:   Post-operative Plan:   Informed Consent: I have reviewed the patients History and Physical, chart, labs and discussed the procedure including the risks, benefits and alternatives for the proposed anesthesia with the patient or authorized representative who has indicated his/her understanding and acceptance.     Dental advisory given  Plan Discussed with: CRNA, Anesthesiologist and Surgeon  Anesthesia Plan Comments:          Anesthesia Quick Evaluation

## 2018-12-20 NOTE — Anesthesia Postprocedure Evaluation (Signed)
Anesthesia Post Note  Patient: [name]  Procedure(s) Performed: RIGHT TOTAL KNEE ARTHROPLASTY (Right Knee)     Patient location during evaluation: PACU Anesthesia Type: Spinal Level of consciousness: oriented and awake and alert Pain management: pain level controlled Vital Signs Assessment: post-procedure vital signs reviewed and stable Respiratory status: spontaneous breathing, respiratory function stable and patient connected to nasal cannula oxygen Cardiovascular status: blood pressure returned to baseline and stable Postop Assessment: no headache, no backache and no apparent nausea or vomiting Anesthetic complications: no    Last Vitals:  Vitals:   12/20/18 1315 12/20/18 1423  BP: 125/62 139/73  Pulse: 80 74  Resp: 15 16  Temp: 36.6 C   SpO2: 100% 100%    Last Pain:  Vitals:   12/20/18 1435  TempSrc:   PainSc: 7                  Catalina Gravel

## 2018-12-21 DIAGNOSIS — M1711 Unilateral primary osteoarthritis, right knee: Secondary | ICD-10-CM | POA: Diagnosis not present

## 2018-12-21 LAB — CBC
HCT: 32.4 % — ABNORMAL LOW (ref 36.0–46.0)
Hemoglobin: 10.2 g/dL — ABNORMAL LOW (ref 12.0–15.0)
MCH: 30.9 pg (ref 26.0–34.0)
MCHC: 31.5 g/dL (ref 30.0–36.0)
MCV: 98.2 fL (ref 80.0–100.0)
Platelets: 161 10*3/uL (ref 150–400)
RBC: 3.3 MIL/uL — ABNORMAL LOW (ref 3.87–5.11)
RDW: 13.2 % (ref 11.5–15.5)
WBC: 10.6 10*3/uL — ABNORMAL HIGH (ref 4.0–10.5)
nRBC: 0 % (ref 0.0–0.2)

## 2018-12-21 LAB — BASIC METABOLIC PANEL
Anion gap: 6 (ref 5–15)
BUN: 12 mg/dL (ref 8–23)
CO2: 27 mmol/L (ref 22–32)
Calcium: 8.3 mg/dL — ABNORMAL LOW (ref 8.9–10.3)
Chloride: 103 mmol/L (ref 98–111)
Creatinine, Ser: 0.68 mg/dL (ref 0.44–1.00)
GFR calc Af Amer: >60 mL/min (ref >60)
GFR calc non Af Amer: >60 mL/min (ref >60)
Glucose, Bld: 130 mg/dL — ABNORMAL HIGH (ref 70–99)
Potassium: 3.8 mmol/L (ref 3.5–5.1)
Sodium: 136 mmol/L (ref 135–145)

## 2018-12-21 MED ORDER — OXYCODONE HCL ER 10 MG PO T12A
10.0000 mg | EXTENDED_RELEASE_TABLET | Freq: Two times a day (BID) | ORAL | Status: DC
  Administered 2018-12-21 – 2018-12-25 (×8): 10 mg via ORAL
  Filled 2018-12-21 (×8): qty 1

## 2018-12-21 MED ORDER — OXYCODONE HCL 5 MG PO TABS
10.0000 mg | ORAL_TABLET | ORAL | Status: DC | PRN
  Administered 2018-12-22: 10 mg via ORAL

## 2018-12-21 MED ORDER — OXYCODONE HCL 5 MG PO TABS
5.0000 mg | ORAL_TABLET | ORAL | Status: DC | PRN
  Administered 2018-12-21 – 2018-12-22 (×4): 10 mg via ORAL
  Administered 2018-12-23: 5 mg via ORAL
  Filled 2018-12-21 (×3): qty 2
  Filled 2018-12-21: qty 1
  Filled 2018-12-21 (×2): qty 2

## 2018-12-21 NOTE — Progress Notes (Signed)
OT Cancellation Note  Patient Details Name: [name] MRN: [id number] DOB: October 12, 1941   Cancelled Treatment:    Reason Eval/Treat Not Completed: Other (comment) Appears plan is for SNF. Will defer OT eval to SNF. Please notify OT if eval needed however.  [place]  [facility] [phone] 12/21/2018, 12:17 PM

## 2018-12-21 NOTE — Progress Notes (Signed)
Physical Therapy Treatment Patient Details Name: [name] MRN: [id number] DOB: [birth year] Today's Date: 12/21/2018    History of Present Illness Pt s/p R TKR and with hx of bil THR    PT Comments    Pt very motivated and progressing well with mobility.   Follow Up Recommendations  SNF     Equipment Recommendations  None recommended by PT    Recommendations for Other Services       Precautions / Restrictions Precautions Precautions: Knee;Fall Required Braces or Orthoses: Knee Immobilizer - Right Knee Immobilizer - Right: Discontinue once straight leg raise with < 10 degree lag Restrictions Weight Bearing Restrictions: No Other Position/Activity Restrictions: WBAT    Mobility  Bed Mobility Overal bed mobility: Needs Assistance Bed Mobility: Supine to Sit     Supine to sit: Min assist     General bed mobility comments: cues for sequence and use of L LE to self assist  Transfers Overall transfer level: Needs assistance Equipment used: Rolling walker (2 wheeled) Transfers: Sit to/from Stand Sit to Stand: Min assist         General transfer comment: cues for LE management and use of UEs to self assist  Ambulation/Gait Ambulation/Gait assistance: Min assist Gait Distance (Feet): 75 Feet Assistive device: Rolling walker (2 wheeled) Gait Pattern/deviations: Step-to pattern;Decreased step length - right;Decreased step length - left;Shuffle;Trunk flexed Gait velocity: decr   General Gait Details: cues for sequence, posture and position from [employer]             Wheelchair Mobility    Modified Rankin (Stroke Patients Only)       Balance Overall balance assessment: Mild deficits observed, not formally tested                                          Cognition Arousal/Alertness: Awake/alert Behavior During Therapy: WFL for tasks assessed/performed Overall Cognitive Status: Within Functional Limits for tasks assessed                                         Exercises Total Joint Exercises Ankle Circles/Pumps: AROM;Both;15 reps;Supine Quad Sets: AROM;Both;10 reps;Supine Heel Slides: AAROM;Right;15 reps;Supine Straight Leg Raises: AAROM;Right;10 reps Goniometric ROM: AAROM R knee -8 - 40    General Comments        Pertinent Vitals/Pain Pain Assessment: Faces Faces Pain Scale: Hurts little more Pain Location: R knee Pain Descriptors / Indicators: Aching;Sore Pain Intervention(s): Limited activity within patient's tolerance;Monitored during session;Premedicated before session;Ice applied    Home Living                      Prior Function            PT Goals (current goals can now be found in the care plan section) Acute Rehab PT Goals Patient Stated Goal: Regain IND PT Goal Formulation: With patient Time For Goal Achievement: 12/27/18 Potential to Achieve Goals: Good Progress towards PT goals: Progressing toward goals    Frequency    7X/week      PT Plan Current plan remains appropriate    Co-evaluation              AM-PAC PT "6 Clicks" Mobility   Outcome Measure  Help needed turning from your back  to your side while in a flat bed without using bedrails?: A Little Help needed moving from lying on your back to sitting on the side of a flat bed without using bedrails?: A Little Help needed moving to and from a bed to a chair (including a wheelchair)?: A Little Help needed standing up from a chair using your arms (e.g., wheelchair or bedside chair)?: A Little Help needed to walk in hospital room?: A Little Help needed climbing 3-5 steps with a railing? : A Lot 6 Click Score: 17    End of Session Equipment Utilized During Treatment: Gait belt;Right knee immobilizer Activity Tolerance: Patient tolerated treatment well Patient left: in chair;with call bell/phone within reach;with chair alarm set Nurse Communication: Mobility status PT Visit Diagnosis:  Difficulty in walking, not elsewhere classified (R26.2)     Time: 6226-3335 PT Time Calculation (min) (ACUTE ONLY): 32 min  Charges:  $Gait Training: 8-22 mins $Therapeutic Exercise: 8-22 mins                     [place] Pager [phone] Office [phone]    Paetyn Pietrzak 12/21/2018, 10:30 AM

## 2018-12-21 NOTE — Plan of Care (Signed)
  Problem: Pain Management: Goal: Pain level will decrease with appropriate interventions Outcome: Progressing   Problem: Clinical Measurements: Goal: Postoperative complications will be avoided or minimized Outcome: Progressing   Problem: Safety: Goal: Ability to remain free from injury will improve Outcome: Progressing   Problem: Elimination: Goal: Will not experience complications related to bowel motility Outcome: Progressing   Problem: Pain Managment: Goal: General experience of comfort will improve Outcome: Progressing

## 2018-12-21 NOTE — Progress Notes (Signed)
Physical Therapy Treatment Patient Details Name: [name] MRN: [id number] DOB: [birth year] Today's Date: 12/21/2018    History of Present Illness Pt s/p R TKR and with hx of bil THR    PT Comments    Pt motivated and progressing steadily with mobility.   Follow Up Recommendations  SNF     Equipment Recommendations  None recommended by PT    Recommendations for Other Services       Precautions / Restrictions Precautions Precautions: Knee;Fall Required Braces or Orthoses: Knee Immobilizer - Right Knee Immobilizer - Right: Discontinue once straight leg raise with < 10 degree lag(Pt performed IND SLR this am) Restrictions Weight Bearing Restrictions: No Other Position/Activity Restrictions: WBAT    Mobility  Bed Mobility Overal bed mobility: Needs Assistance Bed Mobility: Sit to Supine       Sit to supine: Min assist   General bed mobility comments: cues for sequence and use of L LE to self assist  Transfers Overall transfer level: Needs assistance Equipment used: Rolling walker (2 wheeled) Transfers: Sit to/from Stand Sit to Stand: Min assist         General transfer comment: cues for LE management and use of UEs to self assist  Ambulation/Gait Ambulation/Gait assistance: Min assist Gait Distance (Feet): 75 Feet(twice and 20' to bathroom) Assistive device: Rolling walker (2 wheeled) Gait Pattern/deviations: Step-to pattern;Decreased step length - right;Decreased step length - left;Shuffle;Trunk flexed Gait velocity: decr   General Gait Details: cues for sequence, posture and position from [employer]             Wheelchair Mobility    Modified Rankin (Stroke Patients Only)       Balance Overall balance assessment: Mild deficits observed, not formally tested                                          Cognition Arousal/Alertness: Awake/alert Behavior During Therapy: WFL for tasks assessed/performed Overall Cognitive  Status: Within Functional Limits for tasks assessed                                        Exercises      General Comments        Pertinent Vitals/Pain Pain Assessment: Faces Faces Pain Scale: Hurts little more Pain Location: R knee Pain Descriptors / Indicators: Aching;Sore Pain Intervention(s): Limited activity within patient's tolerance;Monitored during session;Premedicated before session;Ice applied    Home Living                      Prior Function            PT Goals (current goals can now be found in the care plan section) Acute Rehab PT Goals Patient Stated Goal: Regain IND PT Goal Formulation: With patient Time For Goal Achievement: 12/27/18 Potential to Achieve Goals: Good Progress towards PT goals: Progressing toward goals    Frequency    7X/week      PT Plan Current plan remains appropriate    Co-evaluation              AM-PAC PT "6 Clicks" Mobility   Outcome Measure  Help needed turning from your back to your side while in a flat bed without using bedrails?: A Little Help needed moving from lying  on your back to sitting on the side of a flat bed without using bedrails?: A Little Help needed moving to and from a bed to a chair (including a wheelchair)?: A Little Help needed standing up from a chair using your arms (e.g., wheelchair or bedside chair)?: A Little Help needed to walk in hospital room?: A Little Help needed climbing 3-5 steps with a railing? : A Lot 6 Click Score: 17    End of Session Equipment Utilized During Treatment: Gait belt;Right knee immobilizer Activity Tolerance: Patient tolerated treatment well Patient left: in bed;with call bell/phone within reach;with family/visitor present Nurse Communication: Mobility status PT Visit Diagnosis: Difficulty in walking, not elsewhere classified (R26.2)     Time: 6812-7517 PT Time Calculation (min) (ACUTE ONLY): 25 min  Charges:  $Gait Training: 23-37  mins                     [place] Pager [phone] Office [phone]    Roddrick Sharron 12/21/2018, 3:28 PM

## 2018-12-21 NOTE — Discharge Instructions (Signed)

## 2018-12-21 NOTE — Plan of Care (Signed)
  Problem: Activity: Goal: Risk for activity intolerance will decrease Outcome: Progressing   Problem: Nutrition: Goal: Adequate nutrition will be maintained Outcome: Progressing   Problem: Coping: Goal: Level of anxiety will decrease Outcome: Progressing   Problem: Elimination: Goal: Will not experience complications related to bowel motility Outcome: Progressing Goal: Will not experience complications related to urinary retention Outcome: Progressing   Problem: Pain Managment: Goal: General experience of comfort will improve Outcome: Progressing   Problem: Safety: Goal: Ability to remain free from injury will improve Outcome: Progressing   Problem: Skin Integrity: Goal: Risk for impaired skin integrity will decrease Outcome: Progressing   Problem: Activity: Goal: Range of joint motion will improve Outcome: Progressing   Problem: Clinical Measurements: Goal: Postoperative complications will be avoided or minimized Outcome: Progressing   Problem: Pain Management: Goal: Pain level will decrease with appropriate interventions Outcome: Progressing   Problem: Skin Integrity: Goal: Will show signs of wound healing Outcome: Progressing

## 2018-12-21 NOTE — Progress Notes (Signed)
Subjective: 1 Day Post-Op Procedure(s) (LRB): RIGHT TOTAL KNEE ARTHROPLASTY (Right) Patient reports pain as moderate.    Objective: Vital signs in last 24 hours: Temp:  [97.7 F (36.5 C)-98.6 F (37 C)] 98 F (36.7 C) (02/22 0945) Pulse Rate:  [63-80] 78 (02/22 0945) Resp:  [15-20] 20 (02/22 0945) BP: (112-140)/(57-74) 127/74 (02/22 0945) SpO2:  [96 %-100 %] 99 % (02/22 0945) Weight:  [70.8 kg] 70.8 kg (02/21 1315)  Intake/Output from previous day: 02/21 0701 - 02/22 0700 In: 3010.7 [P.O.:240; I.V.:2720.7; IV Piggyback:50] Out: 2000 [Urine:1900; Blood:100] Intake/Output this shift: Total I/O In: 120 [P.O.:120] Out: -   Recent Labs    12/21/18 0404  HGB 10.2*   Recent Labs    12/21/18 0404  WBC 10.6*  RBC 3.30*  HCT 32.4*  PLT 161   Recent Labs    12/21/18 0404  NA 136  K 3.8  CL 103  CO2 27  BUN 12  CREATININE 0.68  GLUCOSE 130*  CALCIUM 8.3*   No results for input(s): LABPT, INR in the last 72 hours.  Sensation intact distally Intact pulses distally Dorsiflexion/Plantar flexion intact Incision: dressing C/D/I No cellulitis present Compartment soft   Assessment/Plan: 1 Day Post-Op Procedure(s) (LRB): RIGHT TOTAL KNEE ARTHROPLASTY (Right) Up with therapy Discharge to SNF Monday   Anticipated LOS equal to or greater than 2 midnights due to - Age 98 and older with one or more of the following:  - Obesity  - Expected need for hospital services (PT, OT, Nursing) required for safe  discharge  - Anticipated need for postoperative skilled nursing care or inpatient rehab  - Active co-morbidities: None OR   - Unanticipated findings during/Post Surgery: Slow post-op progression: GI, pain control, mobility  - Patient is a high risk of re-admission due to: None    Mcarthur Rossetti 12/21/2018, 1:12 PM

## 2018-12-22 DIAGNOSIS — M1711 Unilateral primary osteoarthritis, right knee: Secondary | ICD-10-CM | POA: Diagnosis not present

## 2018-12-22 NOTE — Plan of Care (Signed)
  Problem: Activity: Goal: Risk for activity intolerance will decrease Outcome: Progressing   Problem: Nutrition: Goal: Adequate nutrition will be maintained Outcome: Progressing   Problem: Coping: Goal: Level of anxiety will decrease Outcome: Progressing   Problem: Elimination: Goal: Will not experience complications related to bowel motility Outcome: Progressing Goal: Will not experience complications related to urinary retention Outcome: Progressing   Problem: Pain Managment: Goal: General experience of comfort will improve Outcome: Progressing   Problem: Safety: Goal: Ability to remain free from injury will improve Outcome: Progressing   Problem: Skin Integrity: Goal: Risk for impaired skin integrity will decrease Outcome: Progressing   Problem: Activity: Goal: Range of joint motion will improve Outcome: Progressing   Problem: Clinical Measurements: Goal: Postoperative complications will be avoided or minimized Outcome: Progressing   Problem: Pain Management: Goal: Pain level will decrease with appropriate interventions Outcome: Progressing

## 2018-12-22 NOTE — NC FL2 (Signed)
[place] LEVEL OF CARE SCREENING TOOL     IDENTIFICATION  Patient Name: [name] Birthdate: 01-13-1941 Sex: female Admission Date (Current Location): 12/20/2018  [facility] and [place] Number:  Herbalist and Address:  [facility],  [address] [place], [place]      Provider Number: [id number]  Attending Physician Name and Address:  Mcarthur Rossetti  Relative Name and Phone Number:       Current Level of Care: Hospital Recommended Level of Care: [facility] Prior Approval Number:    Date Approved/Denied:   PASRR Number: pending  Discharge Plan: SNF    Current Diagnoses: Patient Active Problem List   Diagnosis Date Noted  . 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 Chronic pain of left knee 09/06/2017  . Unilateral primary osteoarthritis, left knee 09/06/2017  . Chronic pain of right knee 09/06/2017  . Leg length difference, acquired 03/06/2017  . Low back pain 03/06/2017  . Unilateral primary osteoarthritis, right knee 12/06/2016  . 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 Degenerative arthritis of hip 04/19/2012  . Anxiety   . Cancer ([place])   . Depression   . Arthritis   . RENAL CALCULUS, RIGHT 12/15/2008  . ABDOMINAL PAIN, UNSPECIFIED SITE 12/15/2008  . NEPHROLITHIASIS, HX OF 12/15/2008  . ANXIETY DEPRESSION 10/29/2007  . CONTACT DERMATITIS DUE TO SOLAR RADIATION 10/29/2007  . OSTEOARTHROSIS, LOCAL, PRIMARY, UNSPC SITE 08/16/2007    Orientation RESPIRATION BLADDER Height & Weight     Self, Time, Situation, Place  Normal Continent Weight: 156 lb (70.8 kg) Height:  5\' 6"  (167.6 cm)  BEHAVIORAL SYMPTOMS/MOOD NEUROLOGICAL BOWEL NUTRITION STATUS      Continent Diet(regular)  AMBULATORY STATUS COMMUNICATION OF NEEDS Skin   Extensive Assist Verbally Surgical wounds(right knee)                       Personal Care Assistance Level of Assistance  Bathing,  Feeding, Dressing Bathing Assistance: Limited assistance Feeding assistance: Independent Dressing Assistance: Limited assistance     Functional Limitations Info  Sight, Hearing, Speech Sight Info: Adequate Hearing Info: Adequate Speech Info: Adequate    SPECIAL CARE FACTORS FREQUENCY  PT (By licensed PT), OT (By licensed OT)     PT Frequency: 5x OT Frequency: 5x            Contractures Contractures Info: Not present    Additional Factors Info  Code Status, Allergies Code Status Info: full code Allergies Info: adhesive           Current Medications (12/22/2018):  This is the current hospital active medication list Current Facility-Administered Medications  Medication Dose Route Frequency Provider Last Rate Last Dose  . 0.9 %  sodium chloride infusion   Intravenous Continuous Mcarthur Rossetti, MD   Stopped at 12/21/18 1430  . acetaminophen (TYLENOL) tablet 325-650 mg  325-650 mg Oral Q6H PRN Mcarthur Rossetti, MD   1,000 mg at 12/20/18 0803  . ALPRAZolam Duanne Moron) tablet 0.125 mg  0.125 mg Oral PRN Mcarthur Rossetti, MD   0.125 mg at 12/21/18 2109  . alum & mag hydroxide-simeth (MAALOX/MYLANTA) 200-200-20 MG/5ML suspension 30 mL  30 mL Oral Q4H PRN Mcarthur Rossetti, MD      . aspirin chewable tablet 81 mg  81 mg Oral BID Mcarthur Rossetti, MD   81 mg at 12/21/18 2108  . atorvastatin (LIPITOR) tablet 10 mg  10 mg  Oral q1800 Mcarthur Rossetti, MD   10 mg at 12/21/18 1712  . citalopram (CELEXA) tablet 20 mg  20 mg Oral QHS Mcarthur Rossetti, MD   20 mg at 12/21/18 2109  . diphenhydrAMINE (BENADRYL) 12.5 MG/5ML elixir 12.5-25 mg  12.5-25 mg Oral Q4H PRN Mcarthur Rossetti, MD      . docusate sodium (COLACE) capsule 100 mg  100 mg Oral BID Mcarthur Rossetti, MD   100 mg at 12/21/18 2108  . hydrochlorothiazide (HYDRODIURIL) tablet 12.5 mg  12.5 mg Oral q morning - 10a Mcarthur Rossetti, MD   12.5 mg at 12/21/18 7591  .  HYDROmorphone (DILAUDID) injection 0.5-1 mg  0.5-1 mg Intravenous Q2H PRN Mcarthur Rossetti, MD   0.5 mg at 12/21/18 1158  . menthol-cetylpyridinium (CEPACOL) lozenge 3 mg  1 lozenge Oral PRN Mcarthur Rossetti, MD   3 mg at 12/21/18 2109   Or  . phenol (CHLORASEPTIC) mouth spray 1 spray  1 spray Mouth/Throat PRN Mcarthur Rossetti, MD      . methocarbamol (ROBAXIN) tablet 500 mg  500 mg Oral Q6H PRN Mcarthur Rossetti, MD   500 mg at 12/21/18 2109   Or  . methocarbamol (ROBAXIN) 500 mg in dextrose 5 % 50 mL IVPB  500 mg Intravenous Q6H PRN Mcarthur Rossetti, MD      . metoCLOPramide (REGLAN) tablet 5-10 mg  5-10 mg Oral Q8H PRN Mcarthur Rossetti, MD       Or  . metoCLOPramide (REGLAN) injection 5-10 mg  5-10 mg Intravenous Q8H PRN Mcarthur Rossetti, MD      . multivitamin with minerals tablet 1 tablet  1 tablet Oral Daily Mcarthur Rossetti, MD   1 tablet at 12/21/18 0831  . ondansetron (ZOFRAN) tablet 4 mg  4 mg Oral Q6H PRN Mcarthur Rossetti, MD       Or  . ondansetron [facility]) injection 4 mg  4 mg Intravenous Q6H PRN Mcarthur Rossetti, MD      . oxyCODONE (Oxy IR/ROXICODONE) immediate release tablet 10-15 mg  10-15 mg Oral Q3H PRN Mcarthur Rossetti, MD      . oxyCODONE (Oxy IR/ROXICODONE) immediate release tablet 5-10 mg  5-10 mg Oral Q3H PRN Mcarthur Rossetti, MD   10 mg at 12/22/18 0531  . oxyCODONE (OXYCONTIN) 12 hr tablet 10 mg  10 mg Oral Q12H Mcarthur Rossetti, MD   10 mg at 12/21/18 2108  . pantoprazole (PROTONIX) EC tablet 40 mg  40 mg Oral Daily Mcarthur Rossetti, MD   40 mg at 12/21/18 0831  . polyethylene glycol (MIRALAX / GLYCOLAX) packet 17 g  17 g Oral Daily PRN Mcarthur Rossetti, MD         Discharge Medications: Please see discharge summary for a list of discharge medications.  Relevant Imaging Results:  Relevant Lab Results:   Additional Information SS# [id number]  Nila Nephew, LCSW

## 2018-12-22 NOTE — Clinical Social Work Note (Signed)
Clinical Social Work Assessment  Patient Details  Name: [name] MRN: [id number] Date of Birth: August 29, 1941  Date of referral:  12/22/18               Reason for consult:  Facility Placement                Permission sought to share information with:  Chartered certified accountant granted to share information::  Yes, Verbal Permission Granted  Name::        Agency::  [facility]  Relationship::     Contact Information:     Housing/Transportation Living arrangements for the past 2 months:  Single Family Home Source of Information:  Patient Patient Interpreter Needed:  None Criminal Activity/Legal Involvement Pertinent to Current Situation/Hospitalization:  No - Comment as needed Significant Relationships:  Friend, Adult Children Lives with:  Self Do you feel safe going back to the place where you live?  Yes Need for family participation in patient care:  No (Coment)  Care giving concerns:  Pt admitted from home where she resides alone. Reports being independent at baseline. Admitted for knee replacement, now requiring much assistance. Pt reports she had hip replacement a few years ago and did HHPT afterward and she felt she progressed very well.   Social Worker assessment / plan:  CSW consulted to assist with transition to SNF for rehabilitation. Met with pt at bedside- she is alert and oriented, engaged in planning. Reports she has been planning to go to [facility] for rehab and has been in touch with facility PTA. CSW provided FL2 and referral to [facility] via [place]. CSW explained insurance authorization prior approval requirement with her Bernadene Person- pt reported not knowing that insurance would have to approve "because I've been working on this plan for 2 months and my insurance said they would pay for it." Pt appeared and expressed being very frustrated, CSW validated her emotions.  Submitted for pasrr- went to manual review.  Plan: admit to SNF for rehab once  insurance approval and Pasrr approval received. Pt planning for [place] will confirm with facility  Employment status:  Retired Insurance underwriter information:  Therapist, music) PT Recommendations:  [place] / Referral to community resources:  [place]  Patient/Family's Response to care:  Patent attorney but frustrated with Scientist, research (life sciences) process  Patient/Family's Understanding of and Emotional Response to Diagnosis, Current Treatment, and Prognosis:  Pt demonstrated very good understanding of her procedure and was good historian of health and care. Emotionally frustrated- see above.   Emotional Assessment Appearance:  Appears stated age, Well-Groomed Attitude/Demeanor/Rapport:  Engaged Affect (typically observed):  Accepting, Adaptable Orientation:  Oriented to Self, Oriented to Place, Oriented to  Time, Oriented to Situation Alcohol / Substance use:  Not Applicable Psych involvement (Current and /or in the community):  No (Comment)  Discharge Needs  Concerns to be addressed:  Discharge Planning Concerns Readmission within the last 30 days:  No Current discharge risk:  Dependent with Mobility, Lives alone Barriers to Discharge:  Awaiting Regulatory affairs officer Tour manager), Insurance Authorization   Nila Nephew, [place] 12/22/2018, 10:39 AM [phone] weekend coverage for [phone]

## 2018-12-22 NOTE — Progress Notes (Signed)
Subjective: 2 Days Post-Op Procedure(s) (LRB): RIGHT TOTAL KNEE ARTHROPLASTY (Right) Patient reports pain as mild.  Feeling good this am.  No complaints  Objective: Vital signs in last 24 hours: Temp:  [98 F (36.7 C)-99.2 F (37.3 C)] 99.2 F (37.3 C) (02/23 0453) Pulse Rate:  [71-78] 77 (02/23 0453) Resp:  [17-20] 17 (02/23 0453) BP: (113-135)/(50-74) 113/50 (02/23 0453) SpO2:  [92 %-99 %] 92 % (02/23 0453)  Intake/Output from previous day: 02/22 0701 - 02/23 0700 In: 1200.4 [P.O.:610; I.V.:590.4] Out: -  Intake/Output this shift: Total I/O In: 250 [P.O.:250] Out: -   Recent Labs    12/21/18 0404  HGB 10.2*   Recent Labs    12/21/18 0404  WBC 10.6*  RBC 3.30*  HCT 32.4*  PLT 161   Recent Labs    12/21/18 0404  NA 136  K 3.8  CL 103  CO2 27  BUN 12  CREATININE 0.68  GLUCOSE 130*  CALCIUM 8.3*   No results for input(s): LABPT, INR in the last 72 hours.  Neurologically intact Neurovascular intact Sensation intact distally Intact pulses distally Dorsiflexion/Plantar flexion intact Incision: dressing C/D/I No cellulitis present Compartment soft   Assessment/Plan: 2 Days Post-Op Procedure(s) (LRB): RIGHT TOTAL KNEE ARTHROPLASTY (Right) Advance diet Up with therapy D/C IV fluids Plan for discharge tomorrow Discharge to SNF  Please apply ted hose to [place] 12/22/2018, 6:49 AM

## 2018-12-22 NOTE — Evaluation (Signed)
Occupational Therapy Evaluation Patient Details Name: [name] MRN: [id number] DOB: 10-24-1941 Today's Date: 12/22/2018    History of Present Illness Pt s/p R TKR and with hx of bil THR   Clinical Impression  Pt is s/p TKA resulting in the deficits listed below (see OT Problem List).  Pt will benefit from skilled OT to increase their safety and independence with ADL and functional mobility for ADL to facilitate discharge to venue listed below.        Follow Up Recommendations  SNF    Equipment Recommendations  None recommended by OT;3 in 1 bedside commode    Recommendations for Other Services       Precautions / Restrictions Precautions Precautions: Knee;Fall Precaution Comments: able to perform IND SLRs today Required Braces or Orthoses: Knee Immobilizer - Right Knee Immobilizer - Right: Discontinue once straight leg raise with < 10 degree lag Restrictions Weight Bearing Restrictions: No Other Position/Activity Restrictions: WBAT      Mobility Bed Mobility Overal bed mobility: Needs Assistance Bed Mobility: Sit to Supine     Supine to sit: Min assist     General bed mobility comments: pt in recliner  Transfers Overall transfer level: Needs assistance Equipment used: Rolling walker (2 wheeled) Transfers: Sit to/from [employer] Sit to Stand: Min assist Stand pivot transfers: Min assist       General transfer comment: cues for LE management and use of UEs to self assist    Balance Overall balance assessment: Mild deficits observed, not formally tested         Standing balance support: Bilateral upper extremity supported Standing balance-Leahy Scale: Poor Standing balance comment: reliant on UEs for static and dynamic activity                           ADL either performed or assessed with clinical judgement   ADL Overall ADL's : Needs assistance/impaired     Grooming: Minimal assistance;Standing   Upper Body Bathing:  Set up;Sitting   Lower Body Bathing: Maximal assistance;Sit to/from stand;Cueing for safety   Upper Body Dressing : Set up;Sitting   Lower Body Dressing: Maximal assistance;Sit to/from stand;Cueing for safety   Toilet Transfer: RW;Ambulation;Minimal assistance   Toileting- Clothing Manipulation and Hygiene: Moderate assistance;Sit to/from stand;Cueing for safety         General ADL Comments: Pt lives alone and will need post acute rehab to increase rehab to increase I with ADL activity     Vision Patient Visual Report: No change from baseline       Perception     Praxis      Pertinent Vitals/Pain Pain Assessment: 0-10 Pain Score: 4  Pain Location: R knee Pain Descriptors / Indicators: Aching;Sore;Guarding Pain Intervention(s): Limited activity within patient's tolerance;Monitored during session;Premedicated before session;Ice applied           Research officer, trade union: No difficulties   Cognition Arousal/Alertness: Awake/alert Behavior During Therapy: WFL for tasks assessed/performed Overall Cognitive Status: Within Functional Limits for tasks assessed                                     General Comments       Exercises Total Joint Exercises Ankle Circles/Pumps: AROM;Both;15 reps;Supine Quad Sets: AROM;Both;10 reps;Supine Short Arc Quad: AAROM;Right;10 reps Heel Slides: AAROM;Right;15 reps;Supine Hip ABduction/ADduction: AAROM;Right;15 reps Straight Leg Raises: AAROM;Right;10 reps Knee Flexion: 5 reps;AAROM;Right;Seated  Goniometric ROM: AAROM R knee -6* to 60* flexion (after ex program/stretching)   Shoulder Instructions      Home Living Family/patient expects to be discharged to:: Skilled nursing facility Living Arrangements: Alone                                      Prior Functioning/Environment Level of Independence: Independent                 OT Problem List: Decreased strength;Impaired  balance (sitting and/or standing);Decreased knowledge of use of DME or AE;Decreased safety awareness      OT Treatment/Interventions: Self-care/ADL training;Patient/family education;DME and/or AE instruction    OT Goals(Current goals can be found in the care plan section) Acute Rehab OT Goals Patient Stated Goal: Regain IND  OT Frequency: Min 2X/week   Barriers to D/C: Decreased caregiver support          Co-evaluation              AM-PAC OT "6 Clicks" Daily Activity     Outcome Measure Help from another person eating meals?: None Help from another person taking care of personal grooming?: A Little Help from another person toileting, which includes using toliet, bedpan, or urinal?: A Little Help from another person bathing (including washing, rinsing, drying)?: A Little Help from another person to put on and taking off regular upper body clothing?: A Little Help from another person to put on and taking off regular lower body clothing?: A Lot 6 Click Score: 18   End of Session Nurse Communication: Mobility status  Activity Tolerance: Patient tolerated treatment well Patient left: in chair  OT Visit Diagnosis: Unsteadiness on feet (R26.81);Repeated falls (R29.6);Muscle weakness (generalized) (M62.81);Other abnormalities of gait and mobility (R26.89)                Time: 1030-1100 OT Time Calculation (min): 30 min Charges:  OT General Charges $OT Visit: 1 Visit OT Evaluation $OT Eval Low Complexity: 1 Low OT Treatments $Self Care/Home Management : 8-22 mins  Kari Baars, OT Acute Rehabilitation Services [phone] Office- [phone], Edwena Felty D 12/22/2018, 5:19 PM

## 2018-12-22 NOTE — Progress Notes (Signed)
Physical Therapy Treatment Patient Details Name: [name] MRN: [id number] DOB: 04/06/1941 Today's Date: 12/22/2018    History of Present Illness Pt s/p R TKR and with hx of bil THR    PT Comments    Pt progressing toward PT goals; pt demonstrates decr gait velocity, reliance on UEs for balance and is at risk for falls; pt will benefit from SNF post acute to allow safe return to independence; will continue to follow in acute setting.  Follow Up Recommendations  SNF     Equipment Recommendations  None recommended by PT    Recommendations for Other Services       Precautions / Restrictions Precautions Precautions: Knee;Fall Precaution Comments: able to perform IND SLRs today Required Braces or Orthoses: Knee Immobilizer - Right Knee Immobilizer - Right: Discontinue once straight leg raise with < 10 degree lag Restrictions Weight Bearing Restrictions: No Other Position/Activity Restrictions: WBAT    Mobility  Bed Mobility Overal bed mobility: Needs Assistance Bed Mobility: Sit to Supine     Supine to sit: Min assist     General bed mobility comments: pt in recliner  Transfers Overall transfer level: Needs assistance Equipment used: Rolling walker (2 wheeled) Transfers: Sit to/from [facility] Sit to Stand: Min assist Stand pivot transfers: Min assist       General transfer comment: cues for LE management and use of UEs to self assist  Ambulation/Gait Ambulation/Gait assistance: Min assist Gait Distance (Feet): (hallway ambulation) Assistive device: Rolling walker (2 wheeled) Gait Pattern/deviations: Step-to pattern;Decreased step length - right;Decreased step length - left;Decreased stance time - right Gait velocity: decr   General Gait Details: cues for sequence, posture and position from [employer] Mobility    Modified Rankin (Stroke Patients Only)       Balance Overall balance assessment: Mild  deficits observed, not formally tested         Standing balance support: Bilateral upper extremity supported Standing balance-Leahy Scale: Poor Standing balance comment: reliant on UEs for static and dynamic activity                            Cognition Arousal/Alertness: Awake/alert Behavior During Therapy: WFL for tasks assessed/performed Overall Cognitive Status: Within Functional Limits for tasks assessed                                        Exercises Total Joint Exercises Ankle Circles/Pumps: AROM;Both;15 reps;Supine Quad Sets: AROM;Both;10 reps;Supine Short Arc Quad: AAROM;Right;10 reps Heel Slides: AAROM;Right;15 reps;Supine Hip ABduction/ADduction: AAROM;Right;15 reps Straight Leg Raises: AAROM;Right;10 reps Knee Flexion: 5 reps;AAROM;Right;Seated Goniometric ROM: AAROM R knee -6* to 60* flexion (after ex program/stretching)    General Comments        Pertinent Vitals/Pain Pain Assessment: 0-10 Pain Score: 4  Pain Location: R knee Pain Descriptors / Indicators: Aching;Sore;Guarding Pain Intervention(s): Limited activity within patient's tolerance;Monitored during session;Premedicated before session;Ice applied    Home Living Family/patient expects to be discharged to:: Skilled nursing facility Living Arrangements: Alone                  Prior Function Level of Independence: Independent          PT Goals (current goals can now be found in the care plan section) Acute Rehab  PT Goals Patient Stated Goal: Regain IND PT Goal Formulation: With patient Time For Goal Achievement: 12/27/18 Potential to Achieve Goals: Good Progress towards PT goals: Progressing toward goals    Frequency    7X/week      PT Plan Current plan remains appropriate    Co-evaluation              AM-PAC PT "6 Clicks" Mobility   Outcome Measure  Help needed turning from your back to your side while in a flat bed without using  bedrails?: A Little Help needed moving from lying on your back to sitting on the side of a flat bed without using bedrails?: A Little Help needed moving to and from a bed to a chair (including a wheelchair)?: A Little Help needed standing up from a chair using your arms (e.g., wheelchair or bedside chair)?: A Little Help needed to walk in hospital room?: A Little Help needed climbing 3-5 steps with a railing? : A Lot 6 Click Score: 17    End of Session Equipment Utilized During Treatment: Gait belt Activity Tolerance: Patient tolerated treatment well Patient left: in chair;with call bell/phone within reach;with family/visitor present   PT Visit Diagnosis: Difficulty in walking, not elsewhere classified (R26.2)     Time: 7846-9629 PT Time Calculation (min) (ACUTE ONLY): 28 min  Charges:  $Gait Training: 8-22 mins $Therapeutic Exercise: 8-22 mins                     Kenyon Ana, PT  Pager: [phone] Acute Rehab Dept [facility]): [phone]   12/22/2018    [facility] 12/22/2018, 5:18 PM

## 2018-12-23 ENCOUNTER — Encounter (HOSPITAL_COMMUNITY): Payer: Self-pay | Admitting: Orthopaedic Surgery

## 2018-12-23 DIAGNOSIS — M1711 Unilateral primary osteoarthritis, right knee: Secondary | ICD-10-CM | POA: Diagnosis not present

## 2018-12-23 MED ORDER — OXYCODONE HCL 5 MG PO TABS: 5.0000 mg | ORAL_TABLET | ORAL | 0 refills | Status: DC | PRN

## 2018-12-23 MED ORDER — BISACODYL 10 MG RE SUPP
10.0000 mg | Freq: Once | RECTAL | Status: AC
  Administered 2018-12-23: 10 mg via RECTAL
  Filled 2018-12-23: qty 1

## 2018-12-23 MED ORDER — METHOCARBAMOL 500 MG PO TABS: 500.0000 mg | ORAL_TABLET | Freq: Four times a day (QID) | ORAL | 0 refills | Status: DC | PRN

## 2018-12-23 MED ORDER — ASPIRIN 81 MG PO CHEW: 81.0000 mg | CHEWABLE_TABLET | Freq: Two times a day (BID) | ORAL | 0 refills | Status: DC

## 2018-12-23 NOTE — Discharge Summary (Signed)
Patient ID: [name] MRN: [id number] DOB/AGE: 03-15-1941 78 y.o.  Admit date: 12/20/2018 Discharge date: 12/23/2018  Admission Diagnoses:  Principal Problem:   Unilateral primary osteoarthritis, right knee Active Problems:   Status post total right knee replacement   Discharge Diagnoses:  Same  Past Medical History:  Diagnosis Date  . Anxiety   . Arthritis    lower back, hip  . Back pain    generally "causes severe stiffness"  . Complication of anesthesia    "felt it made her feel very goofy for several monthS with general anesthetic"  . Depression   . GERD (gastroesophageal reflux disease)   . History of kidney stones 1988   x1 episode passed on own  . Hypertension   . 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 Osteopenia   . 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 female who was admitted 12/20/2018 for operative treatment ofUnilateral primary osteoarthritis, right knee. Patient has severe unremitting pain that affects sleep, daily activities, and work/hobbies. After pre-op clearance the patient was taken to the operating room on 12/20/2018 and underwent  Procedure(s): RIGHT TOTAL KNEE ARTHROPLASTY.    Patient was given perioperative antibiotics:  Anti-infectives (From admission, onward)   Start     Dose/Rate Route Frequency Ordered Stop   12/20/18 1600  ceFAZolin (ANCEF) IVPB 1 g/50 mL premix     1 g 100 mL/hr over 30 Minutes Intravenous Every 6 hours 12/20/18 1331 12/20/18 2241   12/20/18 0730  ceFAZolin (ANCEF) IVPB 2g/100 mL premix     2 g 200 mL/hr over 30 Minutes Intravenous On call to O.R. 12/20/18 1308 12/20/18 1019       Patient was given sequential compression devices, early ambulation, and chemoprophylaxis to prevent DVT.  Patient benefited maximally from hospital stay and there were no  complications.    Recent vital signs:  Patient Vitals for the past 24 hrs:  BP Temp Temp src Pulse Resp SpO2  12/23/18 0522 (!) 120/53 98.5 F (36.9 C) Oral 71 15 94 %  12/22/18 2021 129/60 98.5 F (36.9 C) Oral 76 - 100 %  12/22/18 1452 (!) 110/52 99.4 F (37.4 C) Oral 70 16 99 %     Recent laboratory studies:  Recent Labs    12/21/18 0404  WBC 10.6*  HGB 10.2*  HCT 32.4*  PLT 161  NA 136  K 3.8  CL 103  CO2 27  BUN 12  CREATININE 0.68  GLUCOSE 130*  CALCIUM 8.3*     Discharge Medications:   Allergies as of 12/23/2018      Reactions   Adhesive [tape] Itching, Rash      Medication List    STOP taking these medications   acetaminophen-codeine 300-30 MG tablet Commonly known as:  TYLENOL #3     TAKE these medications   ALPRAZolam 0.25 MG tablet Commonly known as:  XANAX Take 0.125 mg by mouth as needed for anxiety.   aspirin 81 MG chewable tablet Chew 1 tablet (81 mg total) by mouth 2 (two) times daily.   atorvastatin 10 MG tablet Commonly known as:  LIPITOR Take 10 mg by mouth daily at 6 PM.   CALCIUM + D3 PO Take 1 tablet by mouth daily.   calcium carbonate 500 MG chewable tablet Commonly known as:  TUMS - dosed in mg elemental  calcium Chew 1 tablet by mouth 2 (two) times daily as needed for indigestion or heartburn.   celecoxib 200 MG capsule Commonly known as:  CELEBREX TAKE (1) CAPSULE DAILY. What changed:  See the new instructions.   citalopram 40 MG tablet Commonly known as:  CELEXA Take 20 mg by mouth at bedtime.   docusate sodium 100 MG capsule Commonly known as:  COLACE Take 100 mg by mouth daily as needed for mild constipation.   hydrochlorothiazide 12.5 MG tablet Commonly known as:  HYDRODIURIL Take 12.5 mg by mouth every morning.   methocarbamol 500 MG tablet Commonly known as:  ROBAXIN Take 1 tablet (500 mg total) by mouth every 6 (six) hours as needed for muscle spasms.   multivitamin with minerals Tabs tablet Take 1  tablet by mouth daily.   oxyCODONE 5 MG immediate release tablet Commonly known as:  Oxy IR/ROXICODONE Take 1-2 tablets (5-10 mg total) by mouth every 4 (four) hours as needed for moderate pain (pain score 4-6).   SYSTANE OP Place 1 drop into both eyes daily as needed (For dry eyes.).            Durable Medical Equipment  (From admission, onward)         Start     Ordered   12/20/18 1332  DME 3 n 1  Once     12/20/18 1331   12/20/18 1332  DME Walker rolling  Once    Question:  Patient needs a walker to treat with the following condition  Answer:  Status post total right knee replacement   12/20/18 1331          Diagnostic Studies: Dg Knee Right Port  Result Date: 12/20/2018 CLINICAL DATA:  Status post right knee arthroplasty today. EXAM: PORTABLE RIGHT KNEE - 1-2 VIEW COMPARISON:  None. FINDINGS: Right knee replacement is identified without malalignment. Postsurgical changes including joint space fluid, air and skin staples are identified. IMPRESSION: Right knee replacement without malalignment. Electronically Signed   By: Abelardo Diesel M.D.   On: 12/20/2018 12:41    Disposition: Discharge disposition: 03-Skilled [place]    Mcarthur Rossetti, MD Follow up in 2 week(s).   Specialty:  Orthopedic Surgery Contact information: [place] [address] [phone]            Signed: Mcarthur Rossetti 12/23/2018, 7:22 AM

## 2018-12-23 NOTE — Progress Notes (Signed)
Subjective: 3 Days Post-Op Procedure(s) (LRB): RIGHT TOTAL KNEE ARTHROPLASTY (Right) Patient reports pain as moderate.  Does complain of constipation.  Objective: Vital signs in last 24 hours: Temp:  [98.5 F (36.9 C)-99.4 F (37.4 C)] 98.5 F (36.9 C) (02/24 0522) Pulse Rate:  [70-76] 71 (02/24 0522) Resp:  [15-16] 15 (02/24 0522) BP: (110-129)/(52-60) 120/53 (02/24 0522) SpO2:  [94 %-100 %] 94 % (02/24 0522)  Intake/Output from previous day: 02/23 0701 - 02/24 0700 In: 1287 [P.O.:1287] Out: -  Intake/Output this shift: No intake/output data recorded.  Recent Labs    12/21/18 0404  HGB 10.2*   Recent Labs    12/21/18 0404  WBC 10.6*  RBC 3.30*  HCT 32.4*  PLT 161   Recent Labs    12/21/18 0404  NA 136  K 3.8  CL 103  CO2 27  BUN 12  CREATININE 0.68  GLUCOSE 130*  CALCIUM 8.3*   No results for input(s): LABPT, INR in the last 72 hours.  Sensation intact distally Intact pulses distally Dorsiflexion/Plantar flexion intact Incision: scant drainage No cellulitis present Compartment soft   Assessment/Plan: 3 Days Post-Op Procedure(s) (LRB): RIGHT TOTAL KNEE ARTHROPLASTY (Right) Up with therapy Discharge to SNF this afternoon.      Mcarthur Rossetti 12/23/2018, 7:20 AM

## 2018-12-23 NOTE — Care Management Obs Status (Signed)
[facility] NOTIFICATION   Patient Details  Name: Kashayla Ungerer MRN: [id number] Date of Birth: 09-07-1941   Medicare Observation Status Notification Given:  Yes    Leeroy Cha, RN 12/23/2018, 10:07 AM

## 2018-12-23 NOTE — Progress Notes (Signed)
[facility] staff started the [employer].  CSW submitted clinicals to [facility] CSW will inform the medical team when the authorization is received.   Kathrin Greathouse, Marlinda Mike, MSW Clinical Social Worker  [phone] 12/23/2018  10:06 AM

## 2018-12-23 NOTE — Progress Notes (Signed)
Physical Therapy Treatment Patient Details Name: [name] MRN: [id number] DOB: [birth year] Today's Date: 12/23/2018    History of Present Illness Pt s/p R TKR and with hx of bil THR    PT Comments    Pt ambulated in hallway and performed LE exercises. Pt still requires some assist and has difficulty performing LE exercises without assistance.  Continue to recommend SNF upon d/c.   Follow Up Recommendations  SNF     Equipment Recommendations  None recommended by PT    Recommendations for Other Services       Precautions / Restrictions Precautions Precautions: Knee;Fall Precaution Comments: attempted mobility without KI, no buckling observed, unable to perform SLR however Required Braces or Orthoses: Knee Immobilizer - Right Knee Immobilizer - Right: Discontinue once straight leg raise with < 10 degree lag Restrictions Other Position/Activity Restrictions: WBAT    Mobility  Bed Mobility Overal bed mobility: Needs Assistance Bed Mobility: Sit to Supine     Supine to sit: Min assist     General bed mobility comments: pt in recliner  Transfers Overall transfer level: Needs assistance Equipment used: Rolling walker (2 wheeled) Transfers: Sit to/from Stand Sit to Stand: Min assist Stand pivot transfers: Min assist       General transfer comment: cues for LE management and use of UEs to self assist  Ambulation/Gait Ambulation/Gait assistance: Min assist Gait Distance (Feet): (hallway ambulation) Assistive device: Rolling walker (2 wheeled) Gait Pattern/deviations: Step-to pattern;Decreased step length - left;Decreased stance time - right     General Gait Details: cues for sequence, posture and position from RW, pt intiating heel strike, slight hip circumduction observed due to decreased knee flexion with swing phase   Stairs             Wheelchair Mobility    Modified Rankin (Stroke Patients Only)       Balance Overall balance assessment: Needs  assistance Sitting-balance support: Bilateral upper extremity supported       Standing balance support: Bilateral upper extremity supported Standing balance-Leahy Scale: Poor Standing balance comment: reliant on UEs for static and dynamic activity with flxal activity                             Cognition Arousal/Alertness: Awake/alert Behavior During Therapy: WFL for tasks assessed/performed Overall Cognitive Status: Within Functional Limits for tasks assessed                                        Exercises Total Joint Exercises Ankle Circles/Pumps: AROM;Both;15 reps;Supine Quad Sets: AROM;Both;Supine;15 reps Short Arc QuadSinclair Ship;Right;10 reps Heel Slides: AAROM;Right;Supine;10 reps Hip ABduction/ADduction: AAROM;Right;10 reps Straight Leg Raises: AAROM;Right;10 reps Knee Flexion: AAROM;Right;Seated;10 reps    General Comments        Pertinent Vitals/Pain Pain Assessment: 0-10 Pain Score: 3  Pain Location: R knee Pain Descriptors / Indicators: Aching;Sore Pain Intervention(s): Limited activity within patient's tolerance;Monitored during session;Repositioned;Ice applied    Home Living                      Prior Function            PT Goals (current goals can now be found in the care plan section) Progress towards PT goals: Progressing toward goals    Frequency    7X/week      PT Plan Current  plan remains appropriate    Co-evaluation              AM-PAC PT "6 Clicks" Mobility   Outcome Measure  Help needed turning from your back to your side while in a flat bed without using bedrails?: A Little Help needed moving from lying on your back to sitting on the side of a flat bed without using bedrails?: A Little Help needed moving to and from a bed to a chair (including a wheelchair)?: A Little Help needed standing up from a chair using your arms (e.g., wheelchair or bedside chair)?: A Little Help needed to walk in  hospital room?: A Little Help needed climbing 3-5 steps with a railing? : A Lot 6 Click Score: 17    End of Session   Activity Tolerance: Patient tolerated treatment well Patient left: in chair;with call bell/phone within reach   PT Visit Diagnosis: Difficulty in walking, not elsewhere classified (R26.2)     Time: 2575-0518 PT Time Calculation (min) (ACUTE ONLY): 23 min  Charges:  $Gait Training: 8-22 mins $Therapeutic Exercise: 8-22 mins                    Carmelia Bake, PT, DPT Acute Rehabilitation Services Office: [phone] Pager: [phone]  Trena Platt 12/23/2018, 1:22 PM

## 2018-12-23 NOTE — Progress Notes (Signed)
Occupational Therapy Treatment Patient Details Name: [name] MRN: [id number] DOB: [birth year] Today's Date: 12/23/2018    History of present illness Pt s/p R TKR and with hx of bil THR   OT comments  Pt making progress but does live alone and has no support.  Will need post acute rehab to increase I with ADL Activity to return home safely.   Follow Up Recommendations  SNF    Equipment Recommendations  None recommended by OT;3 in 1 bedside commode    Recommendations for Other Services      Precautions / Restrictions Precautions Precautions: Knee;Fall       Mobility Bed Mobility Overal bed mobility: Needs Assistance Bed Mobility: Sit to Supine     Supine to sit: Min assist        Transfers Overall transfer level: Needs assistance Equipment used: Rolling walker (2 wheeled) Transfers: Sit to/from [facility] Sit to Stand: Min assist Stand pivot transfers: Min assist            Balance Overall balance assessment: Needs assistance Sitting-balance support: Bilateral upper extremity supported       Standing balance support: Bilateral upper extremity supported Standing balance-Leahy Scale: Poor Standing balance comment: reliant on UEs for static and dynamic activity with flxal activity                            ADL either performed or assessed with clinical judgement   ADL Overall ADL's : Needs assistance/impaired                     Lower Body Dressing: Moderate assistance;Cueing for sequencing;Cueing for safety;Sit to/from stand Lower Body Dressing Details (indicate cue type and reason): LB dressing very challenging for pt.  May benefit from AE education Toilet Transfer: RW;Ambulation;Minimal assistance;Cueing for safety;Cueing for sequencing   Toileting- Clothing Manipulation and Hygiene: Minimal assistance;Sit to/from stand;Cueing for sequencing;Cueing for safety   Tub/ Shower Transfer: Moderate assistance;Walk-in  shower;Rolling walker;Adhering to hip precautions;Cueing for safety     General ADL Comments: Pt lives alone and will need post acute rehab to increase rehab to increase I with ADL activity     Vision Patient Visual Report: No change from baseline            Cognition Arousal/Alertness: Awake/alert Behavior During Therapy: WFL for tasks assessed/performed Overall Cognitive Status: Within Functional Limits for tasks assessed                                                     Pertinent Vitals/ Pain       Pain Score: 2  Pain Location: R knee Pain Descriptors / Indicators: Aching;Sore Pain Intervention(s): Limited activity within patient's tolerance;Monitored during session;Repositioned;Ice applied         Frequency  Min 2X/week        Progress Toward Goals  OT Goals(current goals can now be found in the care plan section)  Progress towards OT goals: Progressing toward goals     Plan Discharge plan remains appropriate       AM-PAC OT "6 Clicks" Daily Activity     Outcome Measure   Help from another person eating meals?: None Help from another person taking care of personal grooming?: A Little Help from another person toileting, which  includes using toliet, bedpan, or urinal?: A Little Help from another person bathing (including washing, rinsing, drying)?: A Little Help from another person to put on and taking off regular upper body clothing?: A Little Help from another person to put on and taking off regular lower body clothing?: A Lot 6 Click Score: 18    End of Session    OT Visit Diagnosis: Unsteadiness on feet (R26.81);Repeated falls (R29.6);Muscle weakness (generalized) (M62.81);Other abnormalities of gait and mobility (R26.89)   Activity Tolerance Patient tolerated treatment well   Patient Left in chair   Nurse Communication Mobility status        Time: 0940-1005 OT Time Calculation (min): 25 min  Charges: OT General  Charges $OT Visit: 1 Visit OT Treatments $Self Care/Home Management : 23-37 mins  Kari Baars, OT Acute Rehabilitation Services [phone] Office- [place], Edwena Felty D 12/23/2018, 12:19 PM

## 2018-12-24 DIAGNOSIS — M1711 Unilateral primary osteoarthritis, right knee: Secondary | ICD-10-CM | POA: Diagnosis not present

## 2018-12-24 NOTE — Progress Notes (Signed)
Physical Therapy Treatment Patient Details Name: [name] MRN: [id number] DOB: 22-Aug-1941 Today's Date: 12/24/2018    History of Present Illness Pt s/p R TKR and with hx of bil THR    PT Comments    Pt assisted with ambulating in hallway and performing LE exercises.  Pt still requires at least min assist at times so continue to recommend SNF for rehab to improve safety, mobility and strength in preparation for returning home alone.    Follow Up Recommendations  SNF     Equipment Recommendations  None recommended by PT    Recommendations for Other Services       Precautions / Restrictions Precautions Precautions: Knee;Fall Restrictions Other Position/Activity Restrictions: WBAT    Mobility  Bed Mobility Overal bed mobility: Needs Assistance Bed Mobility: Supine to Sit     Supine to sit: Min assist     General bed mobility comments: slight assist for LE  Transfers Overall transfer level: Needs assistance Equipment used: Rolling walker (2 wheeled) Transfers: Sit to/from Stand Sit to Stand: Min assist         General transfer comment: cues for LE management and use of UEs to self assist  Ambulation/Gait Ambulation/Gait assistance: Min assist;Min guard Gait Distance (Feet): (hallway ambulation) Assistive device: Rolling walker (2 wheeled) Gait Pattern/deviations: Step-to pattern;Decreased step length - left;Decreased stance time - right Gait velocity: decr   General Gait Details: cues for sequence, posture and position from RW, pt intiating heel strike, slight hip circumduction observed due to decreased knee flexion with swing phase   Stairs             Wheelchair Mobility    Modified Rankin (Stroke Patients Only)       Balance                                            Cognition Arousal/Alertness: Awake/alert Behavior During Therapy: WFL for tasks assessed/performed Overall Cognitive Status: Within Functional Limits  for tasks assessed                                        Exercises Total Joint Exercises Ankle Circles/Pumps: AROM;Both;15 reps;Supine Quad Sets: AROM;Both;Supine;15 reps Short Arc Quad: Right;10 reps;AROM Heel Slides: AAROM;Right;10 reps;Seated Hip ABduction/ADduction: AAROM;Right;10 reps Straight Leg Raises: AAROM;Right;10 reps Goniometric ROM: approx 55* AAROM R knee flexion after heel slides    General Comments        Pertinent Vitals/Pain Pain Assessment: 0-10 Pain Score: 3  Pain Location: R knee Pain Descriptors / Indicators: Aching;Sore Pain Intervention(s): Repositioned;Monitored during session;Ice applied    Home Living                      Prior Function            PT Goals (current goals can now be found in the care plan section) Progress towards PT goals: Progressing toward goals    Frequency    7X/week      PT Plan Current plan remains appropriate    Co-evaluation              AM-PAC PT "6 Clicks" Mobility   Outcome Measure  Help needed turning from your back to your side while in a flat bed without using bedrails?: A  Little Help needed moving from lying on your back to sitting on the side of a flat bed without using bedrails?: A Little Help needed moving to and from a bed to a chair (including a wheelchair)?: A Little Help needed standing up from a chair using your arms (e.g., wheelchair or bedside chair)?: A Little Help needed to walk in hospital room?: A Little Help needed climbing 3-5 steps with a railing? : A Lot 6 Click Score: 17    End of Session   Activity Tolerance: Patient tolerated treatment well Patient left: in chair;with call bell/phone within reach Nurse Communication: Mobility status PT Visit Diagnosis: Difficulty in walking, not elsewhere classified (R26.2)     Time: 8502-7741 PT Time Calculation (min) (ACUTE ONLY): 19 min  Charges:  $Gait Training: 8-22 mins                     Carmelia Bake, PT, DPT Acute Rehabilitation Services Office: [phone] Pager: [phone]  York Ram E 12/24/2018, 1:00 PM

## 2018-12-24 NOTE — Progress Notes (Signed)
CSW sat at bedside with the patient to provide support and reached out to [place].  Authorization is still pending at this time.   Kathrin Greathouse, Marlinda Mike, MSW Clinical Social Worker  [phone] 12/24/2018  12:25 PM

## 2018-12-24 NOTE — Progress Notes (Signed)
Patient ID: [name], female   DOB: 06/30/1941, 78 y.o.   MRN: [id number] No acute changes. Was discharged yesterday, but awaiting insurance approval for short-term skilled nursing placement.  Can be discharged again today.  Yesterday's discharge summary still should be good.

## 2018-12-25 DIAGNOSIS — M1711 Unilateral primary osteoarthritis, right knee: Secondary | ICD-10-CM | POA: Diagnosis not present

## 2018-12-25 MED ORDER — BISACODYL 10 MG RE SUPP: 10.0000 mg | Freq: Every day | RECTAL | Status: DC | PRN

## 2018-12-25 NOTE — Discharge Summary (Signed)
Patient ID: [name] MRN: [id number] DOB/AGE: [birth year] 80 y.o.  Admit date: 12/20/2018 Discharge date: 12/25/2018  Admission Diagnoses:  Principal Problem:   Unilateral primary osteoarthritis, right knee Active Problems:   Status post total right knee replacement   Discharge Diagnoses:  Same  Past Medical History:  Diagnosis Date  . Anxiety   . Arthritis    lower back, hip  . Back pain    generally "causes severe stiffness"  . Complication of anesthesia    "felt it made her feel very goofy for several monthS with general anesthetic"  . Depression   . GERD (gastroesophageal reflux disease)   . History of kidney stones 1988   x1 episode passed on own  . Hypertension   . 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 Osteopenia   . 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 female who was admitted 12/20/2018 for operative treatment ofUnilateral primary osteoarthritis, right knee. Patient has severe unremitting pain that affects sleep, daily activities, and work/hobbies. After pre-op clearance the patient was taken to the operating room on 12/20/2018 and underwent  Procedure(s): RIGHT TOTAL KNEE ARTHROPLASTY.    Patient was given perioperative antibiotics:  Anti-infectives (From admission, onward)   Start     Dose/Rate Route Frequency Ordered Stop   12/20/18 1600  ceFAZolin (ANCEF) IVPB 1 g/50 mL premix     1 g 100 mL/hr over 30 Minutes Intravenous Every 6 hours 12/20/18 1331 12/20/18 2241   12/20/18 0730  ceFAZolin (ANCEF) IVPB 2g/100 mL premix     2 g 200 mL/hr over 30 Minutes Intravenous On call to O.R. 12/20/18 5397 12/20/18 1019       Patient was given sequential compression devices, early ambulation, and chemoprophylaxis to prevent DVT.  Patient benefited maximally from hospital stay and there were no  complications.    Recent vital signs:  Patient Vitals for the past 24 hrs:  BP Temp Temp src Pulse Resp SpO2  12/25/18 0529 104/62 98.2 F (36.8 C) Oral 67 16 97 %  12/24/18 2102 126/65 98.4 F (36.9 C) Oral 72 16 99 %  12/24/18 1359 125/60 97.7 F (36.5 C) Oral 73 16 99 %  12/24/18 0905 (!) 128/45 - - 71 - -     Recent laboratory studies: No results for input(s): WBC, HGB, HCT, PLT, NA, K, CL, CO2, BUN, CREATININE, GLUCOSE, INR, CALCIUM in the last 72 hours.  Invalid input(s): PT, 2   Discharge Medications:   Allergies as of 12/25/2018      Reactions   Adhesive [tape] Itching, Rash      Medication List    STOP taking these medications   acetaminophen-codeine 300-30 MG tablet Commonly known as:  TYLENOL #3     TAKE these medications   ALPRAZolam 0.25 MG tablet Commonly known as:  XANAX Take 0.125 mg by mouth as needed for anxiety.   aspirin 81 MG chewable tablet Chew 1 tablet (81 mg total) by mouth 2 (two) times daily.   atorvastatin 10 MG tablet Commonly known as:  LIPITOR Take 10 mg by mouth daily at 6 PM.   CALCIUM + D3 PO Take 1 tablet by mouth daily.   calcium carbonate 500 MG chewable tablet Commonly known as:  TUMS - dosed in mg elemental calcium Chew 1 tablet by mouth 2 (two) times daily as  needed for indigestion or heartburn.   celecoxib 200 MG capsule Commonly known as:  CELEBREX TAKE (1) CAPSULE DAILY. What changed:  See the new instructions.   citalopram 40 MG tablet Commonly known as:  CELEXA Take 20 mg by mouth at bedtime.   docusate sodium 100 MG capsule Commonly known as:  COLACE Take 100 mg by mouth daily as needed for mild constipation.   hydrochlorothiazide 12.5 MG tablet Commonly known as:  HYDRODIURIL Take 12.5 mg by mouth every morning.   methocarbamol 500 MG tablet Commonly known as:  ROBAXIN Take 1 tablet (500 mg total) by mouth every 6 (six) hours as needed for muscle spasms.   multivitamin with minerals Tabs tablet Take  1 tablet by mouth daily.   oxyCODONE 5 MG immediate release tablet Commonly known as:  Oxy IR/ROXICODONE Take 1-2 tablets (5-10 mg total) by mouth every 4 (four) hours as needed for moderate pain (pain score 4-6).   SYSTANE OP Place 1 drop into both eyes daily as needed (For dry eyes.).            Durable Medical Equipment  (From admission, onward)         Start     Ordered   12/20/18 1332  DME 3 n 1  Once     12/20/18 1331   12/20/18 1332  DME Walker rolling  Once    Question:  Patient needs a walker to treat with the following condition  Answer:  Status post total right knee replacement   12/20/18 1331          Diagnostic Studies: Dg Knee Right Port  Result Date: 12/20/2018 CLINICAL DATA:  Status post right knee arthroplasty today. EXAM: PORTABLE RIGHT KNEE - 1-2 VIEW COMPARISON:  None. FINDINGS: Right knee replacement is identified without malalignment. Postsurgical changes including joint space fluid, air and skin staples are identified. IMPRESSION: Right knee replacement without malalignment. Electronically Signed   By: Abelardo Diesel M.D.   On: 12/20/2018 12:41    Disposition: Discharge disposition: 03-Skilled [place]    Mcarthur Rossetti, MD Follow up in 2 week(s).   Specialty:  Orthopedic Surgery Contact information: [place] [address] [phone]            Signed: Mcarthur Rossetti 12/25/2018, 7:16 AM

## 2018-12-25 NOTE — Plan of Care (Signed)
  Problem: Coping: Goal: Level of anxiety will decrease Outcome: Progressing   Problem: Elimination: Goal: Will not experience complications related to bowel motility Outcome: Progressing   Problem: Pain Managment: Goal: General experience of comfort will improve Outcome: Progressing   

## 2018-12-25 NOTE — Progress Notes (Signed)
Occupational Therapy Treatment Patient Details Name: Ainara Eldridge MRN: [id number] DOB: Jun 28, 1941 Today's Date: 12/25/2018    History of present illness Pt s/p R TKR and with hx of bil THR      Follow Up Recommendations  SNF    Equipment Recommendations  None recommended by OT;3 in 1 bedside commode    Recommendations for Other Services      Precautions / Restrictions Precautions Precautions: Knee;Fall Restrictions Other Position/Activity Restrictions: WBAT       Mobility Bed Mobility Overal bed mobility: Needs Assistance Bed Mobility: Supine to Sit     Supine to sit: Supervision        Transfers Overall transfer level: Needs assistance Equipment used: Rolling walker (2 wheeled) Transfers: Sit to/from [facility] Sit to Stand: Supervision Stand pivot transfers: Supervision       General transfer comment: cues for LE management and use of UEs to self assist    Balance Overall balance assessment: Needs assistance Sitting-balance support: Bilateral upper extremity supported Sitting balance-Leahy Scale: Good     Standing balance support: Bilateral upper extremity supported Standing balance-Leahy Scale: Fair Standing balance comment: reliant on UEs for static and dynamic activity with flxal activity                            ADL either performed or assessed with clinical judgement   ADL       Grooming: Wash/dry face;Oral care;Standing           Upper Body Dressing : Set up;Sitting   Lower Body Dressing: Minimal assistance;Sit to/from stand;Cueing for sequencing;Cueing for safety;Cueing for compensatory techniques   Toilet Transfer: Supervision/safety;RW;Ambulation;Cueing for safety;Cueing for sequencing   Toileting- Clothing Manipulation and Hygiene: Supervision/safety;Sit to/from stand               Vision Patient Visual Report: No change from baseline     Perception     Praxis      Cognition  Arousal/Alertness: Awake/alert Behavior During Therapy: WFL for tasks assessed/performed Overall Cognitive Status: Within Functional Limits for tasks assessed                                                     Pertinent Vitals/ Pain       Pain Score: 2  Pain Location: R knee Pain Descriptors / Indicators: Discomfort Pain Intervention(s): Limited activity within patient's tolerance;Monitored during session     Prior Functioning/Environment              Frequency  Min 2X/week        Progress Toward Goals  OT Goals(current goals can now be found in the care plan section)  Progress towards OT goals: Progressing toward goals     Plan Discharge plan remains appropriate       AM-PAC OT "6 Clicks" Daily Activity     Outcome Measure   Help from another person eating meals?: None Help from another person taking care of personal grooming?: A Little Help from another person toileting, which includes using toliet, bedpan, or urinal?: A Little Help from another person bathing (including washing, rinsing, drying)?: A Little Help from another person to put on and taking off regular upper body clothing?: A Little Help from another person to put on and taking off regular lower  body clothing?: A Little 6 Click Score: 19    End of Session Equipment Utilized During Treatment: Gait belt;Rolling walker  OT Visit Diagnosis: Unsteadiness on feet (R26.81);Repeated falls (R29.6);Muscle weakness (generalized) (M62.81);Other abnormalities of gait and mobility (R26.89)   Activity Tolerance Patient tolerated treatment well   Patient Left in chair   Nurse Communication Mobility status        Time: 0950-1009 OT Time Calculation (min): 19 min  Charges: OT General Charges $OT Visit: 1 Visit OT Treatments $Self Care/Home Management : 8-22 mins  Kari Baars, [place] [phone] Office- Progress, Edwena Felty  D 12/25/2018, 1:06 PM

## 2018-12-25 NOTE — Progress Notes (Signed)
Patient ID: [name], female   DOB: [birth year], 78 y.o.   MRN: [id number] Only in the hospital now due to the pending insurance approval for short-term skilled nursing.  Otherwise stable.

## 2018-12-25 NOTE — Clinical Social Work Placement (Signed)
[employer] Medicare Authorization received.  D/C Summary sent Nurse call report to: [phone] Room: 7012 Patient friend to transport.   CLINICAL SOCIAL WORK PLACEMENT  NOTE  Date:  12/25/2018  Patient Details  Name: [name] MRN: [id number] Date of Birth: Apr 11, 1941  Clinical Social Work is seeking post-discharge placement for this patient at the [place] level of care (*CSW will initial, date and re-position this form in  chart as items are completed):  Yes   Patient/family provided with [place] Work Department's list of facilities offering this level of care within the geographic area requested by the patient (or if unable, by the patient's family).  Yes   Patient/family informed of their freedom to choose among providers that offer the needed level of care, that participate in Medicare, Medicaid or managed care program needed by the patient, have an available bed and are willing to accept the patient.  Yes   Patient/family informed of [facility]'s ownership interest in [facility] and [facility], as well as of the fact that they are under no obligation to receive care at these facilities.  PASRR submitted to EDS on 12/22/18     PASRR number received on       Existing PASRR number confirmed on       FL2 transmitted to all facilities in geographic area requested by pt/family on 12/22/18     FL2 transmitted to all facilities within larger geographic area on       Patient informed that his/her managed care company has contracts with or will negotiate with certain facilities, including the following:        Yes   Patient/family informed of bed offers received.  Patient chooses bed at [facility] [place] recommends and patient chooses bed at [facility]    Patient to be transferred to [facility] [place] on 12/25/18.  Patient to be transferred to facility by Personal car     Patient family notified  on 12/25/18 of transfer.  Name of family member notified:  pt informing family     PHYSICIAN       Additional Comment:    _______________________________________________ Lia Hopping, LCSW 12/25/2018, 9:38 AM

## 2018-12-29 DIAGNOSIS — M8580 Other specified disorders of bone density and structure, unspecified site: Secondary | ICD-10-CM | POA: Diagnosis not present

## 2018-12-29 DIAGNOSIS — M1712 Unilateral primary osteoarthritis, left knee: Secondary | ICD-10-CM | POA: Diagnosis not present

## 2018-12-29 DIAGNOSIS — K219 Gastro-esophageal reflux disease without esophagitis: Secondary | ICD-10-CM | POA: Diagnosis not present

## 2018-12-29 DIAGNOSIS — Z96651 Presence of right artificial knee joint: Secondary | ICD-10-CM | POA: Diagnosis not present

## 2018-12-29 DIAGNOSIS — I10 Essential (primary) hypertension: Secondary | ICD-10-CM | POA: Diagnosis not present

## 2018-12-29 DIAGNOSIS — Z96641 Presence of right artificial hip joint: Secondary | ICD-10-CM | POA: Diagnosis not present

## 2018-12-29 DIAGNOSIS — Z471 Aftercare following joint replacement surgery: Secondary | ICD-10-CM | POA: Diagnosis not present

## 2018-12-29 DIAGNOSIS — R69 Illness, unspecified: Secondary | ICD-10-CM | POA: Diagnosis not present

## 2018-12-29 DIAGNOSIS — Z96642 Presence of left artificial hip joint: Secondary | ICD-10-CM | POA: Diagnosis not present

## 2019-01-02 ENCOUNTER — Encounter (INDEPENDENT_AMBULATORY_CARE_PROVIDER_SITE_OTHER): Payer: Self-pay | Admitting: Orthopaedic Surgery

## 2019-01-02 ENCOUNTER — Ambulatory Visit (INDEPENDENT_AMBULATORY_CARE_PROVIDER_SITE_OTHER): Payer: Medicare HMO | Admitting: Orthopaedic Surgery

## 2019-01-02 DIAGNOSIS — Z96651 Presence of right artificial knee joint: Secondary | ICD-10-CM

## 2019-01-02 NOTE — Progress Notes (Signed)
HPI: [name] returns today now 2 weeks status post right total hip arthroplasty.  She states pain is burning is getting therapy there.  She states she is just really stiff and sore.  She has had no fevers chills shortness of breath chest pain.  Physical exam: Right knee ankles well approximate the surgical incision.  There is no signs of infection.  No drainage.  Calf supple nontender.  Full extension flexion to 90 degrees.  Impression: 2 weeks status post right total knee arthroplasty  Plan: She will continue work on range of motion strengthening the knee.  Staples removed Steri-Strips applied.  She is able to get the wound wet in the shower.  She does continue on 81 mg aspirin once daily for a week and then discontinue as she was on no aspirin prior to surgery.  Follow-up with [place] in 1 month sooner if there is any questions concerns.

## 2019-01-07 ENCOUNTER — Telehealth (INDEPENDENT_AMBULATORY_CARE_PROVIDER_SITE_OTHER): Payer: Self-pay | Admitting: Orthopaedic Surgery

## 2019-01-07 NOTE — Telephone Encounter (Signed)
Pt called asking about pt at home.  She hasn't received a call or anything she would like to know how we can get that process started.

## 2019-01-07 NOTE — Telephone Encounter (Signed)
Tania with [facility] called stating she received a referral from [facility]. Tania advised patient went home yesterday and they have requested [place] to go out for nursing,Nursing PT,OT,and an AID. Tania said she need to verify that Dr. Ninfa Linden will be following these orders. The number to contact Tania is [phone]

## 2019-01-07 NOTE — Telephone Encounter (Signed)
LMOM for patient letting her know we are getting HHPT set up for her

## 2019-01-08 ENCOUNTER — Telehealth (INDEPENDENT_AMBULATORY_CARE_PROVIDER_SITE_OTHER): Payer: Self-pay | Admitting: Orthopaedic Surgery

## 2019-01-08 DIAGNOSIS — Z471 Aftercare following joint replacement surgery: Secondary | ICD-10-CM | POA: Diagnosis not present

## 2019-01-08 DIAGNOSIS — M47816 Spondylosis without myelopathy or radiculopathy, lumbar region: Secondary | ICD-10-CM | POA: Diagnosis not present

## 2019-01-08 DIAGNOSIS — K219 Gastro-esophageal reflux disease without esophagitis: Secondary | ICD-10-CM | POA: Diagnosis not present

## 2019-01-08 DIAGNOSIS — R69 Illness, unspecified: Secondary | ICD-10-CM | POA: Diagnosis not present

## 2019-01-08 DIAGNOSIS — Z79899 Other long term (current) drug therapy: Secondary | ICD-10-CM | POA: Diagnosis not present

## 2019-01-08 DIAGNOSIS — K5900 Constipation, unspecified: Secondary | ICD-10-CM | POA: Diagnosis not present

## 2019-01-08 DIAGNOSIS — Z96651 Presence of right artificial knee joint: Secondary | ICD-10-CM | POA: Diagnosis not present

## 2019-01-08 DIAGNOSIS — Z96643 Presence of artificial hip joint, bilateral: Secondary | ICD-10-CM | POA: Diagnosis not present

## 2019-01-08 DIAGNOSIS — I10 Essential (primary) hypertension: Secondary | ICD-10-CM | POA: Diagnosis not present

## 2019-01-08 DIAGNOSIS — Z87891 Personal history of nicotine dependence: Secondary | ICD-10-CM | POA: Diagnosis not present

## 2019-01-08 MED ORDER — OXYCODONE HCL 5 MG PO TABS: 5.0000 mg | ORAL_TABLET | ORAL | 0 refills | Status: DC | PRN

## 2019-01-08 NOTE — Telephone Encounter (Signed)
That will be fine. 

## 2019-01-08 NOTE — Telephone Encounter (Signed)
I will send some in. 

## 2019-01-08 NOTE — Telephone Encounter (Signed)
Patient called requesting an RX refill on her Oxycodone and the muscle relaxers.  She is just about out on both of them and wanted to know what Dr. Ninfa Linden wanted to give her for pain.  CB#[phone]

## 2019-01-08 NOTE — Telephone Encounter (Signed)
pls advise. Thanks.  

## 2019-01-08 NOTE — Telephone Encounter (Signed)
See message.

## 2019-01-08 NOTE — Telephone Encounter (Signed)
Patient aware Rx has been sent. 

## 2019-01-09 NOTE — Telephone Encounter (Signed)
IC LM advising.  ?

## 2019-01-10 DIAGNOSIS — K5900 Constipation, unspecified: Secondary | ICD-10-CM | POA: Diagnosis not present

## 2019-01-10 DIAGNOSIS — Z471 Aftercare following joint replacement surgery: Secondary | ICD-10-CM | POA: Diagnosis not present

## 2019-01-10 DIAGNOSIS — M47816 Spondylosis without myelopathy or radiculopathy, lumbar region: Secondary | ICD-10-CM | POA: Diagnosis not present

## 2019-01-10 DIAGNOSIS — K219 Gastro-esophageal reflux disease without esophagitis: Secondary | ICD-10-CM | POA: Diagnosis not present

## 2019-01-10 DIAGNOSIS — Z96643 Presence of artificial hip joint, bilateral: Secondary | ICD-10-CM | POA: Diagnosis not present

## 2019-01-10 DIAGNOSIS — Z87891 Personal history of nicotine dependence: Secondary | ICD-10-CM | POA: Diagnosis not present

## 2019-01-10 DIAGNOSIS — R69 Illness, unspecified: Secondary | ICD-10-CM | POA: Diagnosis not present

## 2019-01-10 DIAGNOSIS — I10 Essential (primary) hypertension: Secondary | ICD-10-CM | POA: Diagnosis not present

## 2019-01-10 DIAGNOSIS — Z96651 Presence of right artificial knee joint: Secondary | ICD-10-CM | POA: Diagnosis not present

## 2019-01-13 ENCOUNTER — Telehealth (INDEPENDENT_AMBULATORY_CARE_PROVIDER_SITE_OTHER): Payer: Self-pay | Admitting: Orthopaedic Surgery

## 2019-01-13 ENCOUNTER — Other Ambulatory Visit (INDEPENDENT_AMBULATORY_CARE_PROVIDER_SITE_OTHER): Payer: Self-pay

## 2019-01-13 DIAGNOSIS — K219 Gastro-esophageal reflux disease without esophagitis: Secondary | ICD-10-CM | POA: Diagnosis not present

## 2019-01-13 DIAGNOSIS — I10 Essential (primary) hypertension: Secondary | ICD-10-CM | POA: Diagnosis not present

## 2019-01-13 DIAGNOSIS — Z96643 Presence of artificial hip joint, bilateral: Secondary | ICD-10-CM | POA: Diagnosis not present

## 2019-01-13 DIAGNOSIS — Z96651 Presence of right artificial knee joint: Secondary | ICD-10-CM | POA: Diagnosis not present

## 2019-01-13 DIAGNOSIS — Z471 Aftercare following joint replacement surgery: Secondary | ICD-10-CM | POA: Diagnosis not present

## 2019-01-13 DIAGNOSIS — Z87891 Personal history of nicotine dependence: Secondary | ICD-10-CM | POA: Diagnosis not present

## 2019-01-13 DIAGNOSIS — K5900 Constipation, unspecified: Secondary | ICD-10-CM | POA: Diagnosis not present

## 2019-01-13 DIAGNOSIS — R69 Illness, unspecified: Secondary | ICD-10-CM | POA: Diagnosis not present

## 2019-01-13 DIAGNOSIS — M47816 Spondylosis without myelopathy or radiculopathy, lumbar region: Secondary | ICD-10-CM | POA: Diagnosis not present

## 2019-01-13 NOTE — Telephone Encounter (Signed)
Patient called asked if she can use a heating pad on the muscles of her knee (on the side)   Patient said her muscles are very tight. Patient asked if she can use Tylenol and Ibuprofen and what dosage if she can use both medications? Patient asked who arranges for her out patient therapy. The number to contact patient is [phone]

## 2019-01-13 NOTE — Telephone Encounter (Signed)
Patient aware heat is ok, but ice works better for inflammation Also that she may rotate ibuprofen and Tyelnol And I sent an order for outpatient therapy for her

## 2019-01-15 DIAGNOSIS — M47816 Spondylosis without myelopathy or radiculopathy, lumbar region: Secondary | ICD-10-CM | POA: Diagnosis not present

## 2019-01-15 DIAGNOSIS — I10 Essential (primary) hypertension: Secondary | ICD-10-CM | POA: Diagnosis not present

## 2019-01-15 DIAGNOSIS — K219 Gastro-esophageal reflux disease without esophagitis: Secondary | ICD-10-CM | POA: Diagnosis not present

## 2019-01-15 DIAGNOSIS — K5900 Constipation, unspecified: Secondary | ICD-10-CM | POA: Diagnosis not present

## 2019-01-15 DIAGNOSIS — Z96651 Presence of right artificial knee joint: Secondary | ICD-10-CM | POA: Diagnosis not present

## 2019-01-15 DIAGNOSIS — R69 Illness, unspecified: Secondary | ICD-10-CM | POA: Diagnosis not present

## 2019-01-15 DIAGNOSIS — Z96643 Presence of artificial hip joint, bilateral: Secondary | ICD-10-CM | POA: Diagnosis not present

## 2019-01-15 DIAGNOSIS — Z87891 Personal history of nicotine dependence: Secondary | ICD-10-CM | POA: Diagnosis not present

## 2019-01-15 DIAGNOSIS — Z471 Aftercare following joint replacement surgery: Secondary | ICD-10-CM | POA: Diagnosis not present

## 2019-01-17 DIAGNOSIS — M47816 Spondylosis without myelopathy or radiculopathy, lumbar region: Secondary | ICD-10-CM | POA: Diagnosis not present

## 2019-01-17 DIAGNOSIS — K5900 Constipation, unspecified: Secondary | ICD-10-CM | POA: Diagnosis not present

## 2019-01-17 DIAGNOSIS — Z96643 Presence of artificial hip joint, bilateral: Secondary | ICD-10-CM | POA: Diagnosis not present

## 2019-01-17 DIAGNOSIS — I10 Essential (primary) hypertension: Secondary | ICD-10-CM | POA: Diagnosis not present

## 2019-01-17 DIAGNOSIS — Z471 Aftercare following joint replacement surgery: Secondary | ICD-10-CM | POA: Diagnosis not present

## 2019-01-17 DIAGNOSIS — R69 Illness, unspecified: Secondary | ICD-10-CM | POA: Diagnosis not present

## 2019-01-17 DIAGNOSIS — K219 Gastro-esophageal reflux disease without esophagitis: Secondary | ICD-10-CM | POA: Diagnosis not present

## 2019-01-17 DIAGNOSIS — Z87891 Personal history of nicotine dependence: Secondary | ICD-10-CM | POA: Diagnosis not present

## 2019-01-17 DIAGNOSIS — Z96651 Presence of right artificial knee joint: Secondary | ICD-10-CM | POA: Diagnosis not present

## 2019-01-20 DIAGNOSIS — K5900 Constipation, unspecified: Secondary | ICD-10-CM | POA: Diagnosis not present

## 2019-01-20 DIAGNOSIS — M47816 Spondylosis without myelopathy or radiculopathy, lumbar region: Secondary | ICD-10-CM | POA: Diagnosis not present

## 2019-01-20 DIAGNOSIS — Z87891 Personal history of nicotine dependence: Secondary | ICD-10-CM | POA: Diagnosis not present

## 2019-01-20 DIAGNOSIS — I10 Essential (primary) hypertension: Secondary | ICD-10-CM | POA: Diagnosis not present

## 2019-01-20 DIAGNOSIS — Z96651 Presence of right artificial knee joint: Secondary | ICD-10-CM | POA: Diagnosis not present

## 2019-01-20 DIAGNOSIS — Z96643 Presence of artificial hip joint, bilateral: Secondary | ICD-10-CM | POA: Diagnosis not present

## 2019-01-20 DIAGNOSIS — K219 Gastro-esophageal reflux disease without esophagitis: Secondary | ICD-10-CM | POA: Diagnosis not present

## 2019-01-20 DIAGNOSIS — R69 Illness, unspecified: Secondary | ICD-10-CM | POA: Diagnosis not present

## 2019-01-20 DIAGNOSIS — Z471 Aftercare following joint replacement surgery: Secondary | ICD-10-CM | POA: Diagnosis not present

## 2019-01-22 DIAGNOSIS — Z96643 Presence of artificial hip joint, bilateral: Secondary | ICD-10-CM | POA: Diagnosis not present

## 2019-01-22 DIAGNOSIS — Z471 Aftercare following joint replacement surgery: Secondary | ICD-10-CM | POA: Diagnosis not present

## 2019-01-22 DIAGNOSIS — R69 Illness, unspecified: Secondary | ICD-10-CM | POA: Diagnosis not present

## 2019-01-22 DIAGNOSIS — K5900 Constipation, unspecified: Secondary | ICD-10-CM | POA: Diagnosis not present

## 2019-01-22 DIAGNOSIS — Z87891 Personal history of nicotine dependence: Secondary | ICD-10-CM | POA: Diagnosis not present

## 2019-01-22 DIAGNOSIS — K219 Gastro-esophageal reflux disease without esophagitis: Secondary | ICD-10-CM | POA: Diagnosis not present

## 2019-01-22 DIAGNOSIS — M47816 Spondylosis without myelopathy or radiculopathy, lumbar region: Secondary | ICD-10-CM | POA: Diagnosis not present

## 2019-01-22 DIAGNOSIS — Z96651 Presence of right artificial knee joint: Secondary | ICD-10-CM | POA: Diagnosis not present

## 2019-01-22 DIAGNOSIS — I10 Essential (primary) hypertension: Secondary | ICD-10-CM | POA: Diagnosis not present

## 2019-01-23 ENCOUNTER — Ambulatory Visit: Payer: Medicare HMO | Admitting: Physical Therapy

## 2019-01-24 ENCOUNTER — Ambulatory Visit: Payer: Medicare HMO | Attending: Orthopaedic Surgery | Admitting: Physical Therapy

## 2019-01-24 ENCOUNTER — Other Ambulatory Visit: Payer: Self-pay

## 2019-01-24 ENCOUNTER — Encounter: Payer: Self-pay | Admitting: Physical Therapy

## 2019-01-24 DIAGNOSIS — M25561 Pain in right knee: Secondary | ICD-10-CM | POA: Diagnosis not present

## 2019-01-24 DIAGNOSIS — R262 Difficulty in walking, not elsewhere classified: Secondary | ICD-10-CM

## 2019-01-24 DIAGNOSIS — M25661 Stiffness of right knee, not elsewhere classified: Secondary | ICD-10-CM

## 2019-01-24 NOTE — Patient Instructions (Signed)
Access Code: [email or web address]  URL: [email or web address]  Date: 01/24/2019  Prepared by: Elsie Ra   Exercises  Seated Hamstring Stretch - 3 sets - 30 hold - 2x daily - 6x weekly  Supine Quad Set - 10 reps - 2-3 sets - 5 sec hold - 2x daily - 6x weekly  Supine Heel Slide with Strap - 10 reps - 2-3 sets - 5 hold - 2x daily - 6x weekly  Standing Marching - 10 reps - 1-3 sets - 2x daily - 6x weekly  Sit to Stand - 10 reps - 1-2 sets - 2x daily - 6x weekly  Standing Knee Flexion Stretch on Step - 10 reps - 1 sets - 10 hold - 2x daily - 6x weekly

## 2019-01-25 NOTE — Therapy (Signed)
[place], [place], [address] Phone: [phone]   Fax:  [phone]  Physical Therapy Evaluation  Patient Details  Name: [name] MRN: [id number] Date of Birth: 1940/12/10 Referring Provider (PT): Mcarthur Rossetti, MD   Encounter Date: 01/24/2019  PT End of Session - 01/25/19 1329    Visit Number  1    Number of Visits  12    Date for PT Re-Evaluation  03/08/19    Authorization Type  aetna MCR, progress visit at 40    PT Start Time  1015    PT Stop Time  1105    PT Time Calculation (min)  50 min    Activity Tolerance  Patient tolerated treatment well    Behavior During Therapy  [facility] for tasks assessed/performed       Past Medical History:  Diagnosis Date  . Anxiety   . Arthritis    lower back, hip  . Back pain    generally "causes severe stiffness"  . Complication of anesthesia    "felt it made her feel very goofy for several monthS with general anesthetic"  . Depression   . GERD (gastroesophageal reflux disease)   . History of kidney stones 1988   x1 episode passed on own  . Hypertension   . 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 Osteopenia   . 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 APPENDECTOMY  2010   open  . BREAST LUMPECTOMY Left 1961   benign tumor  . TOTAL HIP ARTHROPLASTY  04/19/2012   Procedure: TOTAL HIP ARTHROPLASTY ANTERIOR APPROACH;  Surgeon: Mcarthur Rossetti, MD;  [facility]: [facility];  Service: Orthopedics;  Laterality: Left;  Left Total Hip Arthroplasty  . TOTAL HIP ARTHROPLASTY Right 04/21/2016   Procedure: RIGHT TOTAL HIP ARTHROPLASTY ANTERIOR APPROACH;  Surgeon: Mcarthur Rossetti, MD;  [facility];  Service: Orthopedics;  Laterality: Right;  . TOTAL KNEE ARTHROPLASTY Right 12/20/2018   Procedure: RIGHT TOTAL KNEE ARTHROPLASTY;  Surgeon: Mcarthur Rossetti, MD;  [facility];  Service:  Orthopedics;  Laterality: Right;    There were no vitals filed for this visit.   Subjective Assessment - 01/24/19 1159    Subjective  Pt relays she had R TKA on 12/20/18 and then had HHPT, she now reports to outpt PT with primary complaint of knee stiffness    Pertinent History  KKX:FGHW Rt THA now with Rt leg longer and wears lift in Lt shoe 04/21/16, anx,backpain,dep,HTN,osteopenia    Limitations  Standing;Walking    How long can you sit comfortably?  not limited    How long can you stand comfortably?  30  min    How long can you walk comfortably?  .25 mile    Patient Stated Goals  get my knee more ROM and get back to hiking    Currently in Pain?  Yes    Pain Score  5     Pain Location  Knee    Pain Orientation  Right    Pain Descriptors / Indicators  Aching;Tightness    Pain Type  Surgical pain    Pain Radiating Towards  denies    Pain Onset  More than a month ago    Pain Frequency  Intermittent    Aggravating Factors   bending knee too much, standing too long    Pain Relieving Factors  ice, biofreze  [facility] PT Assessment - 01/25/19 1340      Assessment   Medical Diagnosis  Rt TKA    Referring Provider (PT)  Mcarthur Rossetti, MD    Onset Date/Surgical Date  12/20/18    Next MD Visit  4/220    Prior Therapy  HHPT finished up wed      Precautions   Precautions  None      [place] residence    Additional Comments  3 steps, no difficulty with these she reports      Prior Function   Level of [place]  Retired    Leisure  hiking, small part time work at [employer]   Overall Cognitive Status  Within Functional Limits for tasks assessed      Observation/Other Assessments   Focus on Therapeutic Outcomes (FOTO)   49% limited      AROM   AROM Assessment Site  Knee    Right/Left Knee  Right    Right Knee Extension  10    Right Knee Flexion  90      PROM   PROM  Assessment Site  Knee    Right/Left Knee  Right    Right Knee Extension  5    Right Knee Flexion  96      Strength   Overall Strength Comments  Rt knee and hip overall 4+/5 MMT, Lt LE 5/5 MMT both tested grossly in sitting      Flexibility   Soft Tissue Assessment /Muscle Length  --   tight H.S, and quads Rt leg     Palpation   Patella mobility  decreased    Palpation comment  mild TTP and edema anterior and posterior knee                Objective measurements completed on examination: See above findings.      [place] Adult PT Treatment/Exercise - 01/25/19 1340      Transfers   Transfers  Independent with all Transfers      Ambulation/Gait   Gait Comments  no AD needed, slower velocity, walks with increased knee stiffness evident with decreased knee and hip flexion in swing phase and decreased knee ext at stance phase      Exercises   Exercises  Knee/Hip      Knee/Hip Exercises: Stretches   Active Hamstring Stretch  Right;1 rep;30 seconds    Active Hamstring Stretch Limitations  sitting    Other Knee/Hip Stretches  heelslides 5 sec X 15 with strap      Knee/Hip Exercises: Aerobic   Recumbent Bike  5 min rocking for ROM then able to get revolutions the last min after seat adjusted back just a little      Manual Therapy   Manual therapy comments  knee PROM                  PT Long Term Goals - 01/25/19 1335      PT LONG TERM GOAL #1   Title  Pt will be I and compliant with HEP. (6 weeks 03/08/19)    Status  New      PT LONG TERM GOAL #2   Title  Pt will improve Rt knee ROM to 0-115 deg to improve functional abilities. (6 weeks 03/08/19)    Status  New      PT LONG TERM GOAL #3  Title  Pt will improve hip/knee strength to grossly 5/5 MMT tested in sitting. (6 weeks 03/08/19)    Status  New      PT LONG TERM GOAL #4   Title  Pt will be able to ambulate communtiy distances and walk on uneven terrain with [facility] gait pattern and return to walking  trails/hiking. (6 weeks 03/08/19)    Status  New      PT LONG TERM GOAL #5   Title  Pt will have less than 2/10 overall pain with usual activity. (6 weeks 03/08/19)    Status  New             Plan - 01/25/19 1331    Clinical Impression Statement  Pt presents with Rt TKA on 12/20/18. She now has limited ROM, decreased strength, increased pain, and decresased activity tolerance for standing ADL's. She will benefit from skilled PT to address her deficits.     Personal Factors and Comorbidities  Age;Comorbidity 1;Comorbidity 2;Comorbidity 3+    Comorbidities  HQI:ONGE Rt THA now with Rt leg longer and wears lift in Lt shoe 04/21/16, anx,backpain,dep,HTN,osteopenia    Examination-Activity Limitations  Squat;Stairs;Bend;Locomotion Level;Stand    Examination-Participation Restrictions  Laundry;Shop;Cleaning;Community Activity;Driving    Stability/Clinical Decision Making  Evolving/Moderate complexity    Clinical Decision Making  Moderate    Rehab Potential  Good    PT Frequency  2x / week    PT Duration  6 weeks    PT Treatment/Interventions  Cryotherapy;Electrical Stimulation;Iontophoresis 4mg /ml Dexamethasone;Moist Heat;Traction;Ultrasound;Gait training;Stair training;Therapeutic activities;Therapeutic exercise;Neuromuscular re-education;Passive range of motion;Dry needling;Manual techniques;Joint Manipulations;Taping    PT Next Visit Plan  knee ROM, progress functional strength and gait    PT Home Exercise Plan  [id number] , HSS, heel slides, knee flexion lunge stretch, sit to stands, stand march    Consulted and Agree with Plan of Care  Patient       Patient will benefit from skilled therapeutic intervention in order to improve the following deficits and impairments:  Decreased activity tolerance, Decreased endurance, Decreased range of motion, Abnormal gait, Decreased strength, Hypomobility, Difficulty walking, Increased edema, Impaired flexibility, Increased fascial restricitons,  Pain  Visit Diagnosis: Acute pain of right knee  Stiffness of right knee, not elsewhere classified  Difficulty in walking, not elsewhere classified     Problem List Patient Active Problem List   Diagnosis Date Noted  . 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 Chronic pain of left knee 09/06/2017  . Unilateral primary osteoarthritis, left knee 09/06/2017  . Chronic pain of right knee 09/06/2017  . Leg length difference, acquired 03/06/2017  . Low back pain 03/06/2017  . Unilateral primary osteoarthritis, right knee 12/06/2016  . 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 Degenerative arthritis of hip 04/19/2012  . Anxiety   . Cancer ([place])   . Depression   . Arthritis   . RENAL CALCULUS, RIGHT 12/15/2008  . ABDOMINAL PAIN, UNSPECIFIED SITE 12/15/2008  . NEPHROLITHIASIS, HX OF 12/15/2008  . ANXIETY DEPRESSION 10/29/2007  . CONTACT DERMATITIS DUE TO SOLAR RADIATION 10/29/2007  . OSTEOARTHROSIS, LOCAL, PRIMARY, [facility] SITE 08/16/2007    Silvestre Mesi 01/25/2019, 1:41 PM  [facility] [address] [place], [place], [address] Phone: [phone]   Fax:  [phone]  Name: [name] MRN: [id number] Date of Birth: 1940-11-08

## 2019-01-28 ENCOUNTER — Other Ambulatory Visit: Payer: Self-pay

## 2019-01-28 ENCOUNTER — Ambulatory Visit: Payer: Medicare HMO | Admitting: Physical Therapy

## 2019-01-28 ENCOUNTER — Encounter: Payer: Self-pay | Admitting: Physical Therapy

## 2019-01-28 DIAGNOSIS — R262 Difficulty in walking, not elsewhere classified: Secondary | ICD-10-CM

## 2019-01-28 DIAGNOSIS — M25661 Stiffness of right knee, not elsewhere classified: Secondary | ICD-10-CM

## 2019-01-28 DIAGNOSIS — M25561 Pain in right knee: Secondary | ICD-10-CM | POA: Diagnosis not present

## 2019-01-28 NOTE — Therapy (Signed)
[place], [place], [address] Phone: [phone]   Fax:  [phone]  Physical Therapy Treatment  Patient Details  Name: [name] MRN: [id number] Date of Birth: [birth year] Referring Provider (PT): Mcarthur Rossetti, MD   Encounter Date: 01/28/2019  PT End of Session - 01/28/19 0920    Visit Number  2    Number of Visits  12    Date for PT Re-Evaluation  03/08/19    Authorization Type  aetna MCR, progress visit at 69    PT Start Time  0915    PT Stop Time  1010    PT Time Calculation (min)  55 min    Activity Tolerance  Patient tolerated treatment well    Behavior During Therapy  [facility] for tasks assessed/performed       Past Medical History:  Diagnosis Date  . Anxiety   . Arthritis    lower back, hip  . Back pain    generally "causes severe stiffness"  . Complication of anesthesia    "felt it made her feel very goofy for several monthS with general anesthetic"  . Depression   . GERD (gastroesophageal reflux disease)   . History of kidney stones 1988   x1 episode passed on own  . Hypertension   . 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 Osteopenia   . 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 APPENDECTOMY  2010   open  . BREAST LUMPECTOMY Left 1961   benign tumor  . TOTAL HIP ARTHROPLASTY  04/19/2012   Procedure: TOTAL HIP ARTHROPLASTY ANTERIOR APPROACH;  Surgeon: Mcarthur Rossetti, MD;  [facility];  Service: Orthopedics;  Laterality: Left;  Left Total Hip Arthroplasty  . TOTAL HIP ARTHROPLASTY Right 04/21/2016   Procedure: RIGHT TOTAL HIP ARTHROPLASTY ANTERIOR APPROACH;  Surgeon: Mcarthur Rossetti, MD;  [facility];  Service: Orthopedics;  Laterality: Right;  . TOTAL KNEE ARTHROPLASTY Right 12/20/2018   Procedure: RIGHT TOTAL KNEE ARTHROPLASTY;  Surgeon: Mcarthur Rossetti, MD;  [facility];  Service:  Orthopedics;  Laterality: Right;    There were no vitals filed for this visit.  Subjective Assessment - 01/28/19 0919    Subjective  I have a weird gait pattern.  I am stiff, no pain.  Took Tylenol before I got here.  I am sleeping better and thats helping.     Currently in Pain?  No/denies          [facility] Adult PT Treatment/Exercise - 01/28/19 0001      Knee/Hip Exercises: Stretches   Knee: Self-Stretch to increase Flexion  Right;5 reps    Knee: Self-Stretch Limitations  multiple ways supine and sit     Other Knee/Hip Stretches  single knee to chest for Rt hip release       Knee/Hip Exercises: Aerobic   Recumbent Bike  5 min for ROM, no tension     Other Aerobic  about 3 min on bike done post for ROM, able to perform better and get full revolution yet with hip compensations       Knee/Hip Exercises: Standing   Hip Abduction  Stengthening;Both;1 set;15 reps    Lateral Step Up  Right;3 sets;Hand Hold: 2;Hand Hold: 1;Step Height: 6"    Lateral Step Up Limitations  variations , 2 sets on L side to prep     Forward Step Up Limitations  lunge  for ROM, prep    Wall Squat  1 set;10 reps    Other Standing Knee Exercises  wall slides for closed chain quad and hip activation       Knee/Hip Exercises: Seated   Long Arc Quad  Strengthening;Right;2 sets;10 reps;Weights    Long Arc Quad Weight  4 lbs.    Heel Slides  AAROM;Strengthening;Right;1 set;10 reps    Hamstring Curl  Right;1 set;15 reps    Hamstring Limitations  blue    Sit to Sand  1 set;10 reps;without UE support   from mat table      Knee/Hip Exercises: Supine   Bridges  Strengthening    Bridges Limitations  limited ROM     Straight Leg Raises  Strengthening;Right;1 set;10 reps    Straight Leg Raise with External Rotation  Strengthening;Right;1 set;10 reps      Modalities   Modalities  Cryotherapy      Cryotherapy   Number Minutes Cryotherapy  10 Minutes    Cryotherapy Location  Knee    Type of Cryotherapy  Ice pack       Manual Therapy   Manual therapy comments  light patellar mobs mostly to assess edema (which is minimal)                   PT Long Term Goals - 01/25/19 1335      PT LONG TERM GOAL #1   Title  Pt will be I and compliant with HEP. (6 weeks 03/08/19)    Status  New      PT LONG TERM GOAL #2   Title  Pt will improve Rt knee ROM to 0-115 deg to improve functional abilities. (6 weeks 03/08/19)    Status  New      PT LONG TERM GOAL #3   Title  Pt will improve hip/knee strength to grossly 5/5 MMT tested in sitting. (6 weeks 03/08/19)    Status  New      PT LONG TERM GOAL #4   Title  Pt will be able to ambulate communtiy distances and walk on uneven terrain with [facility] gait pattern and return to walking trails/hiking. (6 weeks 03/08/19)    Status  New      PT LONG TERM GOAL #5   Title  Pt will have less than 2/10 overall pain with usual activity. (6 weeks 03/08/19)    Status  New            Plan - 01/28/19 0948    Clinical Impression Statement  Patient with good exercise tolerance, she needs min cues overall.  Showed glute weakness with supine bridging.  Limited in Rt knee flexion today to 90 deg self stretched, pain with PROM to 95 deg. Motivated to continue and return next week for PT.      Personal Factors and Comorbidities  Age;Comorbidity 1;Comorbidity 2;Comorbidity 3+    Comorbidities  HER:DEYC Rt THA now with Rt leg longer and wears lift in Lt shoe 04/21/16, anx,backpain,dep,HTN,osteopenia    Examination-Activity Limitations  Squat;Stairs;Bend;Locomotion Level;Stand    Examination-Participation Restrictions  Laundry;Shop;Cleaning;Community Activity;Driving    PT Treatment/Interventions  Cryotherapy;Electrical Stimulation;Iontophoresis 4mg /ml Dexamethasone;Moist Heat;Traction;Ultrasound;Gait training;Stair training;Therapeutic activities;Therapeutic exercise;Neuromuscular re-education;Passive range of motion;Dry needling;Manual techniques;Joint Manipulations;Taping    PT Next  Visit Plan  knee ROM, progress functional strength and gait. Closed chain preferred.      PT Home Exercise Plan  [id number] , HSS, heel slides, knee flexion lunge stretch, sit to stands, stand march  Consulted and Agree with Plan of Care  Patient       Patient will benefit from skilled therapeutic intervention in order to improve the following deficits and impairments:  Decreased activity tolerance, Decreased endurance, Decreased range of motion, Abnormal gait, Decreased strength, Hypomobility, Difficulty walking, Increased edema, Impaired flexibility, Increased fascial restricitons, Pain  Visit Diagnosis: Acute pain of right knee  Stiffness of right knee, not elsewhere classified  Difficulty in walking, not elsewhere classified     Problem List Patient Active Problem List   Diagnosis Date Noted  . 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 Chronic pain of left knee 09/06/2017  . Unilateral primary osteoarthritis, left knee 09/06/2017  . Chronic pain of right knee 09/06/2017  . Leg length difference, acquired 03/06/2017  . Low back pain 03/06/2017  . Unilateral primary osteoarthritis, right knee 12/06/2016  . 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 Degenerative arthritis of hip 04/19/2012  . Anxiety   . Cancer ([place])   . Depression   . Arthritis   . RENAL CALCULUS, RIGHT 12/15/2008  . ABDOMINAL PAIN, UNSPECIFIED SITE 12/15/2008  . NEPHROLITHIASIS, HX OF 12/15/2008  . ANXIETY DEPRESSION 10/29/2007  . CONTACT DERMATITIS DUE TO SOLAR RADIATION 10/29/2007  . OSTEOARTHROSIS, LOCAL, PRIMARY, [facility] SITE 08/16/2007    Zakkary Thibault 01/28/2019, 10:07 AM  [facility] [address] [place], [place], [address] Phone: [phone]   Fax:  [phone]  Name: [name] MRN: [id number] Date of Birth: 10-18-1941  Raeford Razor, PT 01/28/19 10:07 AM Phone: [phone] Fax: [phone]

## 2019-01-30 ENCOUNTER — Ambulatory Visit: Payer: Medicare HMO | Attending: Orthopaedic Surgery | Admitting: Physical Therapy

## 2019-01-30 ENCOUNTER — Encounter (INDEPENDENT_AMBULATORY_CARE_PROVIDER_SITE_OTHER): Payer: Self-pay | Admitting: Orthopaedic Surgery

## 2019-01-30 ENCOUNTER — Other Ambulatory Visit: Payer: Self-pay

## 2019-01-30 ENCOUNTER — Ambulatory Visit (INDEPENDENT_AMBULATORY_CARE_PROVIDER_SITE_OTHER): Payer: Medicare HMO | Admitting: Orthopaedic Surgery

## 2019-01-30 DIAGNOSIS — M25561 Pain in right knee: Secondary | ICD-10-CM | POA: Diagnosis not present

## 2019-01-30 DIAGNOSIS — R262 Difficulty in walking, not elsewhere classified: Secondary | ICD-10-CM

## 2019-01-30 DIAGNOSIS — M25661 Stiffness of right knee, not elsewhere classified: Secondary | ICD-10-CM

## 2019-01-30 DIAGNOSIS — Z96651 Presence of right artificial knee joint: Secondary | ICD-10-CM

## 2019-01-30 MED ORDER — OXYCODONE HCL 5 MG PO TABS: 5.0000 mg | ORAL_TABLET | Freq: Four times a day (QID) | ORAL | 0 refills | Status: DC | PRN

## 2019-01-30 NOTE — Therapy (Signed)
[place], [place], [address] Phone: [phone]   Fax:  [phone]  Physical Therapy Treatment/MD progress note  Patient Details  Name: [name] MRN: [id number] Date of Birth: 1941/07/27 Referring Provider (PT): Mcarthur Rossetti, MD   Encounter Date: 01/30/2019  PT End of Session - 01/30/19 1125    Visit Number  3    Number of Visits  12    Date for PT Re-Evaluation  03/08/19    Authorization Type  aetna MCR, progress visit at 82    PT Start Time  1017    PT Stop Time  1110    PT Time Calculation (min)  53 min    Activity Tolerance  Patient tolerated treatment well    Behavior During Therapy  [facility] for tasks assessed/performed       Past Medical History:  Diagnosis Date  . Anxiety   . Arthritis    lower back, hip  . Back pain    generally "causes severe stiffness"  . Complication of anesthesia    "felt it made her feel very goofy for several monthS with general anesthetic"  . Depression   . GERD (gastroesophageal reflux disease)   . History of kidney stones 1988   x1 episode passed on own  . Hypertension   . 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 Osteopenia   . 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 APPENDECTOMY  2010   open  . BREAST LUMPECTOMY Left 1961   benign tumor  . TOTAL HIP ARTHROPLASTY  04/19/2012   Procedure: TOTAL HIP ARTHROPLASTY ANTERIOR APPROACH;  Surgeon: Mcarthur Rossetti, MD;  [facility];  Service: Orthopedics;  Laterality: Left;  Left Total Hip Arthroplasty  . TOTAL HIP ARTHROPLASTY Right 04/21/2016   Procedure: RIGHT TOTAL HIP ARTHROPLASTY ANTERIOR APPROACH;  Surgeon: Mcarthur Rossetti, MD;  [facility];  Service: Orthopedics;  Laterality: Right;  . TOTAL KNEE ARTHROPLASTY Right 12/20/2018   Procedure: RIGHT TOTAL KNEE ARTHROPLASTY;  Surgeon: Mcarthur Rossetti, MD;  [facility];  Service: Orthopedics;  Laterality: Right;    There were no vitals filed for this visit.  Subjective Assessment - 01/30/19 1037    Subjective  Relays her knee is stiff, I am a whimp but trying to do her exercises at home. Overall she feels she is improving    Pertinent History  GOT:LXBW Rt THA now with Rt leg longer and wears lift in Lt shoe 04/21/16, anx,backpain,dep,HTN,osteopenia    Patient Stated Goals  get my knee more ROM and get back to hiking    Currently in Pain?  Yes    Pain Score  6    no pain at rest, 6 during end ROM   Pain Location  Knee    Pain Orientation  Right    Pain Descriptors / Indicators  Aching;Tightness    Pain Type  Surgical pain         [facility] PT Assessment - 01/30/19 0001      Assessment   Medical Diagnosis  Rt TKA    Referring Provider (PT)  Mcarthur Rossetti, MD    Onset Date/Surgical Date  12/20/18    Next MD Visit  01/30/19      AROM   Right Knee Extension  9    Right Knee Flexion  94   AAROM with strap     PROM  Right Knee Extension  5    Right Knee Flexion  106      Strength   Overall Strength Comments  Rt knee 5/5 MMT, Rt hip 4+/5 grossly                   [facility] Adult PT Treatment/Exercise - 01/30/19 0001      Ambulation/Gait   Gait Comments  gait training in bars for march walking and heel strike then she was able to show good carryover with improved gait pattern showing improved knee flexion at swing phase and improved heel strike      Knee/Hip Exercises: Stretches   Passive Hamstring Stretch  Left;2 reps;30 seconds    Knee: Self-Stretch to increase Flexion  10 seconds;Right    Knee: Self-Stretch Limitations  15 reps each supine heelslides, and standing lunge stretch    Gastroc Stretch  Both;2 reps;30 seconds      Knee/Hip Exercises: Aerobic   Recumbent Bike  5 min for ROM, no tension    able to perform full revolutions after rocking 3 times     Knee/Hip Exercises: Standing   Forward Step Up Limitations   lunge for ROM      Manual Therapy   Manual therapy comments  patellar mobs, STM and scar massage (encouraged to do this at home), PROM, and knee flexion/extension mobs             PT Education - 01/30/19 1124    Education Details  gait training drills for home, and self massage techniques    Person(s) Educated  Patient    Methods  Explanation;Demonstration;Verbal cues    Comprehension  Verbalized understanding;Returned demonstration          PT Long Term Goals - 01/30/19 1128      PT LONG TERM GOAL #1   Title  Pt will be I and compliant with HEP. (6 weeks 03/08/19)    Status  On-going      PT LONG TERM GOAL #2   Title  Pt will improve Rt knee ROM to 0-115 deg to improve functional abilities. (6 weeks 03/08/19)    Status  On-going      PT LONG TERM GOAL #3   Title  Pt will improve hip/knee strength to grossly 5/5 MMT tested in sitting. (6 weeks 03/08/19)    Status  Partially Met      PT LONG TERM GOAL #4   Title  Pt will be able to ambulate communtiy distances and walk on uneven terrain with [facility] gait pattern and return to walking trails/hiking. (6 weeks 03/08/19)    Status  On-going      PT LONG TERM GOAL #5   Title  Pt will have less than 2/10 overall pain with usual activity. (6 weeks 03/08/19)    Status  On-going            Plan - 01/30/19 1125    Clinical Impression Statement  Pt is progressing well with PT, she has good strength and good balance. She does still lack some knee ROM but this is steadily improving. Gait pattern is improving after gait training today. Overall she is doing great with PT and will continue to benefit from PT to get back to full funcitonal abilities.     Rehab Potential  Good    PT Frequency  3x / week   2-3   PT Duration  6 weeks    PT Treatment/Interventions  Cryotherapy;Electrical Stimulation;Iontophoresis 39m/ml Dexamethasone;Moist Heat;Traction;Ultrasound;Gait  training;Stair training;Therapeutic activities;Therapeutic  exercise;Neuromuscular re-education;Passive range of motion;Dry needling;Manual techniques;Joint Manipulations;Taping    PT Next Visit Plan  knee ROM, progress functional strength and gait. Closed chain preferred.      PT Home Exercise Plan  [id number] , HSS, heel slides, knee flexion lunge stretch, sit to stands, stand march, added self massage, march walking, heel strike walking    Consulted and Agree with Plan of Care  Patient       Patient will benefit from skilled therapeutic intervention in order to improve the following deficits and impairments:     Visit Diagnosis: Acute pain of right knee  Stiffness of right knee, not elsewhere classified  Difficulty in walking, not elsewhere classified     Problem List Patient Active Problem List   Diagnosis Date Noted  . 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 Chronic pain of left knee 09/06/2017  . Unilateral primary osteoarthritis, left knee 09/06/2017  . Chronic pain of right knee 09/06/2017  . Leg length difference, acquired 03/06/2017  . Low back pain 03/06/2017  . Unilateral primary osteoarthritis, right knee 12/06/2016  . 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 Degenerative arthritis of hip 04/19/2012  . Anxiety   . Cancer ([place])   . Depression   . Arthritis   . RENAL CALCULUS, RIGHT 12/15/2008  . ABDOMINAL PAIN, UNSPECIFIED SITE 12/15/2008  . NEPHROLITHIASIS, HX OF 12/15/2008  . ANXIETY DEPRESSION 10/29/2007  . CONTACT DERMATITIS DUE TO SOLAR RADIATION 10/29/2007  . OSTEOARTHROSIS, LOCAL, PRIMARY, [facility] SITE 08/16/2007    Silvestre Mesi 01/30/2019, 11:29 AM  [facility] [address] [place], [place], [address] Phone: [phone]   Fax:  [phone]  Name: Lujuana Kapler MRN: [id number] Date of Birth: 1940/11/28

## 2019-01-30 NOTE — Progress Notes (Signed)
The patient is a very pleasant 78 year old female who is now 6 weeks status post a right total knee arthroplasty.  She is walking without assistive device.  She is in physical therapy and is making progress.  On exam she still has moderate swelling from her knee but no significant effusion.  Her right knee has almost full extension to about 95 degrees flexion.  She said in therapy they got her to 103 degrees of flexion.  Should continue increase her motion and aggressive therapy on her knee.  All question concerns were answered and addressed.  I do feel she is making progress and gave her encouragement.  She is barely taking type of pain medication but does wish to have some oxycodone to have on hand if therapy is pushing her heart and I agree with this as well.  We will see her back in 4 weeks to see how she is doing from a clinical exam standpoint but no x-rays are needed.

## 2019-01-31 ENCOUNTER — Ambulatory Visit: Payer: Medicare HMO | Admitting: Physical Therapy

## 2019-01-31 ENCOUNTER — Encounter: Payer: Self-pay | Admitting: Physical Therapy

## 2019-01-31 DIAGNOSIS — R262 Difficulty in walking, not elsewhere classified: Secondary | ICD-10-CM

## 2019-01-31 DIAGNOSIS — M25661 Stiffness of right knee, not elsewhere classified: Secondary | ICD-10-CM

## 2019-01-31 DIAGNOSIS — M25561 Pain in right knee: Secondary | ICD-10-CM

## 2019-01-31 NOTE — Therapy (Signed)
[place], [place], [address] Phone: [phone]   Fax:  [phone]  Physical Therapy Treatment  Patient Details  Name: [name] MRN: [id number] Date of Birth: 1941/09/02 Referring Provider (PT): Mcarthur Rossetti, MD   Encounter Date: 01/31/2019  PT End of Session - 01/31/19 1244    Visit Number  4    Number of Visits  12    Date for PT Re-Evaluation  03/08/19    Authorization Type  aetna MCR, progress visit at 83    PT Start Time  3354    PT Stop Time  1238    PT Time Calculation (min)  53 min    Activity Tolerance  Patient tolerated treatment well    Behavior During Therapy  [facility] for tasks assessed/performed       Past Medical History:  Diagnosis Date  . Anxiety   . Arthritis    lower back, hip  . Back pain    generally "causes severe stiffness"  . Complication of anesthesia    "felt it made her feel very goofy for several monthS with general anesthetic"  . Depression   . GERD (gastroesophageal reflux disease)   . History of kidney stones 1988   x1 episode passed on own  . Hypertension   . 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 Osteopenia   . 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 APPENDECTOMY  2010   open  . BREAST LUMPECTOMY Left 1961   benign tumor  . TOTAL HIP ARTHROPLASTY  04/19/2012   Procedure: TOTAL HIP ARTHROPLASTY ANTERIOR APPROACH;  Surgeon: Mcarthur Rossetti, MD;  Location: [facility];  Service: Orthopedics;  Laterality: Left;  Left Total Hip Arthroplasty  . TOTAL HIP ARTHROPLASTY Right 04/21/2016   Procedure: RIGHT TOTAL HIP ARTHROPLASTY ANTERIOR APPROACH;  Surgeon: Mcarthur Rossetti, MD;  Location: [facility];  Service: Orthopedics;  Laterality: Right;  . TOTAL KNEE ARTHROPLASTY Right 12/20/2018   Procedure: RIGHT TOTAL KNEE ARTHROPLASTY;  Surgeon: Mcarthur Rossetti, MD;  [facility]: [facility];  Service:  Orthopedics;  Laterality: Right;    There were no vitals filed for this visit.  Subjective Assessment - 01/31/19 1159    Subjective  Pt relays she has been practicing her HEP and knee no pain just continued knee stiffness    Pertinent History  TGY:BWLS Rt THA now with Rt leg longer and wears lift in Lt shoe 04/21/16, anx,backpain,dep,HTN,osteopenia    Patient Stated Goals  get my knee more ROM and get back to hiking    Currently in Pain?  No/denies                       [facility] Adult PT Treatment/Exercise - 01/31/19 1203      Ambulation/Gait   Pre-Gait Activities  pre gait of march up then heel strike (cycle motion) 20 reps , 2 sets followed by ambulating from carryover      Knee/Hip Exercises: Stretches   Passive Hamstring Stretch  Left;2 reps;30 seconds    Knee: Self-Stretch to increase Flexion  10 seconds;Right    Knee: Self-Stretch Limitations  15 reps each supine heelslides, and standing lunge stretch    Gastroc Stretch  Both;2 reps;30 seconds      Knee/Hip Exercises: Aerobic   Recumbent Bike  5 min for ROM, no tension    able to perform full revolutions after rocking  3 times     Knee/Hip Exercises: Machines for Strengthening   Total Gym Leg Press  omega leg press 35 lbs both feet  X15, then 15 lbs for Rt leg only  x15      Knee/Hip Exercises: Standing   Forward Step Up Limitations  lunge for ROM      Knee/Hip Exercises: Seated   Sit to Sand  15 reps;without UE support      Manual Therapy   Manual therapy comments  patellar mobs, STM and scar massage (encouraged to do this at home), PROM, and knee flexion/extension mobs                  PT Long Term Goals - 01/30/19 1128      PT LONG TERM GOAL #1   Title  Pt will be I and compliant with HEP. (6 weeks 03/08/19)    Status  On-going      PT LONG TERM GOAL #2   Title  Pt will improve Rt knee ROM to 0-115 deg to improve functional abilities. (6 weeks 03/08/19)    Status  On-going      PT LONG  TERM GOAL #3   Title  Pt will improve hip/knee strength to grossly 5/5 MMT tested in sitting. (6 weeks 03/08/19)    Status  Partially Met      PT LONG TERM GOAL #4   Title  Pt will be able to ambulate communtiy distances and walk on uneven terrain with [facility] gait pattern and return to walking trails/hiking. (6 weeks 03/08/19)    Status  On-going      PT LONG TERM GOAL #5   Title  Pt will have less than 2/10 overall pain with usual activity. (6 weeks 03/08/19)    Status  On-going            Plan - 01/31/19 1245    Clinical Impression Statement  Session again focused on knee ROM and gait with added strengthening exercises today with good tolerance. She continues to be stiff with her knee ROM and gait but overall this is improving with PT.     Rehab Potential  Good    PT Frequency  3x / week    PT Duration  6 weeks    PT Treatment/Interventions  Cryotherapy;Electrical Stimulation;Iontophoresis 63m/ml Dexamethasone;Moist Heat;Traction;Ultrasound;Gait training;Stair training;Therapeutic activities;Therapeutic exercise;Neuromuscular re-education;Passive range of motion;Dry needling;Manual techniques;Joint Manipulations;Taping    PT Next Visit Plan  knee ROM, progress functional strength and gait. Closed chain preferred.      PT Home Exercise Plan  [id number] , HSS, heel slides, knee flexion lunge stretch, sit to stands, stand march, added self massage, march walking, pre gait hip flexion with heel stike    Consulted and Agree with Plan of Care  Patient       Patient will benefit from skilled therapeutic intervention in order to improve the following deficits and impairments:  Decreased activity tolerance, Decreased endurance, Decreased range of motion, Abnormal gait, Decreased strength, Hypomobility, Difficulty walking, Increased edema, Impaired flexibility, Increased fascial restricitons, Pain  Visit Diagnosis: Acute pain of right knee  Stiffness of right knee, not elsewhere  classified  Difficulty in walking, not elsewhere classified     Problem List Patient Active Problem List   Diagnosis Date Noted  . 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 Chronic pain of left knee 09/06/2017  . Unilateral primary osteoarthritis, left knee 09/06/2017  . Chronic pain of right knee 09/06/2017  . Leg  length difference, acquired 03/06/2017  . Low back pain 03/06/2017  . Unilateral primary osteoarthritis, right knee 12/06/2016  . 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 Degenerative arthritis of hip 04/19/2012  . Anxiety   . Cancer ([place])   . Depression   . Arthritis   . RENAL CALCULUS, RIGHT 12/15/2008  . ABDOMINAL PAIN, UNSPECIFIED SITE 12/15/2008  . NEPHROLITHIASIS, HX OF 12/15/2008  . ANXIETY DEPRESSION 10/29/2007  . CONTACT DERMATITIS DUE TO SOLAR RADIATION 10/29/2007  . 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

## 2019-02-03 ENCOUNTER — Ambulatory Visit: Payer: Medicare HMO | Admitting: Physical Therapy

## 2019-02-03 ENCOUNTER — Other Ambulatory Visit: Payer: Self-pay

## 2019-02-03 DIAGNOSIS — M25661 Stiffness of right knee, not elsewhere classified: Secondary | ICD-10-CM | POA: Diagnosis not present

## 2019-02-03 DIAGNOSIS — R262 Difficulty in walking, not elsewhere classified: Secondary | ICD-10-CM | POA: Diagnosis not present

## 2019-02-03 DIAGNOSIS — M25561 Pain in right knee: Secondary | ICD-10-CM | POA: Diagnosis not present

## 2019-02-03 NOTE — Therapy (Signed)
[place], [place], [address] Phone: [phone]   Fax:  [phone]  Physical Therapy Treatment  Patient Details  Name: [name] MRN: [id number] Date of Birth: April 30, 1941 Referring Provider (PT): Mcarthur Rossetti, MD   Encounter Date: 02/03/2019  PT End of Session - 02/03/19 1126    Visit Number  5    Number of Visits  12    Date for PT Re-Evaluation  03/08/19    Authorization Type  aetna MCR, progress visit at 39    PT Start Time  5027    PT Stop Time  1105    PT Time Calculation (min)  50 min    Activity Tolerance  Patient tolerated treatment well    Behavior During Therapy  [facility] for tasks assessed/performed       Past Medical History:  Diagnosis Date  . Anxiety   . Arthritis    lower back, hip  . Back pain    generally "causes severe stiffness"  . Complication of anesthesia    "felt it made her feel very goofy for several monthS with general anesthetic"  . Depression   . GERD (gastroesophageal reflux disease)   . History of kidney stones 1988   x1 episode passed on own  . Hypertension   . 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 Osteopenia   . 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 APPENDECTOMY  2010   open  . BREAST LUMPECTOMY Left 1961   benign tumor  . TOTAL HIP ARTHROPLASTY  04/19/2012   Procedure: TOTAL HIP ARTHROPLASTY ANTERIOR APPROACH;  Surgeon: Mcarthur Rossetti, MD;  [facility];  Service: Orthopedics;  Laterality: Left;  Left Total Hip Arthroplasty  . TOTAL HIP ARTHROPLASTY Right 04/21/2016   Procedure: RIGHT TOTAL HIP ARTHROPLASTY ANTERIOR APPROACH;  Surgeon: Mcarthur Rossetti, MD;  [facility];  Service: Orthopedics;  Laterality: Right;  . TOTAL KNEE ARTHROPLASTY Right 12/20/2018   Procedure: RIGHT TOTAL KNEE ARTHROPLASTY;  Surgeon: Mcarthur Rossetti, MD;  [facility];  Service:  Orthopedics;  Laterality: Right;    There were no vitals filed for this visit.  Subjective Assessment - 02/03/19 1117    Subjective  my knee is a little stiff and swolen but its getting better    Pertinent History  XAJ:OINO Rt THA now with Rt leg longer and wears lift in Lt shoe 04/21/16, anx,backpain,dep,HTN,osteopenia    Currently in Pain?  No/denies         [facility] PT Assessment - 02/03/19 0001      Assessment   Medical Diagnosis  Rt TKA    Referring Provider (PT)  Mcarthur Rossetti, MD    Onset Date/Surgical Date  12/20/18    Next MD Visit  02/27/19      AROM   Right Knee Flexion  100      PROM   Right Knee Flexion  108                   [facility] Adult PT Treatment/Exercise - 02/03/19 1118      Knee/Hip Exercises: Stretches   Passive Hamstring Stretch  Left;2 reps;30 seconds    Knee: Self-Stretch to increase Flexion  10 seconds;Right    Knee: Self-Stretch Limitations  10 reps each sitting heelslides, and standing lunge stretch    Gastroc Stretch  Both;2 reps;30 seconds  Knee/Hip Exercises: Aerobic   Recumbent Bike  5 min for ROM, no tension    able to perform full revolutions after rocking 3 times     Knee/Hip Exercises: Standing   Hip Flexion  Right;Stengthening;15 reps;Knee bent    Hip Flexion Limitations  2.5 lb    Forward Step Up  Right;15 reps;Hand Hold: 0;Step Height: 6";Hand Hold: 1    Forward Step Up Limitations  lunge for ROM    SLS  Rt leg 2X30 sec      Knee/Hip Exercises: Seated   Sit to Sand  15 reps;without UE support      Manual Therapy   Manual therapy comments  patellar mobs, STM/IASTM and scar massage, PROM, and knee flexion/extension mobs                  PT Long Term Goals - 01/30/19 1128      PT LONG TERM GOAL #1   Title  Pt will be I and compliant with HEP. (6 weeks 03/08/19)    Status  On-going      PT LONG TERM GOAL #2   Title  Pt will improve Rt knee ROM to 0-115 deg to improve functional abilities. (6  weeks 03/08/19)    Status  On-going      PT LONG TERM GOAL #3   Title  Pt will improve hip/knee strength to grossly 5/5 MMT tested in sitting. (6 weeks 03/08/19)    Status  Partially Met      PT LONG TERM GOAL #4   Title  Pt will be able to ambulate communtiy distances and walk on uneven terrain with [facility] gait pattern and return to walking trails/hiking. (6 weeks 03/08/19)    Status  On-going      PT LONG TERM GOAL #5   Title  Pt will have less than 2/10 overall pain with usual activity. (6 weeks 03/08/19)    Status  On-going            Plan - 02/03/19 1127    Clinical Impression Statement  She is making good progress in all areas with ROM and strengthening but sill a little limited in these areas and will continue to benefit from skilled PT. IASTM added today to reduce soft tissue restrictions in anterior knee. Gait pattern is becoming more normal but still some edema present so she was encouraged to ice at home.    Comorbidities  DQQ:IWLN Rt THA now with Rt leg longer and wears lift in Lt shoe 04/21/16, anx,backpain,dep,HTN,osteopenia    Rehab Potential  Good    PT Frequency  3x / week    PT Duration  6 weeks    PT Treatment/Interventions  Cryotherapy;Electrical Stimulation;Iontophoresis 65m/ml Dexamethasone;Moist Heat;Traction;Ultrasound;Gait training;Stair training;Therapeutic activities;Therapeutic exercise;Neuromuscular re-education;Passive range of motion;Dry needling;Manual techniques;Joint Manipulations;Taping    PT Next Visit Plan  knee ROM, progress functional strength and gait. Closed chain preferred.      PT Home Exercise Plan  [id number] , HSS, heel slides, knee flexion lunge stretch, sit to stands, stand march, added self massage, march walking, pre gait hip flexion with heel stike    Consulted and Agree with Plan of Care  Patient       Patient will benefit from skilled therapeutic intervention in order to improve the following deficits and impairments:     Visit  Diagnosis: Acute pain of right knee  Stiffness of right knee, not elsewhere classified  Difficulty in walking, not elsewhere classified  Problem List Patient Active Problem List   Diagnosis Date Noted  . 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 Chronic pain of left knee 09/06/2017  . Unilateral primary osteoarthritis, left knee 09/06/2017  . Chronic pain of right knee 09/06/2017  . Leg length difference, acquired 03/06/2017  . Low back pain 03/06/2017  . Unilateral primary osteoarthritis, right knee 12/06/2016  . 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 Degenerative arthritis of hip 04/19/2012  . Anxiety   . Cancer ([place])   . Depression   . Arthritis   . RENAL CALCULUS, RIGHT 12/15/2008  . ABDOMINAL PAIN, UNSPECIFIED SITE 12/15/2008  . NEPHROLITHIASIS, HX OF 12/15/2008  . ANXIETY DEPRESSION 10/29/2007  . CONTACT DERMATITIS DUE TO SOLAR RADIATION 10/29/2007  . OSTEOARTHROSIS, LOCAL, PRIMARY, [facility] SITE 08/16/2007    Debbe Odea ,PT,DPT 02/03/2019, 11:29 AM  [facility] [address] [place], [place], [address] Phone: [phone]   Fax:  [phone]  Name: [name] MRN: [id number] Date of Birth: Oct 25, 1941

## 2019-02-05 ENCOUNTER — Ambulatory Visit: Payer: Medicare HMO | Admitting: Physical Therapy

## 2019-02-05 ENCOUNTER — Other Ambulatory Visit: Payer: Self-pay

## 2019-02-05 DIAGNOSIS — R262 Difficulty in walking, not elsewhere classified: Secondary | ICD-10-CM | POA: Diagnosis not present

## 2019-02-05 DIAGNOSIS — M25561 Pain in right knee: Secondary | ICD-10-CM | POA: Diagnosis not present

## 2019-02-05 DIAGNOSIS — M25661 Stiffness of right knee, not elsewhere classified: Secondary | ICD-10-CM

## 2019-02-05 NOTE — Therapy (Signed)
[place], [place], [address] Phone: [phone]   Fax:  [phone]  Physical Therapy Treatment  Patient Details  Name: Renu Asby MRN: [id number] Date of Birth: May 04, 1941 Referring Provider (PT): Mcarthur Rossetti, MD   Encounter Date: 02/05/2019  PT End of Session - 02/05/19 1226    Visit Number  6    Number of Visits  12    Date for PT Re-Evaluation  03/08/19    Authorization Type  aetna MCR, progress visit at 55    PT Start Time  1105    PT Stop Time  1205    PT Time Calculation (min)  60 min    Activity Tolerance  Patient tolerated treatment well    Behavior During Therapy  [facility] for tasks assessed/performed       Past Medical History:  Diagnosis Date  . Anxiety   . Arthritis    lower back, hip  . Back pain    generally "causes severe stiffness"  . Complication of anesthesia    "felt it made her feel very goofy for several monthS with general anesthetic"  . Depression   . GERD (gastroesophageal reflux disease)   . History of kidney stones 1988   x1 episode passed on own  . Hypertension   . 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 Osteopenia   . 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 APPENDECTOMY  2010   open  . BREAST LUMPECTOMY Left 1961   benign tumor  . TOTAL HIP ARTHROPLASTY  04/19/2012   Procedure: TOTAL HIP ARTHROPLASTY ANTERIOR APPROACH;  Surgeon: Mcarthur Rossetti, MD;  [facility];  Service: Orthopedics;  Laterality: Left;  Left Total Hip Arthroplasty  . TOTAL HIP ARTHROPLASTY Right 04/21/2016   Procedure: RIGHT TOTAL HIP ARTHROPLASTY ANTERIOR APPROACH;  Surgeon: Mcarthur Rossetti, MD;  [facility]: [facility];  Service: Orthopedics;  Laterality: Right;  . TOTAL KNEE ARTHROPLASTY Right 12/20/2018   Procedure: RIGHT TOTAL KNEE ARTHROPLASTY;  Surgeon: Mcarthur Rossetti, MD;  [facility];  Service:  Orthopedics;  Laterality: Right;    There were no vitals filed for this visit.  Subjective Assessment - 02/05/19 1114    Subjective  some "fullness and soreness in my knee but not pain"    Currently in Pain?  No/denies         [facility] PT Assessment - 02/05/19 0001      Assessment   Medical Diagnosis  Rt TKA    Referring Provider (PT)  Mcarthur Rossetti, MD    Onset Date/Surgical Date  12/20/18      AROM   Right Knee Flexion  100   AAROM                  [facility] Adult PT Treatment/Exercise - 02/05/19 0001      Knee/Hip Exercises: Stretches   Passive Hamstring Stretch  Left;2 reps;30 seconds    Knee: Self-Stretch to increase Flexion  10 seconds;Right    Knee: Self-Stretch Limitations  10 reps each sitting heelslides, and standing lunge stretch    Gastroc Stretch  Both;2 reps;30 seconds    Gastroc Stretch Limitations  slantboard      Knee/Hip Exercises: Aerobic   Recumbent Bike  5 min after 2 min able to turn bike on to set resistance L2      Knee/Hip Exercises: Machines for Strengthening   Cybex  Leg Press  Omega leg press Rt leg only 20 lbs X 20      Knee/Hip Exercises: Standing   Hip Flexion  Right;Stengthening;15 reps;Knee bent    Hip Flexion Limitations  3 lbs     Forward Step Up  Right;15 reps;Hand Hold: 0;Step Height: 6";Hand Hold: 1    Forward Step Up Limitations  with 3 lb wt on Rt leg for hip flexion strength at same time      Knee/Hip Exercises: Seated   Sit to Sand  2 sets;10 reps;without UE support   Lt leg further in front to shift more wt onto Rt leg     Knee/Hip Exercises: Supine   Straight Leg Raises  Right;1 set;15 reps      Modalities   Modalities  Vasopneumatic      Vasopneumatic   Number Minutes Vasopneumatic   15 minutes    Vasopnuematic Location   Knee    Vasopneumatic Pressure  Medium    Vasopneumatic Temperature   36      Manual Therapy   Manual therapy comments  patellar mobs, STM/IASTM and scar massage, PROM, and knee  flexion/extension mobs                  PT Long Term Goals - 01/30/19 1128      PT LONG TERM GOAL #1   Title  Pt will be I and compliant with HEP. (6 weeks 03/08/19)    Status  On-going      PT LONG TERM GOAL #2   Title  Pt will improve Rt knee ROM to 0-115 deg to improve functional abilities. (6 weeks 03/08/19)    Status  On-going      PT LONG TERM GOAL #3   Title  Pt will improve hip/knee strength to grossly 5/5 MMT tested in sitting. (6 weeks 03/08/19)    Status  Partially Met      PT LONG TERM GOAL #4   Title  Pt will be able to ambulate communtiy distances and walk on uneven terrain with [facility] gait pattern and return to walking trails/hiking. (6 weeks 03/08/19)    Status  On-going      PT LONG TERM GOAL #5   Title  Pt will have less than 2/10 overall pain with usual activity. (6 weeks 03/08/19)    Status  On-going            Plan - 02/05/19 1226    Clinical Impression Statement  Pt is progressing well with strength and ROM which was again her focus today. She does continue to have swelling in her knee so she received vaso at end of session to help with this. PT will continue to progress as able toward her functional goals. She remains very motivated with PT.     Rehab Potential  Good    PT Frequency  3x / week    PT Duration  6 weeks    PT Treatment/Interventions  Cryotherapy;Electrical Stimulation;Iontophoresis 27m/ml Dexamethasone;Moist Heat;Traction;Ultrasound;Gait training;Stair training;Therapeutic activities;Therapeutic exercise;Neuromuscular re-education;Passive range of motion;Dry needling;Manual techniques;Joint Manipulations;Taping    PT Next Visit Plan  knee ROM, progress functional strength and gait. Closed chain preferred.      PT Home Exercise Plan  [id number] , HSS, heel slides, knee flexion lunge stretch, sit to stands, stand march, added self massage, march walking, pre gait hip flexion with heel stike    Consulted and Agree with Plan of Care  Patient        Patient  will benefit from skilled therapeutic intervention in order to improve the following deficits and impairments:     Visit Diagnosis: Acute pain of right knee  Stiffness of right knee, not elsewhere classified  Difficulty in walking, not elsewhere classified     Problem List Patient Active Problem List   Diagnosis Date Noted  . 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 Chronic pain of left knee 09/06/2017  . Unilateral primary osteoarthritis, left knee 09/06/2017  . Chronic pain of right knee 09/06/2017  . Leg length difference, acquired 03/06/2017  . Low back pain 03/06/2017  . Unilateral primary osteoarthritis, right knee 12/06/2016  . 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 Degenerative arthritis of hip 04/19/2012  . Anxiety   . Cancer ([place])   . Depression   . Arthritis   . RENAL CALCULUS, RIGHT 12/15/2008  . ABDOMINAL PAIN, UNSPECIFIED SITE 12/15/2008  . NEPHROLITHIASIS, HX OF 12/15/2008  . ANXIETY DEPRESSION 10/29/2007  . CONTACT DERMATITIS DUE TO SOLAR RADIATION 10/29/2007  . OSTEOARTHROSIS, LOCAL, PRIMARY, [facility] SITE 08/16/2007    Silvestre Mesi 02/05/2019, 12:28 PM  [facility] [address] [place], [place], [address] Phone: [phone]   Fax:  [phone]  Name: Charlese Gruetzmacher MRN: [id number] Date of Birth: [birth year]

## 2019-02-07 ENCOUNTER — Ambulatory Visit: Payer: Medicare HMO | Admitting: Physical Therapy

## 2019-02-07 ENCOUNTER — Other Ambulatory Visit: Payer: Self-pay

## 2019-02-07 ENCOUNTER — Encounter: Payer: Self-pay | Admitting: Physical Therapy

## 2019-02-07 DIAGNOSIS — M25661 Stiffness of right knee, not elsewhere classified: Secondary | ICD-10-CM

## 2019-02-07 DIAGNOSIS — R262 Difficulty in walking, not elsewhere classified: Secondary | ICD-10-CM

## 2019-02-07 DIAGNOSIS — M25561 Pain in right knee: Secondary | ICD-10-CM

## 2019-02-07 NOTE — Therapy (Signed)
[place], [place], [address] Phone: [phone]   Fax:  [phone]  Physical Therapy Treatment  Patient Details  Name: [name] MRN: [id number] Date of Birth: 12-25-1940 Referring Provider (PT): Mcarthur Rossetti, MD   Encounter Date: 02/07/2019  PT End of Session - 02/07/19 1109    Visit Number  7    Number of Visits  12    Date for PT Re-Evaluation  03/08/19    Authorization Type  aetna MCR, progress visit at 85    PT Start Time  1103    PT Stop Time  1200    PT Time Calculation (min)  57 min    Activity Tolerance  Patient tolerated treatment well    Behavior During Therapy  [facility] for tasks assessed/performed       Past Medical History:  Diagnosis Date  . Anxiety   . Arthritis    lower back, hip  . Back pain    generally "causes severe stiffness"  . Complication of anesthesia    "felt it made her feel very goofy for several monthS with general anesthetic"  . Depression   . GERD (gastroesophageal reflux disease)   . History of kidney stones 1988   x1 episode passed on own  . Hypertension   . 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 Osteopenia   . 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 APPENDECTOMY  2010   open  . BREAST LUMPECTOMY Left 1961   benign tumor  . TOTAL HIP ARTHROPLASTY  04/19/2012   Procedure: TOTAL HIP ARTHROPLASTY ANTERIOR APPROACH;  Surgeon: Mcarthur Rossetti, MD;  Location: [facility];  Service: Orthopedics;  Laterality: Left;  Left Total Hip Arthroplasty  . TOTAL HIP ARTHROPLASTY Right 04/21/2016   Procedure: RIGHT TOTAL HIP ARTHROPLASTY ANTERIOR APPROACH;  Surgeon: Mcarthur Rossetti, MD;  Location: [facility];  Service: Orthopedics;  Laterality: Right;  . TOTAL KNEE ARTHROPLASTY Right 12/20/2018   Procedure: RIGHT TOTAL KNEE ARTHROPLASTY;  Surgeon: Mcarthur Rossetti, MD;  Location: [facility];  Service:  Orthopedics;  Laterality: Right;    There were no vitals filed for this visit.  Subjective Assessment - 02/07/19 1107    Subjective  Patient reports her knee is stiff this morning. She tried warming up but it was difficult.     Pertinent History  YJE:HUDJ Rt THA now with Rt leg longer and wears lift in Lt shoe 04/21/16, anx,backpain,dep,HTN,osteopenia    Limitations  Standing;Walking    How long can you sit comfortably?  not limited    How long can you stand comfortably?  30  min    How long can you walk comfortably?  .25 mile    Patient Stated Goals  get my knee more ROM and get back to hiking    Currently in Pain?  No/denies    Pain Score  5     Pain Location  Knee    Pain Orientation  Right    Pain Descriptors / Indicators  Aching;Tightness    Pain Type  Surgical pain    Pain Onset  More than a month ago    Pain Frequency  Intermittent    Aggravating Factors   bending the knee too much    Pain Relieving Factors  ice  [place] Adult PT Treatment/Exercise - 02/07/19 0001      Knee/Hip Exercises: Stretches   Passive Hamstring Stretch  Left;2 reps;30 seconds      Knee/Hip Exercises: Standing   Hip Flexion  Right;Stengthening;15 reps;Knee bent    Hip Flexion Limitations  3 lbs     Forward Step Up  Right;15 reps;Hand Hold: 0;Step Height: 6";Hand Hold: 1    Forward Step Up Limitations  with 3 lb wt on Rt leg for hip flexion strength at same time    Functional Squat  20 reps    Functional Squat Limitations  cuing for technique but picked up quickly     SLS  Rt leg 2X30 sec      Knee/Hip Exercises: Supine   Straight Leg Raises  Right;1 set;15 reps      Vasopneumatic   Number Minutes Vasopneumatic   15 minutes    Vasopnuematic Location   Knee    Vasopneumatic Pressure  Medium    Vasopneumatic Temperature   36             PT Education - 02/07/19 1108    Education Details  reviewed HEp and symptom mangement     Person(s) Educated   Patient    Methods  Explanation;Demonstration;Tactile cues;Verbal cues    Comprehension  Verbalized understanding;Returned demonstration;Verbal cues required;Tactile cues required          PT Long Term Goals - 01/30/19 1128      PT LONG TERM GOAL #1   Title  Pt will be I and compliant with HEP. (6 weeks 03/08/19)    Status  On-going      PT LONG TERM GOAL #2   Title  Pt will improve Rt knee ROM to 0-115 deg to improve functional abilities. (6 weeks 03/08/19)    Status  On-going      PT LONG TERM GOAL #3   Title  Pt will improve hip/knee strength to grossly 5/5 MMT tested in sitting. (6 weeks 03/08/19)    Status  Partially Met      PT LONG TERM GOAL #4   Title  Pt will be able to ambulate communtiy distances and walk on uneven terrain with [facility] gait pattern and return to walking trails/hiking. (6 weeks 03/08/19)    Status  On-going      PT LONG TERM GOAL #5   Title  Pt will have less than 2/10 overall pain with usual activity. (6 weeks 03/08/19)    Status  On-going            Plan - 02/07/19 1242    Clinical Impression Statement  Patient is making good progress. She had no increase in pain with ther-ex. She was stiffer today. Total range measured at 0-105. She reported 111 last week. She will continue working on stretching over the weekend. Therapy added a functional squat.     Personal Factors and Comorbidities  Age;Comorbidity 1;Comorbidity 2;Comorbidity 3+    Comorbidities  GNF:AOZH Rt THA now with Rt leg longer and wears lift in Lt shoe 04/21/16, anx,backpain,dep,HTN,osteopenia    Stability/Clinical Decision Making  Evolving/Moderate complexity    PT Frequency  3x / week    PT Duration  6 weeks    PT Treatment/Interventions  Cryotherapy;Electrical Stimulation;Iontophoresis 73m/ml Dexamethasone;Moist Heat;Traction;Ultrasound;Gait training;Stair training;Therapeutic activities;Therapeutic exercise;Neuromuscular re-education;Passive range of motion;Dry needling;Manual  techniques;Joint Manipulations;Taping    PT Next Visit Plan  knee ROM, progress functional strength and gait. Closed chain preferred.      PT Home  Exercise Plan  [id number] , HSS, heel slides, knee flexion lunge stretch, sit to stands, stand march, added self massage, march walking, pre gait hip flexion with heel stike    Consulted and Agree with Plan of Care  Patient       Patient will benefit from skilled therapeutic intervention in order to improve the following deficits and impairments:  Decreased activity tolerance, Decreased endurance, Decreased range of motion, Abnormal gait, Decreased strength, Hypomobility, Difficulty walking, Increased edema, Impaired flexibility, Increased fascial restricitons, Pain  Visit Diagnosis: Acute pain of right knee  Stiffness of right knee, not elsewhere classified  Difficulty in walking, not elsewhere classified     Problem List Patient Active Problem List   Diagnosis Date Noted  . 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 Chronic pain of left knee 09/06/2017  . Unilateral primary osteoarthritis, left knee 09/06/2017  . Chronic pain of right knee 09/06/2017  . Leg length difference, acquired 03/06/2017  . Low back pain 03/06/2017  . Unilateral primary osteoarthritis, right knee 12/06/2016  . 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 Degenerative arthritis of hip 04/19/2012  . Anxiety   . Cancer ([place])   . Depression   . Arthritis   . RENAL CALCULUS, RIGHT 12/15/2008  . ABDOMINAL PAIN, UNSPECIFIED SITE 12/15/2008  . NEPHROLITHIASIS, HX OF 12/15/2008  . ANXIETY DEPRESSION 10/29/2007  . CONTACT DERMATITIS DUE TO SOLAR RADIATION 10/29/2007  . 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

## 2019-02-10 ENCOUNTER — Encounter: Payer: Self-pay | Admitting: Physical Therapy

## 2019-02-10 ENCOUNTER — Ambulatory Visit: Payer: Medicare HMO | Admitting: Physical Therapy

## 2019-02-10 ENCOUNTER — Other Ambulatory Visit: Payer: Self-pay

## 2019-02-10 DIAGNOSIS — M25661 Stiffness of right knee, not elsewhere classified: Secondary | ICD-10-CM | POA: Diagnosis not present

## 2019-02-10 DIAGNOSIS — R262 Difficulty in walking, not elsewhere classified: Secondary | ICD-10-CM

## 2019-02-10 DIAGNOSIS — M25561 Pain in right knee: Secondary | ICD-10-CM

## 2019-02-10 NOTE — Therapy (Signed)
[place], [place], [address] Phone: [phone]   Fax:  [phone]  Physical Therapy Treatment  Patient Details  Name: [name] MRN: [id number] Date of Birth: 1941/03/20 Referring Provider (PT): Mcarthur Rossetti, MD   Encounter Date: 02/10/2019  PT End of Session - 02/10/19 1500    Visit Number  8    Number of Visits  12    Date for PT Re-Evaluation  03/08/19    Authorization Type  aetna MCR, progress visit at 60    PT Start Time  1500    PT Stop Time  1542    PT Time Calculation (min)  42 min    Activity Tolerance  Patient tolerated treatment well    Behavior During Therapy  [facility] for tasks assessed/performed       Past Medical History:  Diagnosis Date  . Anxiety   . Arthritis    lower back, hip  . Back pain    generally "causes severe stiffness"  . Complication of anesthesia    "felt it made her feel very goofy for several monthS with general anesthetic"  . Depression   . GERD (gastroesophageal reflux disease)   . History of kidney stones 1988   x1 episode passed on own  . Hypertension   . 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 Osteopenia   . 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 APPENDECTOMY  2010   open  . BREAST LUMPECTOMY Left 1961   benign tumor  . TOTAL HIP ARTHROPLASTY  04/19/2012   Procedure: TOTAL HIP ARTHROPLASTY ANTERIOR APPROACH;  Surgeon: Mcarthur Rossetti, MD;  [facility];  Service: Orthopedics;  Laterality: Left;  Left Total Hip Arthroplasty  . TOTAL HIP ARTHROPLASTY Right 04/21/2016   Procedure: RIGHT TOTAL HIP ARTHROPLASTY ANTERIOR APPROACH;  Surgeon: Mcarthur Rossetti, MD;  [facility];  Service: Orthopedics;  Laterality: Right;  . TOTAL KNEE ARTHROPLASTY Right 12/20/2018   Procedure: RIGHT TOTAL KNEE ARTHROPLASTY;  Surgeon: Mcarthur Rossetti, MD;  [facility];  Service:  Orthopedics;  Laterality: Right;    There were no vitals filed for this visit.  Subjective Assessment - 02/10/19 1500    Subjective  Yesterday was a really bad day but I am feeling good today. I would like to get back into my canoe.     Patient Stated Goals  get my knee more ROM and get back to hiking    Currently in Pain?  No/denies         [facility] PT Assessment - 02/10/19 0001      AROM   Right Knee Flexion  106   112 after mobilizations                  [facility] Adult PT Treatment/Exercise - 02/10/19 0001      Knee/Hip Exercises: Stretches   Knee: Self-Stretch Limitations  supine with strap    Gastroc Stretch  Both;2 reps;30 seconds    Gastroc Stretch Limitations  slantboard      Knee/Hip Exercises: Aerobic   Stationary Bike  5 min      Knee/Hip Exercises: Standing   Heel Raises  Both;20 reps    Heel Raises Limitations  without UE assist    Forward Lunges  Left    Forward Lunges Limitations  knee to tap 8" stool    Rocker Board Limitations  a/p &  lateral, static & dynamic    Other Standing Knee Exercises  tandem walking in // bars      Manual Therapy   Manual therapy comments  end range flexion gr 4 mobs 3 rounds 3x30s             PT Education - 02/10/19 1544    Education Details  exercise form/rationale    Person(s) Educated  Patient    Methods  Explanation;Demonstration;Verbal cues    Comprehension  Verbalized understanding;Need further instruction;Returned demonstration;Verbal cues required          PT Long Term Goals - 01/30/19 1128      PT LONG TERM GOAL #1   Title  Pt will be I and compliant with HEP. (6 weeks 03/08/19)    Status  On-going      PT LONG TERM GOAL #2   Title  Pt will improve Rt knee ROM to 0-115 deg to improve functional abilities. (6 weeks 03/08/19)    Status  On-going      PT LONG TERM GOAL #3   Title  Pt will improve hip/knee strength to grossly 5/5 MMT tested in sitting. (6 weeks 03/08/19)    Status  Partially Met       PT LONG TERM GOAL #4   Title  Pt will be able to ambulate communtiy distances and walk on uneven terrain with [facility] gait pattern and return to walking trails/hiking. (6 weeks 03/08/19)    Status  On-going      PT LONG TERM GOAL #5   Title  Pt will have less than 2/10 overall pain with usual activity. (6 weeks 03/08/19)    Status  On-going            Plan - 02/10/19 1548    Clinical Impression Statement  Good improvement in range following joint mobilizations. Difficulty with balance control on rocker board- will continue to benefit from challenges due to goals. Denied modalities- just a little stiffness after treatment which was advised as normal and encouraged her to take a small walk later.     PT Treatment/Interventions  Cryotherapy;Electrical Stimulation;Iontophoresis 68m/ml Dexamethasone;Moist Heat;Traction;Ultrasound;Gait training;Stair training;Therapeutic activities;Therapeutic exercise;Neuromuscular re-education;Passive range of motion;Dry needling;Manual techniques;Joint Manipulations;Taping    PT Next Visit Plan  knee ROM, progress functional strength and gait. Closed chain preferred.      PT Home Exercise Plan  [id number] , HSS, heel slides, knee flexion lunge stretch, sit to stands, stand march, added self massage, march walking, pre gait hip flexion with heel stike    Consulted and Agree with Plan of Care  Patient       Patient will benefit from skilled therapeutic intervention in order to improve the following deficits and impairments:  Decreased activity tolerance, Decreased endurance, Decreased range of motion, Abnormal gait, Decreased strength, Hypomobility, Difficulty walking, Increased edema, Impaired flexibility, Increased fascial restricitons, Pain  Visit Diagnosis: Acute pain of right knee  Stiffness of right knee, not elsewhere classified  Difficulty in walking, not elsewhere classified     Problem List Patient Active Problem List   Diagnosis Date Noted  .  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 Chronic pain of left knee 09/06/2017  . Unilateral primary osteoarthritis, left knee 09/06/2017  . Chronic pain of right knee 09/06/2017  . Leg length difference, acquired 03/06/2017  . Low back pain 03/06/2017  . Unilateral primary osteoarthritis, right knee 12/06/2016  . 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 Degenerative arthritis of  hip 04/19/2012  . Anxiety   . Cancer ([place])   . Depression   . Arthritis   . RENAL CALCULUS, RIGHT 12/15/2008  . ABDOMINAL PAIN, UNSPECIFIED SITE 12/15/2008  . NEPHROLITHIASIS, HX OF 12/15/2008  . ANXIETY DEPRESSION 10/29/2007  . CONTACT DERMATITIS DUE TO SOLAR RADIATION 10/29/2007  . 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 Haneef Hallquist PT, DPT 02/10/19 3:50 PM    [facility] [address] [place], [place], [address] Phone: [phone]   Fax:  [phone]  Name: [name] MRN: [id number] Date of Birth: 12/21/1940

## 2019-02-12 ENCOUNTER — Ambulatory Visit: Payer: Medicare HMO | Admitting: Physical Therapy

## 2019-02-12 ENCOUNTER — Encounter: Payer: Self-pay | Admitting: Physical Therapy

## 2019-02-12 ENCOUNTER — Other Ambulatory Visit: Payer: Self-pay

## 2019-02-12 DIAGNOSIS — D0439 Carcinoma in situ of skin of other parts of face: Secondary | ICD-10-CM | POA: Diagnosis not present

## 2019-02-12 DIAGNOSIS — M25661 Stiffness of right knee, not elsewhere classified: Secondary | ICD-10-CM

## 2019-02-12 DIAGNOSIS — L570 Actinic keratosis: Secondary | ICD-10-CM | POA: Diagnosis not present

## 2019-02-12 DIAGNOSIS — D485 Neoplasm of uncertain behavior of skin: Secondary | ICD-10-CM | POA: Diagnosis not present

## 2019-02-12 DIAGNOSIS — R262 Difficulty in walking, not elsewhere classified: Secondary | ICD-10-CM

## 2019-02-12 DIAGNOSIS — L821 Other seborrheic keratosis: Secondary | ICD-10-CM | POA: Diagnosis not present

## 2019-02-12 DIAGNOSIS — M25561 Pain in right knee: Secondary | ICD-10-CM | POA: Diagnosis not present

## 2019-02-12 NOTE — Therapy (Signed)
[place], [place], [address] Phone: [phone]   Fax:  [phone]  Physical Therapy Treatment  Patient Details  Name: [name] MRN: [id number] Date of Birth: 1941-04-18 Referring Provider (PT): Mcarthur Rossetti, MD   Encounter Date: 02/12/2019  PT End of Session - 02/12/19 1511    Visit Number  9    Number of Visits  12    Date for PT Re-Evaluation  03/08/19    Authorization Type  aetna MCR, progress visit at 18    PT Start Time  1500    PT Stop Time  1547    PT Time Calculation (min)  47 min    Activity Tolerance  Patient tolerated treatment well    Behavior During Therapy  [facility] for tasks assessed/performed       Past Medical History:  Diagnosis Date  . Anxiety   . Arthritis    lower back, hip  . Back pain    generally "causes severe stiffness"  . Complication of anesthesia    "felt it made her feel very goofy for several monthS with general anesthetic"  . Depression   . GERD (gastroesophageal reflux disease)   . History of kidney stones 1988   x1 episode passed on own  . Hypertension   . 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 Osteopenia   . 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 APPENDECTOMY  2010   open  . BREAST LUMPECTOMY Left 1961   benign tumor  . TOTAL HIP ARTHROPLASTY  04/19/2012   Procedure: TOTAL HIP ARTHROPLASTY ANTERIOR APPROACH;  Surgeon: Mcarthur Rossetti, MD;  [facility];  Service: Orthopedics;  Laterality: Left;  Left Total Hip Arthroplasty  . TOTAL HIP ARTHROPLASTY Right 04/21/2016   Procedure: RIGHT TOTAL HIP ARTHROPLASTY ANTERIOR APPROACH;  Surgeon: Mcarthur Rossetti, MD;  [facility];  Service: Orthopedics;  Laterality: Right;  . TOTAL KNEE ARTHROPLASTY Right 12/20/2018   Procedure: RIGHT TOTAL KNEE ARTHROPLASTY;  Surgeon: Mcarthur Rossetti, MD;  [facility];  Service:  Orthopedics;  Laterality: Right;    There were no vitals filed for this visit.  Subjective Assessment - 02/12/19 1509    Subjective  I was really stiff and sore after last visit.     Patient Stated Goals  get my knee more ROM and get back to hiking    Currently in Pain?  Yes    Pain Score  4     Pain Location  Knee    Pain Orientation  Right    Pain Descriptors / Indicators  Sore    Aggravating Factors   beding too far                       [facility] Adult PT Treatment/Exercise - 02/12/19 0001      Knee/Hip Exercises: Stretches   Passive Hamstring Stretch Limitations  supine with strap +lateral bias    Hip Flexor Stretch Limitations  thomas stretch      Knee/Hip Exercises: Aerobic   Nustep  4 min L3      Knee/Hip Exercises: Standing   Forward Lunges Limitations  Rt leg on to cybex platform    Other Standing Knee Exercises  high stepping with Right leg      Manual Therapy   Manual Therapy  Taping;Soft tissue mobilization    Soft tissue mobilization  around Rt knee at end range flexion on physioball    Kinesiotex  Edema      Kinesiotix   Edema  Rt knee             PT Education - 02/12/19 1547    Education Details  exercise form/rationale, HEP, importance of habits to reinforce gains, PN next visit    Person(s) Educated  Patient    Methods  Explanation;Verbal cues;Demonstration    Comprehension  Verbalized understanding;Returned demonstration;Verbal cues required;Need further instruction          PT Long Term Goals - 01/30/19 1128      PT LONG TERM GOAL #1   Title  Pt will be I and compliant with HEP. (6 weeks 03/08/19)    Status  On-going      PT LONG TERM GOAL #2   Title  Pt will improve Rt knee ROM to 0-115 deg to improve functional abilities. (6 weeks 03/08/19)    Status  On-going      PT LONG TERM GOAL #3   Title  Pt will improve hip/knee strength to grossly 5/5 MMT tested in sitting. (6 weeks 03/08/19)    Status  Partially Met      PT  LONG TERM GOAL #4   Title  Pt will be able to ambulate communtiy distances and walk on uneven terrain with [facility] gait pattern and return to walking trails/hiking. (6 weeks 03/08/19)    Status  On-going      PT LONG TERM GOAL #5   Title  Pt will have less than 2/10 overall pain with usual activity. (6 weeks 03/08/19)    Status  On-going            Plan - 02/12/19 1511    Clinical Impression Statement  pitting edema noted lateral to patellar tendon. PROM to 104 today. Good gait pattern but tends to limp for the first few steps which we discussed.     PT Treatment/Interventions  Cryotherapy;Electrical Stimulation;Iontophoresis 38m/ml Dexamethasone;Moist Heat;Traction;Ultrasound;Gait training;Stair training;Therapeutic activities;Therapeutic exercise;Neuromuscular re-education;Passive range of motion;Dry needling;Manual techniques;Joint Manipulations;Taping    PT Next Visit Plan  10th visit PN, ROM, balance    PT Home Exercise Plan  [id number] , HSS, heel slides, knee flexion lunge stretch, sit to stands, stand march, added self massage, march walking, pre gait hip flexion with heel stike, thomas stretch    Consulted and Agree with Plan of Care  Patient       Patient will benefit from skilled therapeutic intervention in order to improve the following deficits and impairments:  Decreased activity tolerance, Decreased endurance, Decreased range of motion, Abnormal gait, Decreased strength, Hypomobility, Difficulty walking, Increased edema, Impaired flexibility, Increased fascial restricitons, Pain  Visit Diagnosis: Stiffness of right knee, not elsewhere classified  Difficulty in walking, not elsewhere classified  Acute pain of right knee     Problem List Patient Active Problem List   Diagnosis Date Noted  . 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 Chronic pain of left knee 09/06/2017  . Unilateral primary osteoarthritis, left knee 09/06/2017  . Chronic pain of right knee  09/06/2017  . Leg length difference, acquired 03/06/2017  . Low back pain 03/06/2017  . Unilateral primary osteoarthritis, right knee 12/06/2016  . 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 Degenerative arthritis of hip 04/19/2012  . Anxiety   . Cancer ([place]   . Depression   . Arthritis   . RENAL CALCULUS, RIGHT 12/15/2008  .  ABDOMINAL PAIN, UNSPECIFIED SITE 12/15/2008  . NEPHROLITHIASIS, HX OF 12/15/2008  . ANXIETY DEPRESSION 10/29/2007  . CONTACT DERMATITIS DUE TO SOLAR RADIATION 10/29/2007  . OSTEOARTHROSIS, LOCAL, PRIMARY, [facility] SITE 08/16/2007    Jessica C. Hightower PT, DPT 02/12/19 3:51 PM   [facility] [address] [place], [place], [address] Phone: [phone]   Fax:  [phone]  Name: Solina Heron MRN: [id number] Date of Birth: 11/16/1940

## 2019-02-14 ENCOUNTER — Other Ambulatory Visit: Payer: Self-pay

## 2019-02-14 ENCOUNTER — Ambulatory Visit: Payer: Medicare HMO | Admitting: Physical Therapy

## 2019-02-14 ENCOUNTER — Encounter: Payer: Self-pay | Admitting: Physical Therapy

## 2019-02-14 DIAGNOSIS — M25661 Stiffness of right knee, not elsewhere classified: Secondary | ICD-10-CM | POA: Diagnosis not present

## 2019-02-14 DIAGNOSIS — R262 Difficulty in walking, not elsewhere classified: Secondary | ICD-10-CM

## 2019-02-14 DIAGNOSIS — M25561 Pain in right knee: Secondary | ICD-10-CM

## 2019-02-14 NOTE — Therapy (Signed)
[place], [place], [address] Phone: [phone]   Fax:  [phone]  Physical Therapy Treatment/ERO  Patient Details  Name: [name] MRN: [id number] Date of Birth: [birth year] Referring Provider (PT): Mcarthur Rossetti, MD   Encounter Date: 02/14/2019  PT End of Session - 02/14/19 0817    Visit Number  10    Number of Visits  28    Date for PT Re-Evaluation  03/28/19    Authorization Type  aetna MCR, progress visit at 29    PT Start Time  0813    PT Stop Time  0903    PT Time Calculation (min)  50 min    Activity Tolerance  Patient tolerated treatment well    Behavior During Therapy  [facility] for tasks assessed/performed       Past Medical History:  Diagnosis Date  . Anxiety   . Arthritis    lower back, hip  . Back pain    generally "causes severe stiffness"  . Complication of anesthesia    "felt it made her feel very goofy for several monthS with general anesthetic"  . Depression   . GERD (gastroesophageal reflux disease)   . History of kidney stones 1988   x1 episode passed on own  . Hypertension   . 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 Osteopenia   . 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 APPENDECTOMY  2010   open  . BREAST LUMPECTOMY Left 1961   benign tumor  . TOTAL HIP ARTHROPLASTY  04/19/2012   Procedure: TOTAL HIP ARTHROPLASTY ANTERIOR APPROACH;  Surgeon: Mcarthur Rossetti, MD;  Location: [facility];  Service: Orthopedics;  Laterality: Left;  Left Total Hip Arthroplasty  . TOTAL HIP ARTHROPLASTY Right 04/21/2016   Procedure: RIGHT TOTAL HIP ARTHROPLASTY ANTERIOR APPROACH;  Surgeon: Mcarthur Rossetti, MD;  Location: [facility];  Service: Orthopedics;  Laterality: Right;  . TOTAL KNEE ARTHROPLASTY Right 12/20/2018   Procedure: RIGHT TOTAL KNEE ARTHROPLASTY;  Surgeon: Mcarthur Rossetti, MD;  Location: [facility];   Service: Orthopedics;  Laterality: Right;    There were no vitals filed for this visit.      [facility] PT Assessment - 02/14/19 0001      Assessment   Medical Diagnosis  Rt TKA    Referring Provider (PT)  Mcarthur Rossetti, MD    Onset Date/Surgical Date  12/20/18    Next MD Visit  02/27/19      Precautions   Precautions  None      Restrictions   Weight Bearing Restrictions  No      Balance Screen   Has the patient fallen in the past 6 months  No      [place] residence    Additional Comments  a few steps in concrete walkway to door      Prior Function   Level of Independence  Independent      Cognition   Overall Cognitive Status  Within Functional Limits for tasks assessed      Sensation   Additional Comments  WFL      AROM   Right Knee Extension  -4    Right Knee Flexion  107      Strength   Strength Assessment Site  Hip;Knee    Right/Left Hip  Right;Left    Right Hip Flexion  5/5  Right Hip Extension  4/5    Right Hip ABduction  4/5    Left Hip Flexion  4/5    Left Hip Extension  4+/5    Left Hip ABduction  4/5    Right/Left Knee  Right;Left    Right Knee Flexion  5/5    Right Knee Extension  5/5    Left Knee Flexion  5/5    Left Knee Extension  5/5      Ambulation/Gait   Gait Comments  Lt hip elevated due to Lt trendelenburg, lacking full ext at right heel strike                   [facility] Adult PT Treatment/Exercise - 02/14/19 0001      Knee/Hip Exercises: Stretches   Knee: Self-Stretch Limitations  supine with strap      Knee/Hip Exercises: Aerobic   Nustep  7 min L3      Knee/Hip Exercises: Sidelying   Hip ABduction  20 reps;10 reps;Both    Clams  both x30             PT Education - 02/14/19 0913    Education Details  goals discussion, gait pattern, POC, HEP    Person(s) Educated  Patient    Methods  Explanation;Demonstration;Verbal cues;Handout    Comprehension  Verbalized  understanding;Need further instruction;Returned demonstration;Verbal cues required          PT Long Term Goals - 02/14/19 0839      PT LONG TERM GOAL #1   Title  Pt will be I and compliant with HEP.    Baseline  compliant with current, will benefit from progression    Status  On-going    Target Date  03/28/19      PT LONG TERM GOAL #2   Title  Pt will improve Rt knee ROM to 0-115 deg to improve functional abilities.    Baseline  see flowsheet    Status  On-going    Target Date  03/28/19      PT LONG TERM GOAL #3   Title  Pt will improve hip/knee strength to grossly 5/5 MMT tested in sitting.     Baseline  seeflowsheet    Status  On-going    Target Date  03/28/19      PT LONG TERM GOAL #4   Title  Pt will be able to ambulate communtiy distances and walk on uneven terrain with [facility] gait pattern and return to walking trails/hiking.     Baseline  able to do uphill/downhill on hard surface, has not tried trail    Status  On-going    Target Date  03/28/19      PT LONG TERM GOAL #5   Title  Pt will have less than 2/10 overall pain with usual activity.    Baseline  3/10 avg    Status  On-going    Target Date  03/28/19            Plan - 02/14/19 0907    Clinical Impression Statement  Pt continues to make progress toward goals. Notable weakness in Lt hip decreases space for Rt swing through resulting in lacking knee extension at heel strike and "catch" feeling in posterior knee. Pt will benefit from manual mobilization to increase flexion ROM. We will continue in clinic at 3/week at this time but likely will decrease frequency as ROM shows a more significant improvement.     Comorbidities  WLN:LGXQ Rt THA now  with Rt leg longer and wears lift in Lt shoe 04/21/16, anx,backpain,dep,HTN,osteopenia    PT Frequency  3x / week   will likely decrease to 2/week after 3 weeks   PT Duration  6 weeks    PT Treatment/Interventions  Cryotherapy;Electrical Stimulation;Iontophoresis 4mg /ml  Dexamethasone;Moist Heat;Traction;Ultrasound;Gait training;Stair training;Therapeutic activities;Therapeutic exercise;Neuromuscular re-education;Passive range of motion;Dry needling;Manual techniques;Joint Manipulations;Taping    PT Next Visit Plan  joint mobs for flexion, extension stretching, bilateral hip strengthening- esp Lt    PT Home Exercise Plan  [id number] , HSS, heel slides, knee flexion lunge stretch, sit to stands, stand march, added self massage, march walking, pre gait hip flexion with heel stike, thomas stretch; SL hip abd & clams    Consulted and Agree with Plan of Care  Patient       Patient will benefit from skilled therapeutic intervention in order to improve the following deficits and impairments:  Decreased activity tolerance, Decreased endurance, Decreased range of motion, Abnormal gait, Decreased strength, Hypomobility, Difficulty walking, Increased edema, Impaired flexibility, Increased fascial restricitons, Pain  Visit Diagnosis: Stiffness of right knee, not elsewhere classified - Plan: PT plan of care cert/re-cert  Difficulty in walking, not elsewhere classified - Plan: PT plan of care cert/re-cert  Acute pain of right knee - Plan: PT plan of care cert/re-cert     Problem List Patient Active Problem List   Diagnosis Date Noted  . 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 Chronic pain of left knee 09/06/2017  . Unilateral primary osteoarthritis, left knee 09/06/2017  . Chronic pain of right knee 09/06/2017  . Leg length difference, acquired 03/06/2017  . Low back pain 03/06/2017  . Unilateral primary osteoarthritis, right knee 12/06/2016  . 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 Degenerative arthritis of hip 04/19/2012  . Anxiety   . Cancer ([place])   . Depression   . Arthritis   . RENAL CALCULUS, RIGHT 12/15/2008  . ABDOMINAL PAIN, UNSPECIFIED SITE 12/15/2008  . NEPHROLITHIASIS, HX OF 12/15/2008  . ANXIETY DEPRESSION  10/29/2007  . CONTACT DERMATITIS DUE TO SOLAR RADIATION 10/29/2007  . 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 Lenon Kuennen PT, DPT 02/14/19 9:16 AM   [facility] [address] [place], [place], [address] Phone: [phone]   Fax:  [phone]  Name: Hero Mccathern MRN: [id number] Date of Birth: 03-29-1941

## 2019-02-17 ENCOUNTER — Ambulatory Visit: Payer: Medicare HMO | Admitting: Physical Therapy

## 2019-02-17 ENCOUNTER — Other Ambulatory Visit: Payer: Self-pay

## 2019-02-17 DIAGNOSIS — M25661 Stiffness of right knee, not elsewhere classified: Secondary | ICD-10-CM

## 2019-02-17 DIAGNOSIS — R262 Difficulty in walking, not elsewhere classified: Secondary | ICD-10-CM

## 2019-02-17 DIAGNOSIS — M25561 Pain in right knee: Secondary | ICD-10-CM

## 2019-02-17 NOTE — Therapy (Signed)
[place], [place], [address] Phone: [phone]   Fax:  [phone]  Physical Therapy Treatment  Patient Details  Name: [name] MRN: [id number] Date of Birth: [birth year] Referring Provider (PT): Mcarthur Rossetti, MD   Encounter Date: 02/17/2019  PT End of Session - 02/17/19 1449    Visit Number  11    Number of Visits  28    Date for PT Re-Evaluation  03/28/19    Authorization Type  aetna MCR, progress visit at 31    PT Start Time  1155    PT Stop Time  1240    PT Time Calculation (min)  45 min    Activity Tolerance  Patient tolerated treatment well    Behavior During Therapy  [facility] for tasks assessed/performed       Past Medical History:  Diagnosis Date  . Anxiety   . Arthritis    lower back, hip  . Back pain    generally "causes severe stiffness"  . Complication of anesthesia    "felt it made her feel very goofy for several monthS with general anesthetic"  . Depression   . GERD (gastroesophageal reflux disease)   . History of kidney stones 1988   x1 episode passed on own  . Hypertension   . 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 Osteopenia   . 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 APPENDECTOMY  2010   open  . BREAST LUMPECTOMY Left 1961   benign tumor  . TOTAL HIP ARTHROPLASTY  04/19/2012   Procedure: TOTAL HIP ARTHROPLASTY ANTERIOR APPROACH;  Surgeon: Mcarthur Rossetti, MD;  [facility];  Service: Orthopedics;  Laterality: Left;  Left Total Hip Arthroplasty  . TOTAL HIP ARTHROPLASTY Right 04/21/2016   Procedure: RIGHT TOTAL HIP ARTHROPLASTY ANTERIOR APPROACH;  Surgeon: Mcarthur Rossetti, MD;  [facility];  Service: Orthopedics;  Laterality: Right;  . TOTAL KNEE ARTHROPLASTY Right 12/20/2018   Procedure: RIGHT TOTAL KNEE ARTHROPLASTY;  Surgeon: Mcarthur Rossetti, MD;  [facility];  Service:  Orthopedics;  Laterality: Right;    There were no vitals filed for this visit.  Subjective Assessment - 02/17/19 1445    Subjective  I have some swelling and tightness but overall my knee is okay    Pertinent History  ULA:GTXM Rt THA now with Rt leg longer and wears lift in Lt shoe 04/21/16, anx,backpain,dep,HTN,osteopenia    Patient Stated Goals  get my knee more ROM and get back to hiking    Currently in Pain?  Yes    Pain Score  2     Pain Location  Knee    Pain Orientation  Right    Pain Descriptors / Indicators  Aching;Tightness                       [facility] Adult PT Treatment/Exercise - 02/17/19 0001      Knee/Hip Exercises: Stretches   Passive Hamstring Stretch  Left;3 reps;30 seconds    Passive Hamstring Stretch Limitations  standing with leg ext on step    Knee: Self-Stretch Limitations  supine with strap 5 sec X 15    Other Knee/Hip Stretches  prone hang 3 min for knee ext    Other Knee/Hip Stretches  standing lunge stretch 10 sec X 10 for flexion      Knee/Hip Exercises: Aerobic   Nustep  7 min L3      Knee/Hip Exercises: Standing   Forward Step Up  Right;15 reps;Hand Hold: 0;Hand Hold: 1;Step Height: 8"      Knee/Hip Exercises: Supine   Straight Leg Raises  Right;1 set;15 reps      Knee/Hip Exercises: Sidelying   Hip ABduction  Right;15 reps      Manual Therapy   Manual therapy comments  STM to quads/H.S, knee PROM and jt mobs                  PT Long Term Goals - 02/14/19 3532      PT LONG TERM GOAL #1   Title  Pt will be I and compliant with HEP.    Baseline  compliant with current, will benefit from progression    Status  On-going    Target Date  03/28/19      PT LONG TERM GOAL #2   Title  Pt will improve Rt knee ROM to 0-115 deg to improve functional abilities.    Baseline  see flowsheet    Status  On-going    Target Date  03/28/19      PT LONG TERM GOAL #3   Title  Pt will improve hip/knee strength to grossly 5/5 MMT  tested in sitting.     Baseline  seeflowsheet    Status  On-going    Target Date  03/28/19      PT LONG TERM GOAL #4   Title  Pt will be able to ambulate communtiy distances and walk on uneven terrain with [facility] gait pattern and return to walking trails/hiking.     Baseline  able to do uphill/downhill on hard surface, has not tried trail    Status  On-going    Target Date  03/28/19      PT LONG TERM GOAL #5   Title  Pt will have less than 2/10 overall pain with usual activity.    Baseline  3/10 avg    Status  On-going    Target Date  03/28/19            Plan - 02/17/19 1450    Clinical Impression Statement  Pt had some improvements noted in knee ROM. She was shown prone hang at home and SLR at home for knee ext ROM and hip strengthening. PT will continue to progress as able.    Comorbidities  DJM:EQAS Rt THA now with Rt leg longer and wears lift in Lt shoe 04/21/16, anx,backpain,dep,HTN,osteopenia    PT Frequency  3x / week   will likely decrease to 2/week after 3 weeks   PT Duration  6 weeks    PT Treatment/Interventions  Cryotherapy;Electrical Stimulation;Iontophoresis 4mg /ml Dexamethasone;Moist Heat;Traction;Ultrasound;Gait training;Stair training;Therapeutic activities;Therapeutic exercise;Neuromuscular re-education;Passive range of motion;Dry needling;Manual techniques;Joint Manipulations;Taping    PT Next Visit Plan  joint mobs for flexion, extension stretching, bilateral hip strengthening- esp Lt    PT Home Exercise Plan  [id number] , HSS, heel slides, knee flexion lunge stretch, sit to stands, stand march, added self massage, march walking, pre gait hip flexion with heel stike, thomas stretch; SL hip abd & clams    Consulted and Agree with Plan of Care  Patient       Patient will benefit from skilled therapeutic intervention in order to improve the following deficits and impairments:  Decreased activity tolerance, Decreased endurance, Decreased range of motion, Abnormal gait,  Decreased strength, Hypomobility, Difficulty walking, Increased edema, Impaired flexibility, Increased fascial restricitons, Pain  Visit  Diagnosis: Stiffness of right knee, not elsewhere classified  Difficulty in walking, not elsewhere classified  Acute pain of right knee     Problem List Patient Active Problem List   Diagnosis Date Noted  . 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 Chronic pain of left knee 09/06/2017  . Unilateral primary osteoarthritis, left knee 09/06/2017  . Chronic pain of right knee 09/06/2017  . Leg length difference, acquired 03/06/2017  . Low back pain 03/06/2017  . Unilateral primary osteoarthritis, right knee 12/06/2016  . 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 Degenerative arthritis of hip 04/19/2012  . Anxiety   . Cancer ([place])   . Depression   . Arthritis   . RENAL CALCULUS, RIGHT 12/15/2008  . ABDOMINAL PAIN, UNSPECIFIED SITE 12/15/2008  . NEPHROLITHIASIS, HX OF 12/15/2008  . ANXIETY DEPRESSION 10/29/2007  . CONTACT DERMATITIS DUE TO SOLAR RADIATION 10/29/2007  . OSTEOARTHROSIS, LOCAL, PRIMARY, [facility] SITE 08/16/2007    Silvestre Mesi 02/17/2019, 2:52 PM  [facility] [address] [place], [place], [address] Phone: [phone]   Fax:  [phone]  Name: Zayah Keilman MRN: [id number] Date of Birth: 09-Aug-1941

## 2019-02-19 ENCOUNTER — Other Ambulatory Visit: Payer: Self-pay

## 2019-02-19 ENCOUNTER — Ambulatory Visit: Payer: Medicare HMO | Admitting: Physical Therapy

## 2019-02-19 DIAGNOSIS — M25661 Stiffness of right knee, not elsewhere classified: Secondary | ICD-10-CM | POA: Diagnosis not present

## 2019-02-19 DIAGNOSIS — M25561 Pain in right knee: Secondary | ICD-10-CM | POA: Diagnosis not present

## 2019-02-19 DIAGNOSIS — R262 Difficulty in walking, not elsewhere classified: Secondary | ICD-10-CM

## 2019-02-19 NOTE — Patient Instructions (Signed)
Access Code: [email or web address]  URL: [email or web address]  Date: 02/19/2019  Prepared by: Elsie Ra   Exercises  Seated Hamstring Stretch - 3 sets - 30 hold - 2-3x daily - 6x weekly  Supine Heel Slide with Strap - 10 reps - 2 sets - 5 hold - 2-3x daily - 6x weekly  Standing Knee Flexion Stretch on Step - 10 reps - 1 sets - 10 hold - 2-3x daily - 6x weekly  Sit to Stand - 10 reps - 1-2 sets - 1x daily - 6x weekly  Prone Knee Extension Hang - 1 reps - 1 sets - 5 min hold - 1-2x daily - 6x weekly  Supine Active Straight Leg Raise - 10 reps - 1-3 sets - 1x daily - 6x weekly  Sidelying Hip Abduction - 10 reps - 1-3 sets - 1x daily - 6x weekly  Gastroc Stretch on Wall - 3 sets - 30 hold - 1x daily - 3x weekly  Single Leg Stance - 3-5 reps - 1 sets - as long as you can hold - 1x daily - 3x weekly

## 2019-02-19 NOTE — Therapy (Signed)
[place], [place], [address] Phone: [phone]   Fax:  [phone]  Physical Therapy Treatment  Patient Details  Name: [name] MRN: [id number] Date of Birth: [birth year] Referring Provider (PT): Mcarthur Rossetti, MD   Encounter Date: 02/19/2019  PT End of Session - 02/19/19 1125    Visit Number  12    Number of Visits  28    Date for PT Re-Evaluation  03/28/19    Authorization Type  aetna MCR, progress visit at 18    PT Start Time  1100    PT Stop Time  1145    PT Time Calculation (min)  45 min    Activity Tolerance  Patient tolerated treatment well    Behavior During Therapy  [facility] for tasks assessed/performed       Past Medical History:  Diagnosis Date  . Anxiety   . Arthritis    lower back, hip  . Back pain    generally "causes severe stiffness"  . Complication of anesthesia    "felt it made her feel very goofy for several monthS with general anesthetic"  . Depression   . GERD (gastroesophageal reflux disease)   . History of kidney stones 1988   x1 episode passed on own  . Hypertension   . 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 Osteopenia   . 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 APPENDECTOMY  2010   open  . BREAST LUMPECTOMY Left 1961   benign tumor  . TOTAL HIP ARTHROPLASTY  04/19/2012   Procedure: TOTAL HIP ARTHROPLASTY ANTERIOR APPROACH;  Surgeon: Mcarthur Rossetti, MD;  Location: [facility];  Service: Orthopedics;  Laterality: Left;  Left Total Hip Arthroplasty  . TOTAL HIP ARTHROPLASTY Right 04/21/2016   Procedure: RIGHT TOTAL HIP ARTHROPLASTY ANTERIOR APPROACH;  Surgeon: Mcarthur Rossetti, MD;  Location: [facility];  Service: Orthopedics;  Laterality: Right;  . TOTAL KNEE ARTHROPLASTY Right 12/20/2018   Procedure: RIGHT TOTAL KNEE ARTHROPLASTY;  Surgeon: Mcarthur Rossetti, MD;  Location: [facility];  Service:  Orthopedics;  Laterality: Right;    There were no vitals filed for this visit.  Subjective Assessment - 02/19/19 1124    Subjective  Pt relays stiffness and the knee does not feel that great today    Currently in Pain?  Yes    Pain Score  2     Pain Location  Knee    Pain Orientation  Right    Pain Descriptors / Indicators  Aching;Tightness    Pain Type  Surgical pain         [facility] PT Assessment - 02/19/19 0001      Assessment   Medical Diagnosis  Rt TKA    Referring Provider (PT)  Mcarthur Rossetti, MD    Onset Date/Surgical Date  12/20/18    Next MD Visit  02/27/19      PROM   Right Knee Extension  -2    Right Knee Flexion  111                   [facility] Adult PT Treatment/Exercise - 02/19/19 0001      Knee/Hip Exercises: Stretches   Passive Hamstring Stretch  Left;3 reps;30 seconds    Passive Hamstring Stretch Limitations  standing with leg ext on step    Knee: Self-Stretch Limitations  supine with strap 5 sec X 15  Other Knee/Hip Stretches  standing lunge stretch 10 sec X 10 for flexion      Knee/Hip Exercises: Aerobic   Nustep  6 min L5 LE only      Knee/Hip Exercises: Machines for Strengthening   Cybex Leg Press  Omega leg press Rt leg only 20 lbs X 20      Knee/Hip Exercises: Seated   Sit to Sand  2 sets;10 reps;without UE support      Knee/Hip Exercises: Supine   Straight Leg Raises  Right;20 reps      Knee/Hip Exercises: Sidelying   Hip ABduction  Right;15 reps    Clams  Right 15 reps      Manual Therapy   Manual therapy comments  STM to quads/H.S, knee PROM and jt mobs                  PT Long Term Goals - 02/14/19 0839      PT LONG TERM GOAL #1   Title  Pt will be I and compliant with HEP.    Baseline  compliant with current, will benefit from progression    Status  On-going    Target Date  03/28/19      PT LONG TERM GOAL #2   Title  Pt will improve Rt knee ROM to 0-115 deg to improve functional abilities.     Baseline  see flowsheet    Status  On-going    Target Date  03/28/19      PT LONG TERM GOAL #3   Title  Pt will improve hip/knee strength to grossly 5/5 MMT tested in sitting.     Baseline  seeflowsheet    Status  On-going    Target Date  03/28/19      PT LONG TERM GOAL #4   Title  Pt will be able to ambulate communtiy distances and walk on uneven terrain with [facility] gait pattern and return to walking trails/hiking.     Baseline  able to do uphill/downhill on hard surface, has not tried trail    Status  On-going    Target Date  03/28/19      PT LONG TERM GOAL #5   Title  Pt will have less than 2/10 overall pain with usual activity.    Baseline  3/10 avg    Status  On-going    Target Date  03/28/19            Plan - 02/19/19 1200    Clinical Impression Statement  Session continued to focus on ROM and stength in her knee and she is making slow but steady progress in this area, see updated ROM measurements. She had less swelling in her knee and reports it feels much better after session. Her HEP was updated today due to progress and to priority which ones she needs to do the most which are her stretches.     Rehab Potential  Good    PT Frequency  3x / week    PT Duration  6 weeks    PT Treatment/Interventions  Cryotherapy;Electrical Stimulation;Iontophoresis 4mg /ml Dexamethasone;Moist Heat;Traction;Ultrasound;Gait training;Stair training;Therapeutic activities;Therapeutic exercise;Neuromuscular re-education;Passive range of motion;Dry needling;Manual techniques;Joint Manipulations;Taping    PT Next Visit Plan  joint mobs for flexion, extension stretching, bilateral hip strengthening- esp Lt    PT Home Exercise Plan  [id number] , HSS, heel slides, knee flexion lunge stretch, sit to stands, stand march, SLS,  SL hip abd & clams    Consulted and Agree with Plan  of Care  Patient       Patient will benefit from skilled therapeutic intervention in order to improve the following deficits  and impairments:     Visit Diagnosis: Stiffness of right knee, not elsewhere classified  Difficulty in walking, not elsewhere classified  Acute pain of right knee     Problem List Patient Active Problem List   Diagnosis Date Noted  . 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 Chronic pain of left knee 09/06/2017  . Unilateral primary osteoarthritis, left knee 09/06/2017  . Chronic pain of right knee 09/06/2017  . Leg length difference, acquired 03/06/2017  . Low back pain 03/06/2017  . Unilateral primary osteoarthritis, right knee 12/06/2016  . 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 Degenerative arthritis of hip 04/19/2012  . Anxiety   . Cancer ([place])   . Depression   . Arthritis   . RENAL CALCULUS, RIGHT 12/15/2008  . ABDOMINAL PAIN, UNSPECIFIED SITE 12/15/2008  . NEPHROLITHIASIS, HX OF 12/15/2008  . ANXIETY DEPRESSION 10/29/2007  . CONTACT DERMATITIS DUE TO SOLAR RADIATION 10/29/2007  . 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 [place], [place], [address] Phone: [phone]   Fax:  [phone]  Name: [name] MRN: [id number] Date of Birth: [birth year]

## 2019-02-21 ENCOUNTER — Other Ambulatory Visit: Payer: Self-pay

## 2019-02-21 ENCOUNTER — Ambulatory Visit: Payer: Medicare HMO | Admitting: Physical Therapy

## 2019-02-21 DIAGNOSIS — M25661 Stiffness of right knee, not elsewhere classified: Secondary | ICD-10-CM | POA: Diagnosis not present

## 2019-02-21 DIAGNOSIS — M25561 Pain in right knee: Secondary | ICD-10-CM

## 2019-02-21 DIAGNOSIS — R262 Difficulty in walking, not elsewhere classified: Secondary | ICD-10-CM

## 2019-02-21 NOTE — Therapy (Signed)
[place], [place], [address] Phone: [phone]   Fax:  [phone]  Physical Therapy Treatment  Patient Details  Name: [name] MRN: [id number] Date of Birth: May 19, 1941 Referring Provider (PT): Mcarthur Rossetti, MD   Encounter Date: 02/21/2019  PT End of Session - 02/21/19 1150    Visit Number  13    Number of Visits  28    Date for PT Re-Evaluation  03/28/19    Authorization Type  aetna MCR, progress visit at 34    PT Start Time  0930    PT Stop Time  1030    PT Time Calculation (min)  60 min    Activity Tolerance  Patient tolerated treatment well    Behavior During Therapy  [facility] for tasks assessed/performed       Past Medical History:  Diagnosis Date  . Anxiety   . Arthritis    lower back, hip  . Back pain    generally "causes severe stiffness"  . Complication of anesthesia    "felt it made her feel very goofy for several monthS with general anesthetic"  . Depression   . GERD (gastroesophageal reflux disease)   . History of kidney stones 1988   x1 episode passed on own  . Hypertension   . 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 Osteopenia   . 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 APPENDECTOMY  2010   open  . BREAST LUMPECTOMY Left 1961   benign tumor  . TOTAL HIP ARTHROPLASTY  04/19/2012   Procedure: TOTAL HIP ARTHROPLASTY ANTERIOR APPROACH;  Surgeon: Mcarthur Rossetti, MD;  [facility]: [facility];  Service: Orthopedics;  Laterality: Left;  Left Total Hip Arthroplasty  . TOTAL HIP ARTHROPLASTY Right 04/21/2016   Procedure: RIGHT TOTAL HIP ARTHROPLASTY ANTERIOR APPROACH;  Surgeon: Mcarthur Rossetti, MD;  Location: [facility];  Service: Orthopedics;  Laterality: Right;  . TOTAL KNEE ARTHROPLASTY Right 12/20/2018   Procedure: RIGHT TOTAL KNEE ARTHROPLASTY;  Surgeon: Mcarthur Rossetti, MD;  Location: [facility];  Service:  Orthopedics;  Laterality: Right;    There were no vitals filed for this visit.      [facility] PT Assessment - 02/21/19 0001      Assessment   Medical Diagnosis  Rt TKA    Referring Provider (PT)  Mcarthur Rossetti, MD    Onset Date/Surgical Date  12/20/18    Next MD Visit  02/27/19      AROM   Right Knee Flexion  104   AAROM with strap     PROM   Right Knee Extension  -2    Right Knee Flexion  114                   [facility] Adult PT Treatment/Exercise - 02/21/19 1147      Knee/Hip Exercises: Stretches   Passive Hamstring Stretch  Left;3 reps;30 seconds    Passive Hamstring Stretch Limitations  standing with leg ext on step    Knee: Self-Stretch Limitations  supine with strap 5 sec X 15    Other Knee/Hip Stretches  standing lunge stretch 10 sec X 10 for flexion      Knee/Hip Exercises: Aerobic   Recumbent Bike  7 min L3      Knee/Hip Exercises: Machines for Strengthening   Cybex Leg Press  Omega leg press Rt leg only 20 lbs X  20      Knee/Hip Exercises: Standing   Forward Step Up  Right;20 reps;Hand Hold: 2;Step Height: 8"   at hip machine step   Functional Squat  20 reps    Functional Squat Limitations  TRX      Knee/Hip Exercises: Seated   Sit to Sand  2 sets;10 reps;without UE support      Knee/Hip Exercises: Supine   Straight Leg Raises  Right;20 reps      Knee/Hip Exercises: Sidelying   Hip ABduction  Right;15 reps      Modalities   Modalities  Vasopneumatic      Vasopneumatic   Number Minutes Vasopneumatic   15 minutes    Vasopnuematic Location   Knee    Vasopneumatic Pressure  Medium    Vasopneumatic Temperature   --   34     Manual Therapy   Manual therapy comments  STM to quads/H.S, knee PROM and jt mobs                  PT Long Term Goals - 02/14/19 0839      PT LONG TERM GOAL #1   Title  Pt will be I and compliant with HEP.    Baseline  compliant with current, will benefit from progression    Status  On-going     Target Date  03/28/19      PT LONG TERM GOAL #2   Title  Pt will improve Rt knee ROM to 0-115 deg to improve functional abilities.    Baseline  see flowsheet    Status  On-going    Target Date  03/28/19      PT LONG TERM GOAL #3   Title  Pt will improve hip/knee strength to grossly 5/5 MMT tested in sitting.     Baseline  seeflowsheet    Status  On-going    Target Date  03/28/19      PT LONG TERM GOAL #4   Title  Pt will be able to ambulate communtiy distances and walk on uneven terrain with [facility] gait pattern and return to walking trails/hiking.     Baseline  able to do uphill/downhill on hard surface, has not tried trail    Status  On-going    Target Date  03/28/19      PT LONG TERM GOAL #5   Title  Pt will have less than 2/10 overall pain with usual activity.    Baseline  3/10 avg    Status  On-going    Target Date  03/28/19            Plan - 02/21/19 1150    Clinical Impression Statement  Pt again showed improvments in knee ROm, see updated measurements. She also continues to make good progress with strengthening program but does sitll have some mild to moderate defecits in these areas and will conitnue to benefit from PT.  She has had no complaints of pain just continued stiffness in her knee. She did have some mild edema today which was treated with vasopnuematic.   Rehab Potential  Good    PT Frequency  3x / week    PT Duration  6 weeks    PT Next Visit Plan  joint mobs for flexion, extension stretching, bilateral hip strengthening- esp Lt    PT Home Exercise Plan  [id number] , HSS, heel slides, knee flexion lunge stretch, sit to stands, stand march, SLS,  SL hip abd & clams  Consulted and Agree with Plan of Care  Patient       Patient will benefit from skilled therapeutic intervention in order to improve the following deficits and impairments:  Decreased activity tolerance, Decreased endurance, Decreased range of motion, Abnormal gait, Decreased strength,  Hypomobility, Difficulty walking, Increased edema, Impaired flexibility, Increased fascial restricitons, Pain  Visit Diagnosis: Stiffness of right knee, not elsewhere classified  Difficulty in walking, not elsewhere classified  Acute pain of right knee     Problem List Patient Active Problem List   Diagnosis Date Noted  . 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 Chronic pain of left knee 09/06/2017  . Unilateral primary osteoarthritis, left knee 09/06/2017  . Chronic pain of right knee 09/06/2017  . Leg length difference, acquired 03/06/2017  . Low back pain 03/06/2017  . Unilateral primary osteoarthritis, right knee 12/06/2016  . 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 Degenerative arthritis of hip 04/19/2012  . Anxiety   . Cancer ([place])   . Depression   . Arthritis   . RENAL CALCULUS, RIGHT 12/15/2008  . ABDOMINAL PAIN, UNSPECIFIED SITE 12/15/2008  . NEPHROLITHIASIS, HX OF 12/15/2008  . ANXIETY DEPRESSION 10/29/2007  . CONTACT DERMATITIS DUE TO SOLAR RADIATION 10/29/2007  . OSTEOARTHROSIS, LOCAL, PRIMARY, [facility] SITE 08/16/2007    [name] 02/21/2019, 12:27 PM  [facility] [address] [place], [place], [address] Phone: [phone]   Fax:  [phone]  Name: [name] MRN: [id number] Date of Birth: 07/08/1941

## 2019-02-23 IMAGING — MG DIGITAL SCREENING BILATERAL MAMMOGRAM WITH CAD
4 series · 4 of 4 positions shown · non-contrast
Comparison: Previous exam(s).

CLINICAL DATA: Screening.

EXAM:
DIGITAL SCREENING BILATERAL MAMMOGRAM WITH CAD

[R CC]
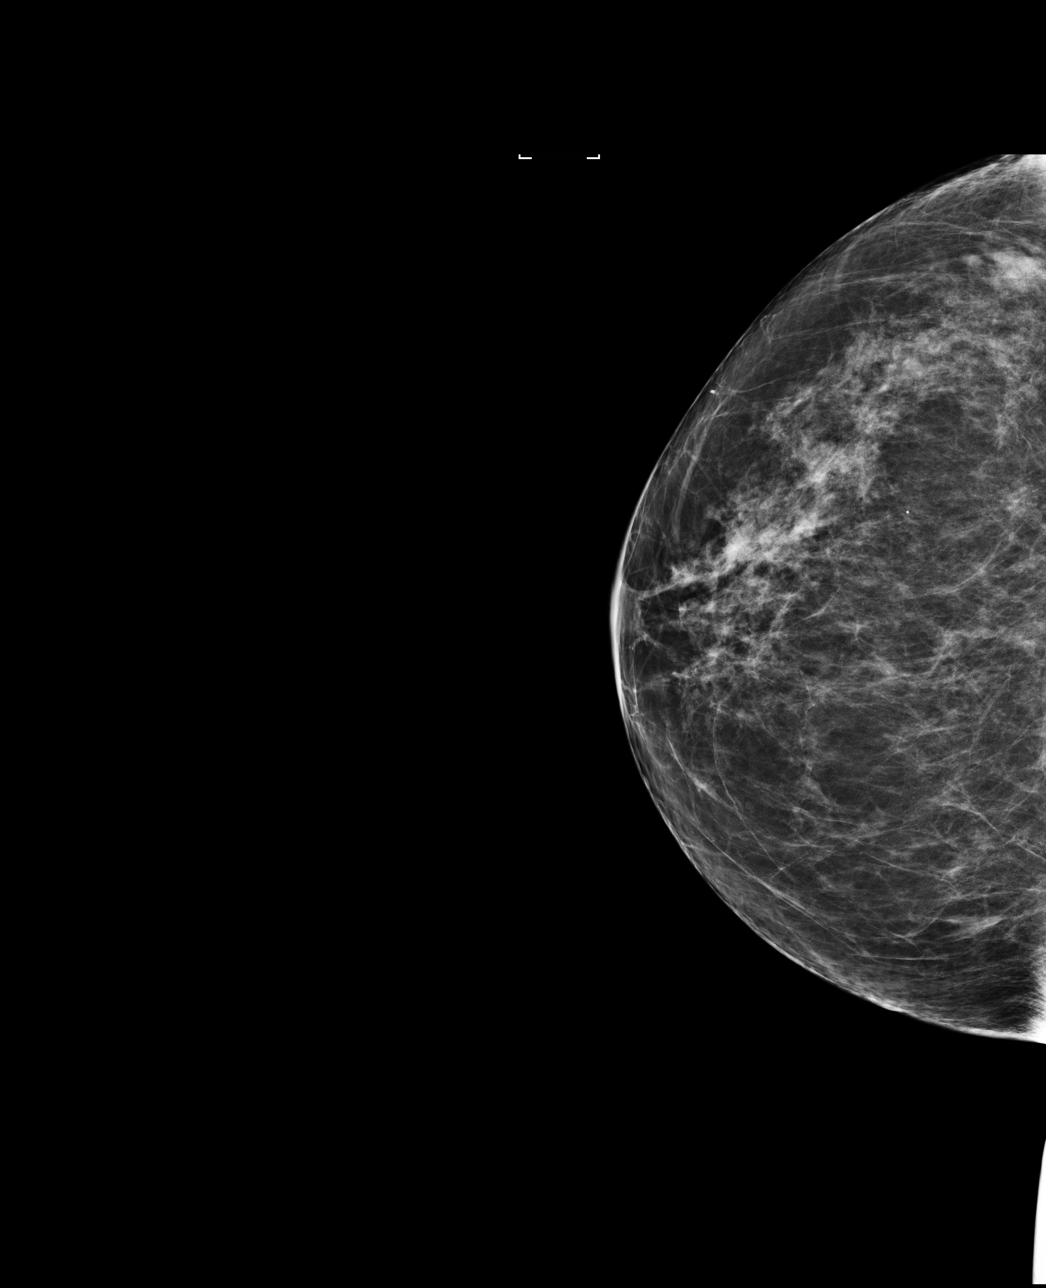

[L CC]
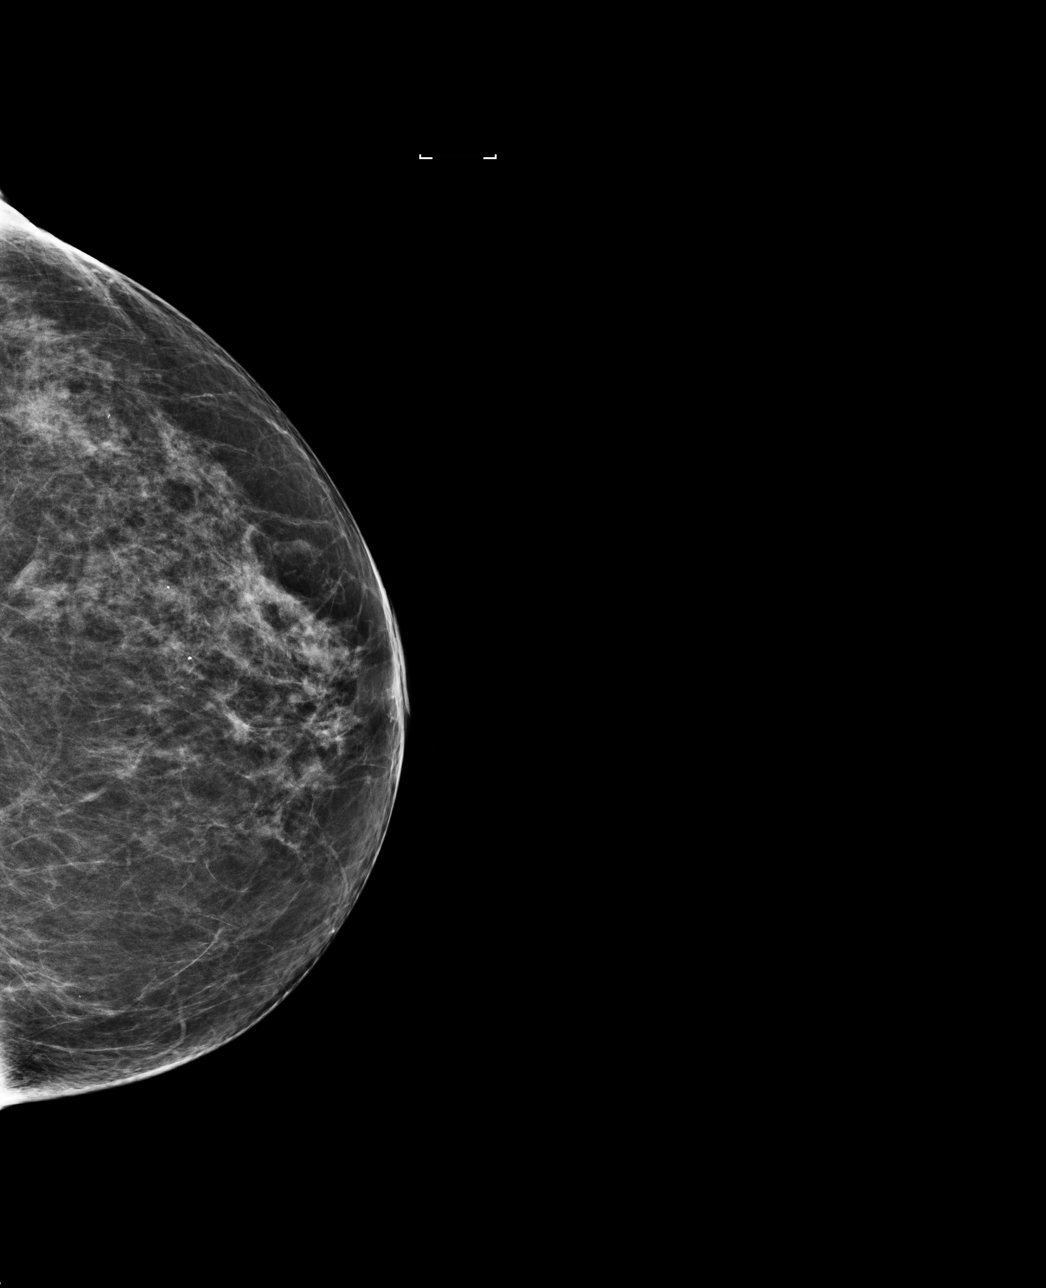

[L MLO]
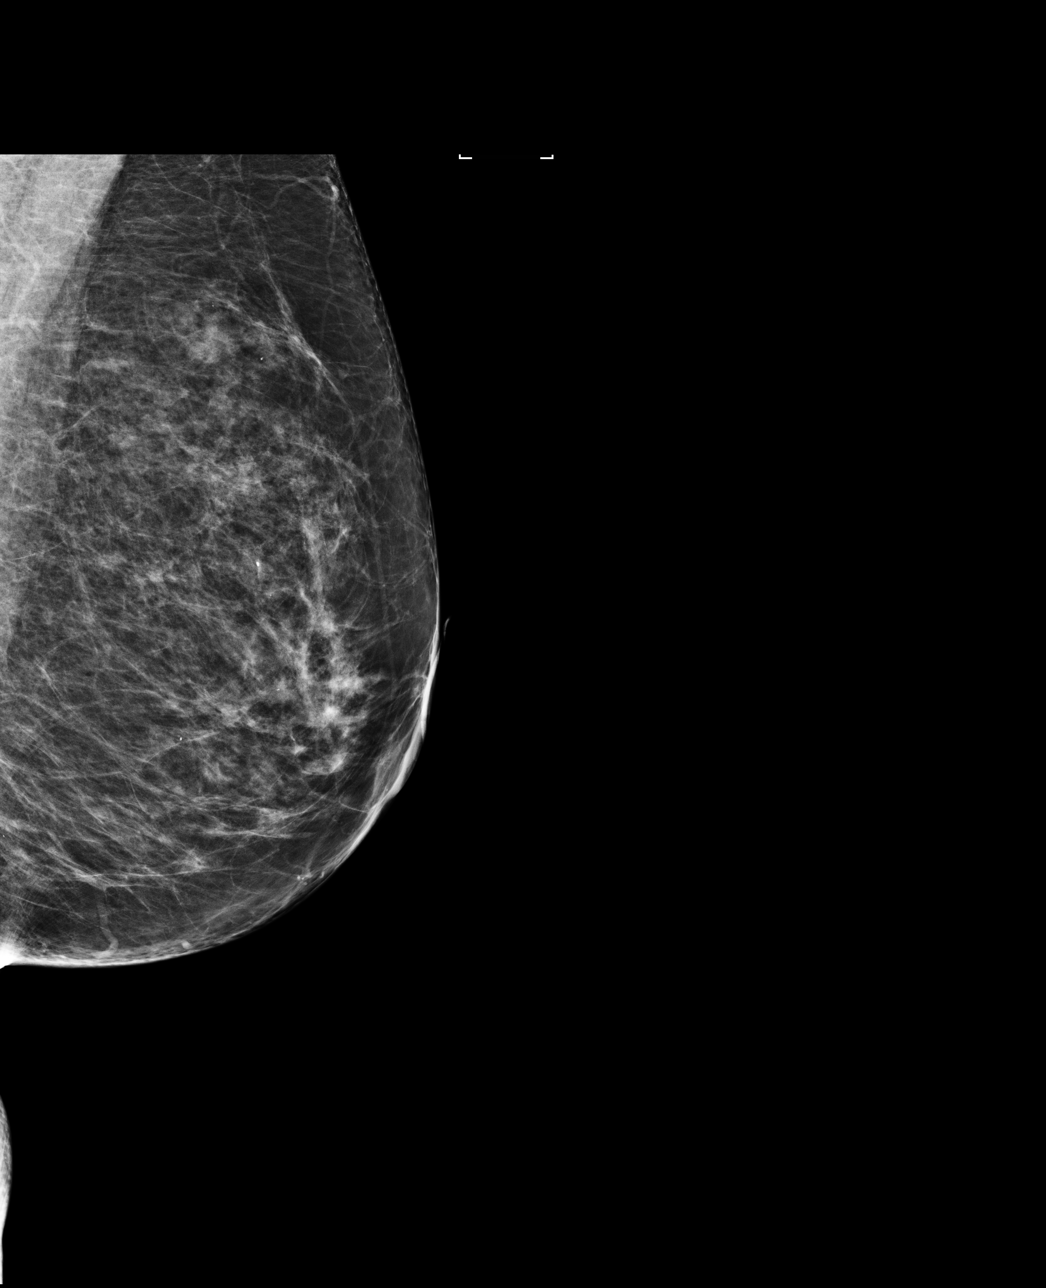

[R MLO]
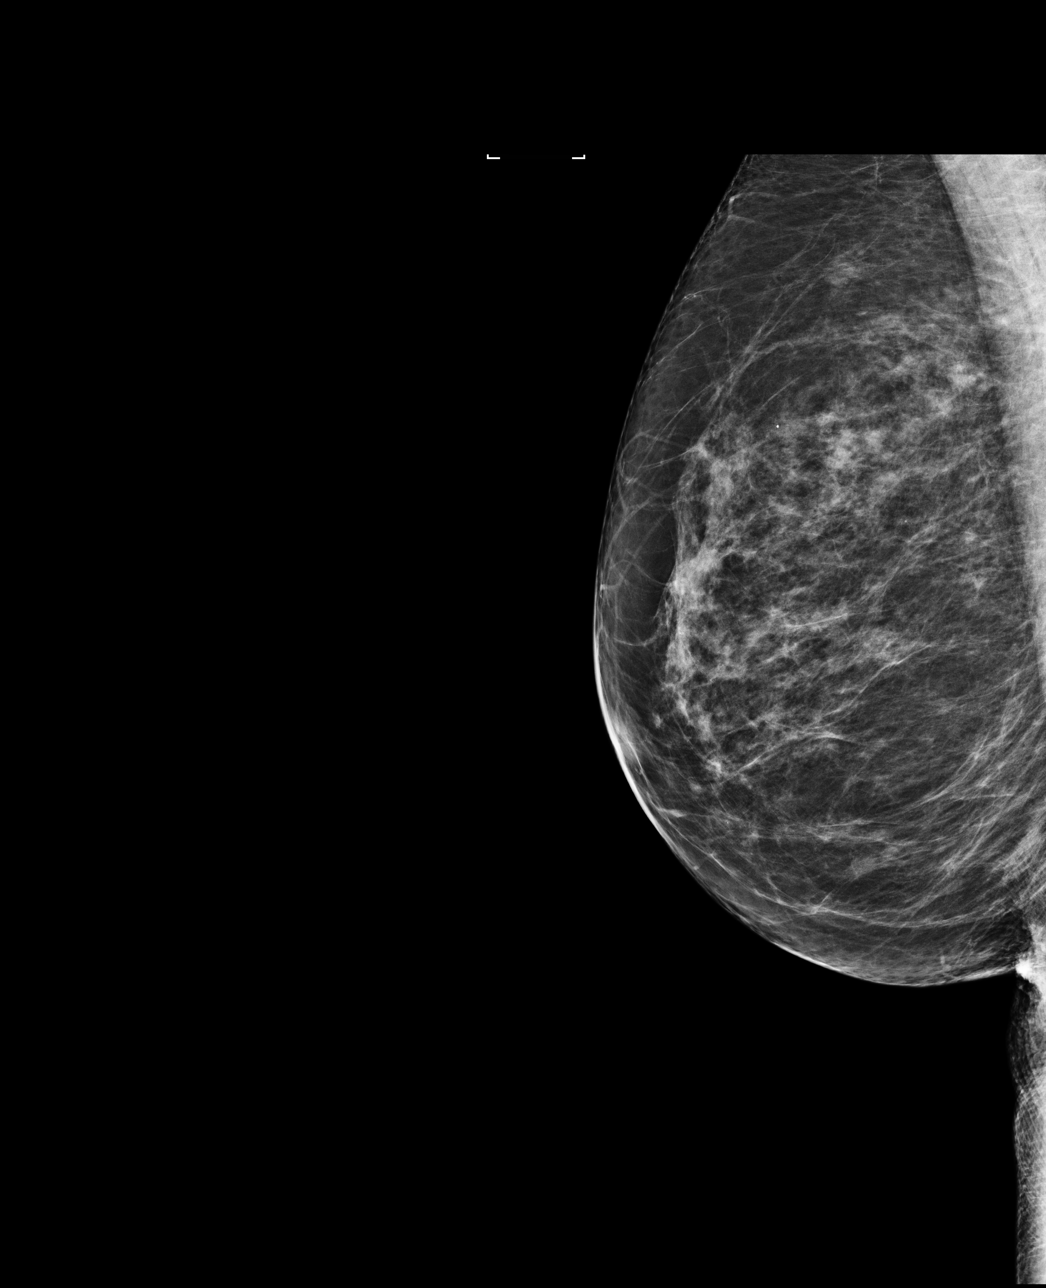

[4 of 4 positions shown; findings below may reference images not displayed]

ACR Breast Density Category b: There are scattered areas of
fibroglandular density.
FINDINGS: There are no findings suspicious for malignancy. Images were
processed with CAD.
IMPRESSION: No mammographic evidence of malignancy. A result letter of this
screening mammogram will be mailed directly to the patient.

RECOMMENDATION:
Screening mammogram in one year. (Code:AS-G-LCT)

BI-RADS CATEGORY  1: Negative.

## 2019-02-24 ENCOUNTER — Other Ambulatory Visit: Payer: Self-pay

## 2019-02-24 ENCOUNTER — Ambulatory Visit: Payer: Medicare HMO | Admitting: Physical Therapy

## 2019-02-24 ENCOUNTER — Encounter: Payer: Self-pay | Admitting: Physical Therapy

## 2019-02-24 DIAGNOSIS — M25661 Stiffness of right knee, not elsewhere classified: Secondary | ICD-10-CM | POA: Diagnosis not present

## 2019-02-24 DIAGNOSIS — M25561 Pain in right knee: Secondary | ICD-10-CM

## 2019-02-24 DIAGNOSIS — R262 Difficulty in walking, not elsewhere classified: Secondary | ICD-10-CM

## 2019-02-24 NOTE — Therapy (Signed)
[place], [place], [address] Phone: [phone]   Fax:  [phone]  Physical Therapy Treatment  Patient Details  Name: [name] MRN: [id number] Date of Birth: 04-Aug-1941 Referring Provider (PT): Mcarthur Rossetti, MD   Encounter Date: 02/24/2019  PT End of Session - 02/24/19 0933    Visit Number  14    Number of Visits  28    Date for PT Re-Evaluation  03/28/19    Authorization Type  aetna MCR, progress visit at 66    PT Start Time  0930    PT Stop Time  1027    PT Time Calculation (min)  57 min    Activity Tolerance  Patient tolerated treatment well    Behavior During Therapy  [facility] for tasks assessed/performed       Past Medical History:  Diagnosis Date  . Anxiety   . Arthritis    lower back, hip  . Back pain    generally "causes severe stiffness"  . Complication of anesthesia    "felt it made her feel very goofy for several monthS with general anesthetic"  . Depression   . GERD (gastroesophageal reflux disease)   . History of kidney stones 1988   x1 episode passed on own  . Hypertension   . 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 Osteopenia   . 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 APPENDECTOMY  2010   open  . BREAST LUMPECTOMY Left 1961   benign tumor  . TOTAL HIP ARTHROPLASTY  04/19/2012   Procedure: TOTAL HIP ARTHROPLASTY ANTERIOR APPROACH;  Surgeon: Mcarthur Rossetti, MD;  [facility];  Service: Orthopedics;  Laterality: Left;  Left Total Hip Arthroplasty  . TOTAL HIP ARTHROPLASTY Right 04/21/2016   Procedure: RIGHT TOTAL HIP ARTHROPLASTY ANTERIOR APPROACH;  Surgeon: Mcarthur Rossetti, MD;  [facility]: [facility];  Service: Orthopedics;  Laterality: Right;  . TOTAL KNEE ARTHROPLASTY Right 12/20/2018   Procedure: RIGHT TOTAL KNEE ARTHROPLASTY;  Surgeon: Mcarthur Rossetti, MD;  [facility]: [facility];  Service:  Orthopedics;  Laterality: Right;    There were no vitals filed for this visit.  Subjective Assessment - 02/24/19 0932    Subjective  Pt. c/o stiffness in knee but no pain this AM.    Currently in Pain?  No/denies                       [facility] Adult PT Treatment/Exercise - 02/24/19 0001      Knee/Hip Exercises: Aerobic   Recumbent Bike  6 min L1   L1 today due to initial stiffness/difficulty     Knee/Hip Exercises: Machines for Strengthening   Cybex Leg Press  Omega leg press 2 sets of 10 with 25 lbs.      Knee/Hip Exercises: Standing   Hip Abduction  Stengthening;Right;Left;2 sets;10 reps    Abduction Limitations  red band    Lateral Step Up  Right;2 sets;10 reps;Hand Hold: 2;Step Height: 6"    Forward Step Up  Right;2 sets;10 reps;Hand Hold: 1   at 4 way hip machine   Functional Squat  20 reps    Functional Squat Limitations  TRX    Rocker Board Limitations  1 min blue board side to side, x 1 min wooden board front/back    Other Standing Knee Exercises  Airex march with pauses in R SLS for 3-5  sec x 10      Knee/Hip Exercises: Seated   Long Arc Quad  Right;2 sets;10 reps    Long Arc Quad Weight  3 lbs.      Knee/Hip Exercises: Supine   Straight Leg Raises  Strengthening;Right;2 sets;10 reps    Straight Leg Raises Limitations  1 lb.      Vasopneumatic   Number Minutes Vasopneumatic   15 minutes    Vasopnuematic Location   Knee    Vasopneumatic Pressure  Medium    Vasopneumatic Temperature   34      Manual Therapy   Manual Therapy  Passive ROM    Manual therapy comments  patellar mobilization    Soft tissue mobilization  R distal quad    Passive ROM  R knee flex and ext in supine                  PT Long Term Goals - 02/14/19 0839      PT LONG TERM GOAL #1   Title  Pt will be I and compliant with HEP.    Baseline  compliant with current, will benefit from progression    Status  On-going    Target Date  03/28/19      PT LONG TERM GOAL  #2   Title  Pt will improve Rt knee ROM to 0-115 deg to improve functional abilities.    Baseline  see flowsheet    Status  On-going    Target Date  03/28/19      PT LONG TERM GOAL #3   Title  Pt will improve hip/knee strength to grossly 5/5 MMT tested in sitting.     Baseline  seeflowsheet    Status  On-going    Target Date  03/28/19      PT LONG TERM GOAL #4   Title  Pt will be able to ambulate communtiy distances and walk on uneven terrain with [facility] gait pattern and return to walking trails/hiking.     Baseline  able to do uphill/downhill on hard surface, has not tried trail    Status  On-going    Target Date  03/28/19      PT LONG TERM GOAL #5   Title  Pt will have less than 2/10 overall pain with usual activity.    Baseline  3/10 avg    Status  On-going    Target Date  03/28/19            Plan - 02/24/19 0938    Clinical Impression Statement  Continues with some stiffness but overall pt. continues to make good progress with knee strength and ROM gains and functional status with mobility/progress re: therapy goals.    Personal Factors and Comorbidities  Age;Comorbidity 1;Comorbidity 2;Comorbidity 3+    Comorbidities  WUX:LKGM Rt THA now with Rt leg longer and wears lift in Lt shoe 04/21/16, anx,backpain,dep,HTN,osteopenia    Examination-Activity Limitations  Squat;Stairs;Bend;Locomotion Level;Stand    Rehab Potential  Good    PT Frequency  3x / week    PT Duration  6 weeks    PT Treatment/Interventions  Cryotherapy;Electrical Stimulation;Iontophoresis 4mg /ml Dexamethasone;Moist Heat;Traction;Ultrasound;Gait training;Stair training;Therapeutic activities;Therapeutic exercise;Neuromuscular re-education;Passive range of motion;Dry needling;Manual techniques;Joint Manipulations;Taping    PT Next Visit Plan  joint mobs for flexion, extension stretching, bilateral hip strengthening- esp Lt    PT Home Exercise Plan  [id number] , HSS, heel slides, knee flexion lunge stretch, sit to  stands, stand march, SLS,  SL hip abd &  clams    Consulted and Agree with Plan of Care  Patient       Patient will benefit from skilled therapeutic intervention in order to improve the following deficits and impairments:  Decreased activity tolerance, Decreased endurance, Decreased range of motion, Abnormal gait, Decreased strength, Hypomobility, Difficulty walking, Increased edema, Impaired flexibility, Increased fascial restricitons, Pain  Visit Diagnosis: Stiffness of right knee, not elsewhere classified  Difficulty in walking, not elsewhere classified  Acute pain of right knee     Problem List Patient Active Problem List   Diagnosis Date Noted  . 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 Chronic pain of left knee 09/06/2017  . Unilateral primary osteoarthritis, left knee 09/06/2017  . Chronic pain of right knee 09/06/2017  . Leg length difference, acquired 03/06/2017  . Low back pain 03/06/2017  . Unilateral primary osteoarthritis, right knee 12/06/2016  . 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 Degenerative arthritis of hip 04/19/2012  . Anxiety   . Cancer ([place])   . Depression   . Arthritis   . RENAL CALCULUS, RIGHT 12/15/2008  . ABDOMINAL PAIN, UNSPECIFIED SITE 12/15/2008  . NEPHROLITHIASIS, HX OF 12/15/2008  . ANXIETY DEPRESSION 10/29/2007  . CONTACT DERMATITIS DUE TO SOLAR RADIATION 10/29/2007  . OSTEOARTHROSIS, LOCAL, PRIMARY, [facility] SITE 08/16/2007    Beaulah Dinning, PT, DPT 02/24/19 10:14 AM  [facility] [address] [place], [place], [address] Phone: [phone]   Fax:  [phone]  Name: [name] MRN: [id number] Date of Birth: 1941/07/12

## 2019-02-26 ENCOUNTER — Ambulatory Visit: Payer: Medicare HMO | Admitting: Physical Therapy

## 2019-02-26 ENCOUNTER — Other Ambulatory Visit: Payer: Self-pay

## 2019-02-26 DIAGNOSIS — M25661 Stiffness of right knee, not elsewhere classified: Secondary | ICD-10-CM

## 2019-02-26 DIAGNOSIS — M25561 Pain in right knee: Secondary | ICD-10-CM | POA: Diagnosis not present

## 2019-02-26 DIAGNOSIS — R262 Difficulty in walking, not elsewhere classified: Secondary | ICD-10-CM | POA: Diagnosis not present

## 2019-02-26 NOTE — Therapy (Signed)
[place], [place], [address] Phone: [phone]   Fax:  [phone]  Physical Therapy Treatment/MD progress note  Patient Details  Name: [name] MRN: [id number] Date of Birth: 16-Feb-1941 Referring Provider (PT): Mcarthur Rossetti, MD   Encounter Date: 02/26/2019  PT End of Session - 02/26/19 1204    Visit Number  15    Number of Visits  28    Date for PT Re-Evaluation  03/28/19    Authorization Type  aetna MCR, progress visit at 53    PT Start Time  1100    PT Stop Time  1145    PT Time Calculation (min)  45 min    Activity Tolerance  Patient tolerated treatment well    Behavior During Therapy  [facility] for tasks assessed/performed       Past Medical History:  Diagnosis Date  . Anxiety   . Arthritis    lower back, hip  . Back pain    generally "causes severe stiffness"  . Complication of anesthesia    "felt it made her feel very goofy for several monthS with general anesthetic"  . Depression   . GERD (gastroesophageal reflux disease)   . History of kidney stones 1988   x1 episode passed on own  . Hypertension   . 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 Osteopenia   . 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 APPENDECTOMY  2010   open  . BREAST LUMPECTOMY Left 1961   benign tumor  . TOTAL HIP ARTHROPLASTY  04/19/2012   Procedure: TOTAL HIP ARTHROPLASTY ANTERIOR APPROACH;  Surgeon: Mcarthur Rossetti, MD;  [facility]: [facility];  Service: Orthopedics;  Laterality: Left;  Left Total Hip Arthroplasty  . TOTAL HIP ARTHROPLASTY Right 04/21/2016   Procedure: RIGHT TOTAL HIP ARTHROPLASTY ANTERIOR APPROACH;  Surgeon: Mcarthur Rossetti, MD;  [facility];  Service: Orthopedics;  Laterality: Right;  . TOTAL KNEE ARTHROPLASTY Right 12/20/2018   Procedure: RIGHT TOTAL KNEE ARTHROPLASTY;  Surgeon: Mcarthur Rossetti, MD;  Location:  [facility];  Service: Orthopedics;  Laterality: Right;    There were no vitals filed for this visit.  Subjective Assessment - 02/26/19 1203    Subjective  Pt relays stiffness but no pain, she feels she is 75% back to normal and only missing ROM     Pertinent History  SNK:NLZJ Rt THA now with Rt leg longer and wears lift in Lt shoe 04/21/16, anx,backpain,dep,HTN,osteopenia    Patient Stated Goals  get my knee more ROM and get back to hiking    Currently in Pain?  No/denies         [facility] PT Assessment - 02/26/19 0001      Assessment   Medical Diagnosis  Rt TKA    Referring Provider (PT)  Mcarthur Rossetti, MD    Onset Date/Surgical Date  12/20/18    Next MD Visit  02/27/19      AROM   Right Knee Flexion  105   AAROM with strap     PROM   Right Knee Extension  -2    Right Knee Flexion  116      Strength   Right Hip Flexion  4+/5    Right Hip ABduction  4+/5    Right Knee Flexion  5/5    Right Knee Extension  5/5    Left Knee Flexion  5/5  Left Knee Extension  5/5                   [facility] Adult PT Treatment/Exercise - 02/26/19 0001      Knee/Hip Exercises: Stretches   Active Hamstring Stretch  Right;3 reps;30 seconds    Knee: Self-Stretch to increase Flexion  10 seconds;Right    Knee: Self-Stretch Limitations  standing lunge stretch and supine heelslides with stretch      Knee/Hip Exercises: Aerobic   Recumbent Bike  6 min L1      Knee/Hip Exercises: Machines for Strengthening   Cybex Leg Press  Omega leg press 2 sets of 15 with 25 lbs.      Knee/Hip Exercises: Seated   Sit to Sand  2 sets;10 reps;without UE support      Modalities   Modalities  --   pt declined     Manual Therapy   Manual therapy comments  knee PROM, STM, jt mobs             PT Education - 02/26/19 1204    Education Details  different ways to stretch her knee     Person(s) Educated  Patient    Methods  Explanation;Demonstration;Verbal cues    Comprehension   Verbalized understanding;Returned demonstration          PT Long Term Goals - 02/26/19 1205      PT LONG TERM GOAL #1   Title  Pt will be I and compliant with HEP.    Status  Achieved      PT LONG TERM GOAL #2   Title  Pt will improve Rt knee ROM to 0-115 deg to improve functional abilities.    Baseline  see flowsheet    Status  On-going      PT LONG TERM GOAL #3   Title  Pt will improve hip/knee strength to grossly 5/5 MMT tested in sitting.     Baseline  seeflowsheet    Status  On-going      PT LONG TERM GOAL #4   Title  Pt will be able to ambulate communtiy distances and walk on uneven terrain with [facility] gait pattern and return to walking trails/hiking.     Baseline  able to do uphill/downhill on hard surface, has not tried trail    Status  On-going      PT LONG TERM GOAL #5   Title  Pt will have less than 2/10 overall pain with usual activity.    Baseline  no more pain just stiffness    Status  Achieved            Plan - 02/26/19 1204    Clinical Impression Statement  She is doing quite well with PT and functional progress. She does still lack some Rt knee ROM but this is slowly improving with PT. She will continue to benefit from PT with focus on ROM.     Personal Factors and Comorbidities  Age;Comorbidity 1;Comorbidity 2;Comorbidity 3+    Comorbidities  VQX:IHWT Rt THA now with Rt leg longer and wears lift in Lt shoe 04/21/16, anx,backpain,dep,HTN,osteopenia    Examination-Activity Limitations  Squat;Stairs;Bend;Locomotion Level;Stand    Rehab Potential  Good    PT Frequency  3x / week    PT Duration  6 weeks    PT Treatment/Interventions  Cryotherapy;Electrical Stimulation;Iontophoresis 4mg /ml Dexamethasone;Moist Heat;Traction;Ultrasound;Gait training;Stair training;Therapeutic activities;Therapeutic exercise;Neuromuscular re-education;Passive range of motion;Dry needling;Manual techniques;Joint Manipulations;Taping    PT Next Visit Plan  joint mobs for flexion,  extension stretching, bilateral hip strengthening- esp Lt    PT Home Exercise Plan  [id number] , HSS, heel slides, knee flexion lunge stretch, sit to stands, stand march, SLS,  SL hip abd & clams    Consulted and Agree with Plan of Care  Patient       Patient will benefit from skilled therapeutic intervention in order to improve the following deficits and impairments:  Decreased activity tolerance, Decreased endurance, Decreased range of motion, Abnormal gait, Decreased strength, Hypomobility, Difficulty walking, Increased edema, Impaired flexibility, Increased fascial restricitons, Pain  Visit Diagnosis: Stiffness of right knee, not elsewhere classified  Difficulty in walking, not elsewhere classified  Acute pain of right knee     Problem List Patient Active Problem List   Diagnosis Date Noted  . 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 Chronic pain of left knee 09/06/2017  . Unilateral primary osteoarthritis, left knee 09/06/2017  . Chronic pain of right knee 09/06/2017  . Leg length difference, acquired 03/06/2017  . Low back pain 03/06/2017  . Unilateral primary osteoarthritis, right knee 12/06/2016  . 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 Degenerative arthritis of hip 04/19/2012  . Anxiety   . Cancer ([place])   . Depression   . Arthritis   . RENAL CALCULUS, RIGHT 12/15/2008  . ABDOMINAL PAIN, UNSPECIFIED SITE 12/15/2008  . NEPHROLITHIASIS, HX OF 12/15/2008  . ANXIETY DEPRESSION 10/29/2007  . CONTACT DERMATITIS DUE TO SOLAR RADIATION 10/29/2007  . OSTEOARTHROSIS, LOCAL, PRIMARY, [facility] SITE 08/16/2007    Debbe Odea ,PT,DPT 02/26/2019, 12:09 PM  [facility] [address] [place], [place], [address] Phone: [phone]   Fax:  [phone]  Name: [name] MRN: [id number] Date of Birth: 01-08-1941

## 2019-02-27 ENCOUNTER — Ambulatory Visit (INDEPENDENT_AMBULATORY_CARE_PROVIDER_SITE_OTHER): Payer: Medicare HMO | Admitting: Orthopaedic Surgery

## 2019-02-27 ENCOUNTER — Encounter (INDEPENDENT_AMBULATORY_CARE_PROVIDER_SITE_OTHER): Payer: Self-pay | Admitting: Orthopaedic Surgery

## 2019-02-27 DIAGNOSIS — Z96651 Presence of right artificial knee joint: Secondary | ICD-10-CM

## 2019-02-27 NOTE — Progress Notes (Signed)
The patient is here today now 65 days status a right total knee arthroplasty.  She is a very active 78 year old female and very motivated.  She has at least 3 more weeks of physical therapy to attend.  She is working on getting her knee motion back on her own as well but she is not satisfied yet with her motion is.  On exam today she lacks full extension by only a few degrees and her flexion in the office about 95 200 degrees.  She says that therapy is closer to 116.  The knee is swollen to be expected but no redness.  Calf is soft.  The knee does feel ligamentously stable.  I gave her reassurance that she is doing well and she continue to put herself.  I do not recommend any ablation under anesthesia since outpatient therapy is at least documented her flexion to 116 degrees at least once.  I think with swelling going down to continue aggressive therapy that she will continue to make progress.  We will see her back in 4 weeks to assess her progress.  No x-rays are needed.

## 2019-02-28 ENCOUNTER — Ambulatory Visit: Payer: Medicare HMO | Attending: Orthopaedic Surgery | Admitting: Physical Therapy

## 2019-02-28 ENCOUNTER — Other Ambulatory Visit: Payer: Self-pay

## 2019-02-28 DIAGNOSIS — M25561 Pain in right knee: Secondary | ICD-10-CM

## 2019-02-28 DIAGNOSIS — M25661 Stiffness of right knee, not elsewhere classified: Secondary | ICD-10-CM

## 2019-02-28 DIAGNOSIS — R262 Difficulty in walking, not elsewhere classified: Secondary | ICD-10-CM | POA: Diagnosis not present

## 2019-02-28 NOTE — Therapy (Signed)
[place], [place], [address] Phone: [phone]   Fax:  [phone]  Physical Therapy Treatment  Patient Details  Name: [name] MRN: [id number] Date of Birth: 07/16/1941 Referring Provider (PT): Mcarthur Rossetti, MD   Encounter Date: 02/28/2019  PT End of Session - 02/28/19 1027    Visit Number  15    Number of Visits  28    Date for PT Re-Evaluation  03/28/19    Authorization Type  aetna MCR, progress visit at 38, KX modifier    PT Start Time  0930    PT Stop Time  1015    PT Time Calculation (min)  45 min    Activity Tolerance  Patient tolerated treatment well    Behavior During Therapy  [facility] for tasks assessed/performed       Past Medical History:  Diagnosis Date  . Anxiety   . Arthritis    lower back, hip  . Back pain    generally "causes severe stiffness"  . Complication of anesthesia    "felt it made her feel very goofy for several monthS with general anesthetic"  . Depression   . GERD (gastroesophageal reflux disease)   . History of kidney stones 1988   x1 episode passed on own  . Hypertension   . 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 Osteopenia   . 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 APPENDECTOMY  2010   open  . BREAST LUMPECTOMY Left 1961   benign tumor  . TOTAL HIP ARTHROPLASTY  04/19/2012   Procedure: TOTAL HIP ARTHROPLASTY ANTERIOR APPROACH;  Surgeon: Mcarthur Rossetti, MD;  Location: [facility];  Service: Orthopedics;  Laterality: Left;  Left Total Hip Arthroplasty  . TOTAL HIP ARTHROPLASTY Right 04/21/2016   Procedure: RIGHT TOTAL HIP ARTHROPLASTY ANTERIOR APPROACH;  Surgeon: Mcarthur Rossetti, MD;  Location: [facility];  Service: Orthopedics;  Laterality: Right;  . TOTAL KNEE ARTHROPLASTY Right 12/20/2018   Procedure: RIGHT TOTAL KNEE ARTHROPLASTY;  Surgeon: Mcarthur Rossetti, MD;  [facility]: [facility];  Service: Orthopedics;  Laterality: Right;    There were no vitals filed for this visit.  Subjective Assessment - 02/28/19 1027    Subjective  Pt relays stiffness but no pain, says MD was satisfied with her progress    Pertinent History  OAC:ZYSA Rt THA now with Rt leg longer and wears lift in Lt shoe 04/21/16, anx,backpain,dep,HTN,osteopenia    Patient Stated Goals  get my knee more ROM and get back to hiking    Currently in Pain?  No/denies         [facility] PT Assessment - 02/28/19 0001      Assessment   Medical Diagnosis  Rt TKA    Referring Provider (PT)  Mcarthur Rossetti, MD    Onset Date/Surgical Date  12/20/18      AROM   Right Knee Flexion  105      PROM   Right Knee Extension  -2    Right Knee Flexion  117                   [facility] Adult PT Treatment/Exercise - 02/28/19 1036      Knee/Hip Exercises: Stretches   Active Hamstring Stretch  Right;3 reps;30 seconds    Quad Stretch  Right;3 reps;60 seconds    Quad Stretch Limitations  prone with strap, also supine  with strap thomas stretch 30 sec X 3 on Rt    Knee: Self-Stretch to increase Flexion  10 seconds;Right    Knee: Self-Stretch Limitations  standing lunge stretch and supine heelslides with stretch    Other Knee/Hip Stretches  kneeling stretch on mat table for flexion 30 sec  x3      Knee/Hip Exercises: Aerobic   Recumbent Bike  8 min L2      Knee/Hip Exercises: Machines for Strengthening   Cybex Leg Press  Omega leg press 2 sets of 15 with 25 lbs.      Knee/Hip Exercises: Standing   Step Down  15 reps;Hand Hold: 2;Step Height: 8"    Step Down Limitations  step down with Lt leg for Rt knee flexion ROM and strength      Knee/Hip Exercises: Seated   Sit to Sand  2 sets;10 reps;without UE support      Modalities   Modalities  --   pt declined     Manual Therapy   Manual therapy comments  knee PROM, STM, jt mobs                  PT Long Term Goals - 02/26/19 1205       PT LONG TERM GOAL #1   Title  Pt will be I and compliant with HEP.    Status  Achieved      PT LONG TERM GOAL #2   Title  Pt will improve Rt knee ROM to 0-115 deg to improve functional abilities.    Baseline  see flowsheet    Status  On-going      PT LONG TERM GOAL #3   Title  Pt will improve hip/knee strength to grossly 5/5 MMT tested in sitting.     Baseline  seeflowsheet    Status  On-going      PT LONG TERM GOAL #4   Title  Pt will be able to ambulate communtiy distances and walk on uneven terrain with [facility] gait pattern and return to walking trails/hiking.     Baseline  able to do uphill/downhill on hard surface, has not tried trail    Status  On-going      PT LONG TERM GOAL #5   Title  Pt will have less than 2/10 overall pain with usual activity.    Baseline  no more pain just stiffness    Status  Achieved            Plan - 02/28/19 1028    Clinical Impression Statement  Session focused on ROM, she is making more progress with extension than flexion so more time spent of flexion. She is improving PROM flexion but AROM flexion was same today. She was shown some different ways to stretch flexion today with strap in prone and supine, step downs, and kneeling flexion stetches added to her program. Continue POC    Personal Factors and Comorbidities  Age;Comorbidity 1;Comorbidity 2;Comorbidity 3+    Comorbidities  OMV:[facility] Rt THA now with Rt leg longer and wears lift in Lt shoe 04/21/16, anx,backpain,dep,HTN,osteopenia    Examination-Activity Limitations  Squat;Stairs;Bend;Locomotion Level;Stand    Rehab Potential  Good    PT Frequency  3x / week    PT Duration  6 weeks    PT Treatment/Interventions  Cryotherapy;Electrical Stimulation;Iontophoresis 4mg /ml Dexamethasone;Moist Heat;Traction;Ultrasound;Gait training;Stair training;Therapeutic activities;Therapeutic exercise;Neuromuscular re-education;Passive range of motion;Dry needling;Manual techniques;Joint Manipulations;Taping     PT Next Visit Plan  joint mobs for flexion, extension stretching, bilateral  hip strengthening- esp Lt    PT Home Exercise Plan  [id number] , HSS, heel slides, knee flexion lunge stretch, kneeling flexion stretch, quad stretch, sit to stands, stand march, SLS,  SL hip abd & clams    Consulted and Agree with Plan of Care  Patient       Patient will benefit from skilled therapeutic intervention in order to improve the following deficits and impairments:  Decreased activity tolerance, Decreased endurance, Decreased range of motion, Abnormal gait, Decreased strength, Hypomobility, Difficulty walking, Increased edema, Impaired flexibility, Increased fascial restricitons, Pain  Visit Diagnosis: Stiffness of right knee, not elsewhere classified  Difficulty in walking, not elsewhere classified  Acute pain of right knee     Problem List Patient Active Problem List   Diagnosis Date Noted  . 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 Chronic pain of left knee 09/06/2017  . Unilateral primary osteoarthritis, left knee 09/06/2017  . Chronic pain of right knee 09/06/2017  . Leg length difference, acquired 03/06/2017  . Low back pain 03/06/2017  . Unilateral primary osteoarthritis, right knee 12/06/2016  . 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 Degenerative arthritis of hip 04/19/2012  . Anxiety   . Cancer ([place])   . Depression   . Arthritis   . RENAL CALCULUS, RIGHT 12/15/2008  . ABDOMINAL PAIN, UNSPECIFIED SITE 12/15/2008  . NEPHROLITHIASIS, HX OF 12/15/2008  . ANXIETY DEPRESSION 10/29/2007  . CONTACT DERMATITIS DUE TO SOLAR RADIATION 10/29/2007  . 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

## 2019-03-03 ENCOUNTER — Encounter: Payer: Self-pay | Admitting: Physical Therapy

## 2019-03-03 ENCOUNTER — Ambulatory Visit: Payer: Medicare HMO | Admitting: Physical Therapy

## 2019-03-03 ENCOUNTER — Other Ambulatory Visit: Payer: Self-pay

## 2019-03-03 DIAGNOSIS — M25661 Stiffness of right knee, not elsewhere classified: Secondary | ICD-10-CM

## 2019-03-03 DIAGNOSIS — R262 Difficulty in walking, not elsewhere classified: Secondary | ICD-10-CM

## 2019-03-03 DIAGNOSIS — M25561 Pain in right knee: Secondary | ICD-10-CM | POA: Diagnosis not present

## 2019-03-03 NOTE — Patient Instructions (Signed)
Access Code: [email or web address]  URL: [email or web address]  Date: 03/03/2019  Prepared by: Selinda Eon   Exercises  Forward Step Down - 10 reps - 3 sets - 1x daily - 7x weekly  Hip Hiking on Step - 1x daily - 7x weekly  Sit to Stand with Ball Between Knees - 10 reps - 3 sets - 1x daily - 7x weekly

## 2019-03-03 NOTE — Therapy (Signed)
[place], [place], [address] Phone: [phone]   Fax:  [phone]  Physical Therapy Treatment  Patient Details  Name: [name] MRN: [id number] Date of Birth: 1941-03-19 Referring Provider (PT): Mcarthur Rossetti, MD   Encounter Date: 03/03/2019  PT End of Session - 03/03/19 6962    Visit Number  16    Number of Visits  28    Date for PT Re-Evaluation  03/28/19    Authorization Type  aetna MCR, progress visit at 24, KX modifier    PT Start Time  [phone]   pt arrived late   PT Stop Time  0900    PT Time Calculation (min)  41 min    Activity Tolerance  Patient tolerated treatment well    Behavior During Therapy  [facility] for tasks assessed/performed       Past Medical History:  Diagnosis Date  . Anxiety   . Arthritis    lower back, hip  . Back pain    generally "causes severe stiffness"  . Complication of anesthesia    "felt it made her feel very goofy for several monthS with general anesthetic"  . Depression   . GERD (gastroesophageal reflux disease)   . History of kidney stones 1988   x1 episode passed on own  . Hypertension   . 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 Osteopenia   . 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 APPENDECTOMY  2010   open  . BREAST LUMPECTOMY Left 1961   benign tumor  . TOTAL HIP ARTHROPLASTY  04/19/2012   Procedure: TOTAL HIP ARTHROPLASTY ANTERIOR APPROACH;  Surgeon: Mcarthur Rossetti, MD;  [facility];  Service: Orthopedics;  Laterality: Left;  Left Total Hip Arthroplasty  . TOTAL HIP ARTHROPLASTY Right 04/21/2016   Procedure: RIGHT TOTAL HIP ARTHROPLASTY ANTERIOR APPROACH;  Surgeon: Mcarthur Rossetti, MD;  [facility];  Service: Orthopedics;  Laterality: Right;  . TOTAL KNEE ARTHROPLASTY Right 12/20/2018   Procedure: RIGHT TOTAL KNEE ARTHROPLASTY;  Surgeon: Mcarthur Rossetti,  MD;  [facility];  Service: Orthopedics;  Laterality: Right;    There were no vitals filed for this visit.  Subjective Assessment - 03/03/19 0821    Subjective  Has been doing child pose. Stiff but no pain today. Putting my socks on is getting easier, staris are hard, some chairs are ok, spending a lot of time on a hard surface-the knee gets tired(30-45 min). did ok on an uneven surface yesterday.     Currently in Pain?  No/denies                       [facility] Adult PT Treatment/Exercise - 03/03/19 0001      Knee/Hip Exercises: Stretches   Passive Hamstring Stretch  Both;2 reps;30 seconds    Knee: Self-Stretch Limitations  supine curl in with strap 5x10s      Knee/Hip Exercises: Aerobic   Elliptical  5 min L1 ramp 5      Knee/Hip Exercises: Standing   Step Down Limitations  fwd step down on steps to heel tap    Other Standing Knee Exercises  hip hike edge of step      Knee/Hip Exercises: Seated   Sit to Sand  without UE support   eccentric sit ball bw knees  PT Education - 03/03/19 0859    Education Details  anatomy of knee, sensations of stiffness    Person(s) Educated  Patient    Methods  Explanation;Demonstration;Tactile cues;Verbal cues;Handout    Comprehension  Verbalized understanding;Need further instruction;Returned demonstration;Verbal cues required;Tactile cues required          PT Long Term Goals - 02/26/19 1205      PT LONG TERM GOAL #1   Title  Pt will be I and compliant with HEP.    Status  Achieved      PT LONG TERM GOAL #2   Title  Pt will improve Rt knee ROM to 0-115 deg to improve functional abilities.    Baseline  see flowsheet    Status  On-going      PT LONG TERM GOAL #3   Title  Pt will improve hip/knee strength to grossly 5/5 MMT tested in sitting.     Baseline  seeflowsheet    Status  On-going      PT LONG TERM GOAL #4   Title  Pt will be able to ambulate communtiy distances and walk on uneven terrain  with [facility] gait pattern and return to walking trails/hiking.     Baseline  able to do uphill/downhill on hard surface, has not tried trail    Status  On-going      PT LONG TERM GOAL #5   Title  Pt will have less than 2/10 overall pain with usual activity.    Baseline  no more pain just stiffness    Status  Achieved            Plan - 03/03/19 1002    Clinical Impression Statement  Pt was encouraged after session today and we addressed specific difficulties that she is having during the day. Heavy cues required for form.     PT Treatment/Interventions  Cryotherapy;Electrical Stimulation;Iontophoresis 4mg /ml Dexamethasone;Moist Heat;Traction;Ultrasound;Gait training;Stair training;Therapeutic activities;Therapeutic exercise;Neuromuscular re-education;Passive range of motion;Dry needling;Manual techniques;Joint Manipulations;Taping    PT Next Visit Plan  review steps/stairs, add bridges with ER challenges    PT Home Exercise Plan  [id number] , HSS, heel slides, knee flexion lunge stretch, kneeling flexion stretch, quad stretch, sit to stands, stand march, SLS,  SL hip abd & clams; fwd step down; hip hike    Consulted and Agree with Plan of Care  Patient       Patient will benefit from skilled therapeutic intervention in order to improve the following deficits and impairments:  Decreased activity tolerance, Decreased endurance, Decreased range of motion, Abnormal gait, Decreased strength, Hypomobility, Difficulty walking, Increased edema, Impaired flexibility, Increased fascial restricitons, Pain  Visit Diagnosis: Stiffness of right knee, not elsewhere classified  Difficulty in walking, not elsewhere classified  Acute pain of right knee     Problem List Patient Active Problem List   Diagnosis Date Noted  . 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 Chronic pain of left knee 09/06/2017  . Unilateral primary osteoarthritis, left knee 09/06/2017  . Chronic pain of right  knee 09/06/2017  . Leg length difference, acquired 03/06/2017  . Low back pain 03/06/2017  . Unilateral primary osteoarthritis, right knee 12/06/2016  . 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 Degenerative arthritis of hip 04/19/2012  . Anxiety   . Cancer ([place])   . Depression   . Arthritis   . RENAL CALCULUS, RIGHT 12/15/2008  . ABDOMINAL PAIN, UNSPECIFIED SITE 12/15/2008  . NEPHROLITHIASIS, HX OF 12/15/2008  . ANXIETY  DEPRESSION 10/29/2007  . CONTACT DERMATITIS DUE TO SOLAR RADIATION 10/29/2007  . OSTEOARTHROSIS, LOCAL, PRIMARY, [facility] SITE 08/16/2007    Al Gagen C. Jadalee Westcott PT, DPT 03/03/19 10:05 AM   [place], [place], [address] Phone: [phone]   Fax:  [phone]  Name: [name] MRN: [id number] Date of Birth: May 28, 1941

## 2019-03-05 ENCOUNTER — Other Ambulatory Visit: Payer: Self-pay

## 2019-03-05 ENCOUNTER — Encounter: Payer: Self-pay | Admitting: Physical Therapy

## 2019-03-05 ENCOUNTER — Ambulatory Visit: Payer: Medicare HMO | Admitting: Physical Therapy

## 2019-03-05 DIAGNOSIS — M25661 Stiffness of right knee, not elsewhere classified: Secondary | ICD-10-CM | POA: Diagnosis not present

## 2019-03-05 DIAGNOSIS — R262 Difficulty in walking, not elsewhere classified: Secondary | ICD-10-CM | POA: Diagnosis not present

## 2019-03-05 DIAGNOSIS — M25561 Pain in right knee: Secondary | ICD-10-CM

## 2019-03-05 NOTE — Therapy (Signed)
[place], [place], [address] Phone: [phone]   Fax:  [phone]  Physical Therapy Treatment  Patient Details  Name: [name] MRN: [id number] Date of Birth: Mar 26, 1941 Referring Provider (PT): Mcarthur Rossetti, MD   Encounter Date: 03/05/2019  PT End of Session - 03/05/19 0928    Visit Number  17    Number of Visits  28    Date for PT Re-Evaluation  03/28/19    Authorization Type  aetna MCR, progress visit at 37, KX modifier    PT Start Time  0930    PT Stop Time  1015    PT Time Calculation (min)  45 min    Activity Tolerance  Patient tolerated treatment well    Behavior During Therapy  [facility] for tasks assessed/performed       Past Medical History:  Diagnosis Date  . Anxiety   . Arthritis    lower back, hip  . Back pain    generally "causes severe stiffness"  . Complication of anesthesia    "felt it made her feel very goofy for several monthS with general anesthetic"  . Depression   . GERD (gastroesophageal reflux disease)   . History of kidney stones 1988   x1 episode passed on own  . Hypertension   . 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 Osteopenia   . 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 APPENDECTOMY  2010   open  . BREAST LUMPECTOMY Left 1961   benign tumor  . TOTAL HIP ARTHROPLASTY  04/19/2012   Procedure: TOTAL HIP ARTHROPLASTY ANTERIOR APPROACH;  Surgeon: Mcarthur Rossetti, MD;  [facility];  Service: Orthopedics;  Laterality: Left;  Left Total Hip Arthroplasty  . TOTAL HIP ARTHROPLASTY Right 04/21/2016   Procedure: RIGHT TOTAL HIP ARTHROPLASTY ANTERIOR APPROACH;  Surgeon: Mcarthur Rossetti, MD;  [facility];  Service: Orthopedics;  Laterality: Right;  . TOTAL KNEE ARTHROPLASTY Right 12/20/2018   Procedure: RIGHT TOTAL KNEE ARTHROPLASTY;  Surgeon: Mcarthur Rossetti, MD;  [facility];  Service: Orthopedics;  Laterality: Right;    There were no vitals filed for this visit.  Subjective Assessment - 03/05/19 0929    Subjective  "I am getting alittle frustrated becuase of the swelling, no complaints of pain today"     Currently in Pain?  No/denies    Pain Score  0-No pain    Pain Location  Knee    Pain Orientation  Right    Pain Descriptors / Indicators  Aching    Pain Type  Surgical pain    Pain Onset  More than a month ago    Pain Frequency  Intermittent    Aggravating Factors   laying down at night         [facility] PT Assessment - 03/05/19 0939      AROM   Right Knee Flexion  107   111 following manual                  [facility] Adult PT Treatment/Exercise - 03/05/19 0001      Knee/Hip Exercises: Stretches   Passive Hamstring Stretch  Both;2 reps;30 seconds    Quad Stretch  2 reps;30 seconds    Quad Stretch Limitations  prone with strap      Knee/Hip Exercises: Aerobic   Elliptical  5 min L1 ramp 5  Recumbent Bike  L3 x 6 min   lowering seat every 2 min     Knee/Hip Exercises: Machines for Strengthening   Cybex Leg Press  2 x 15 40#      Knee/Hip Exercises: Standing   Lateral Step Up  2 sets;10 reps;Step Height: 6"    Step Down  4 sets;15 reps;Step Height: 6";Right    Stairs  5 x up/down on the 6inch steps      Manual Therapy   Manual Therapy  Joint mobilization    Joint Mobilization  grade 3 A/P with pt actively bending between sets    Passive ROM  R knee flex and ext in supine          Balance Exercises - 03/05/19 1011      Balance Exercises: Standing   Tandem Gait  Forward;Retro;4 reps   in //             PT Long Term Goals - 02/26/19 1205      PT LONG TERM GOAL #1   Title  Pt will be I and compliant with HEP.    Status  Achieved      PT LONG TERM GOAL #2   Title  Pt will improve Rt knee ROM to 0-115 deg to improve functional abilities.    Baseline  see flowsheet    Status  On-going      PT LONG TERM  GOAL #3   Title  Pt will improve hip/knee strength to grossly 5/5 MMT tested in sitting.     Baseline  seeflowsheet    Status  On-going      PT LONG TERM GOAL #4   Title  Pt will be able to ambulate communtiy distances and walk on uneven terrain with [facility] gait pattern and return to walking trails/hiking.     Baseline  able to do uphill/downhill on hard surface, has not tried trail    Status  On-going      PT LONG TERM GOAL #5   Title  Pt will have less than 2/10 overall pain with usual activity.    Baseline  no more pain just stiffness    Status  Achieved            Plan - 03/05/19 1014    Clinical Impression Statement  Continued working on ROM and pt improved to 112 degrees today. continued working on strengthening which pt required encouragement due to frustration. Overall she did well and reports no pain at end of session.     PT Treatment/Interventions  Cryotherapy;Electrical Stimulation;Iontophoresis 4mg /ml Dexamethasone;Moist Heat;Traction;Ultrasound;Gait training;Stair training;Therapeutic activities;Therapeutic exercise;Neuromuscular re-education;Passive range of motion;Dry needling;Manual techniques;Joint Manipulations;Taping    PT Next Visit Plan  review steps/stairs, add bridges with ER challenges    PT Home Exercise Plan  [id number] , HSS, heel slides, knee flexion lunge stretch, kneeling flexion stretch, quad stretch, sit to stands, stand march, SLS,  SL hip abd & clams; fwd step down; hip hike    Consulted and Agree with Plan of Care  Patient       Patient will benefit from skilled therapeutic intervention in order to improve the following deficits and impairments:  Decreased activity tolerance, Decreased endurance, Decreased range of motion, Abnormal gait, Decreased strength, Hypomobility, Difficulty walking, Increased edema, Impaired flexibility, Increased fascial restricitons, Pain  Visit Diagnosis: Stiffness of right knee, not elsewhere classified  Difficulty in  walking, not elsewhere classified  Acute pain of right knee     Problem List Patient  Active Problem List   Diagnosis Date Noted  . 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 Chronic pain of left knee 09/06/2017  . Unilateral primary osteoarthritis, left knee 09/06/2017  . Chronic pain of right knee 09/06/2017  . Leg length difference, acquired 03/06/2017  . Low back pain 03/06/2017  . Unilateral primary osteoarthritis, right knee 12/06/2016  . 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 Degenerative arthritis of hip 04/19/2012  . Anxiety   . Cancer ([place])   . Depression   . Arthritis   . RENAL CALCULUS, RIGHT 12/15/2008  . ABDOMINAL PAIN, UNSPECIFIED SITE 12/15/2008  . NEPHROLITHIASIS, HX OF 12/15/2008  . ANXIETY DEPRESSION 10/29/2007  . CONTACT DERMATITIS DUE TO SOLAR RADIATION 10/29/2007  . OSTEOARTHROSIS, LOCAL, PRIMARY, [facility] SITE 08/16/2007   Starr Lake PT, DPT, LAT, ATC  03/05/19  10:17 AM      [employer] [facility] [address] [place], [place], [address] Phone: [phone]   Fax:  [phone]  Name: [name] MRN: [id number] Date of Birth: Feb 08, 1941

## 2019-03-07 ENCOUNTER — Ambulatory Visit: Payer: Medicare HMO | Admitting: Physical Therapy

## 2019-03-07 ENCOUNTER — Other Ambulatory Visit: Payer: Self-pay

## 2019-03-07 DIAGNOSIS — R262 Difficulty in walking, not elsewhere classified: Secondary | ICD-10-CM

## 2019-03-07 DIAGNOSIS — M25661 Stiffness of right knee, not elsewhere classified: Secondary | ICD-10-CM | POA: Diagnosis not present

## 2019-03-07 DIAGNOSIS — M25561 Pain in right knee: Secondary | ICD-10-CM | POA: Diagnosis not present

## 2019-03-07 NOTE — Therapy (Signed)
[place], [place], [address] Phone: [phone]   Fax:  [phone]  Physical Therapy Treatment  Patient Details  Name: [name] MRN: [id number] Date of Birth: 1941-02-01 Referring Provider (PT): Mcarthur Rossetti, MD   Encounter Date: 03/07/2019  PT End of Session - 03/07/19 1034    Visit Number  18    Number of Visits  28    Date for PT Re-Evaluation  03/28/19    Authorization Type  aetna MCR, progress visit at 17, KX modifier    PT Start Time  0930    PT Stop Time  1016    PT Time Calculation (min)  46 min    Activity Tolerance  Patient tolerated treatment well    Behavior During Therapy  [facility] for tasks assessed/performed       Past Medical History:  Diagnosis Date  . Anxiety   . Arthritis    lower back, hip  . Back pain    generally "causes severe stiffness"  . Complication of anesthesia    "felt it made her feel very goofy for several monthS with general anesthetic"  . Depression   . GERD (gastroesophageal reflux disease)   . History of kidney stones 1988   x1 episode passed on own  . Hypertension   . 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 Osteopenia   . 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 APPENDECTOMY  2010   open  . BREAST LUMPECTOMY Left 1961   benign tumor  . TOTAL HIP ARTHROPLASTY  04/19/2012   Procedure: TOTAL HIP ARTHROPLASTY ANTERIOR APPROACH;  Surgeon: Mcarthur Rossetti, MD;  Location: [facility];  Service: Orthopedics;  Laterality: Left;  Left Total Hip Arthroplasty  . TOTAL HIP ARTHROPLASTY Right 04/21/2016   Procedure: RIGHT TOTAL HIP ARTHROPLASTY ANTERIOR APPROACH;  Surgeon: Mcarthur Rossetti, MD;  Location: [facility];  Service: Orthopedics;  Laterality: Right;  . TOTAL KNEE ARTHROPLASTY Right 12/20/2018   Procedure: RIGHT TOTAL KNEE ARTHROPLASTY;  Surgeon: Mcarthur Rossetti, MD;  Location: [facility];  Service: Orthopedics;  Laterality: Right;    There were no vitals filed for this visit.  Subjective Assessment - 03/07/19 0942    Subjective  "the knee is a little irritated today"    Patient Stated Goals  get my knee more ROM and get back to hiking    Currently in Pain?  Yes    Pain Score  2     Pain Location  Knee    Pain Orientation  Right    Pain Descriptors / Indicators  Tightness;Sore         [facility] PT Assessment - 03/07/19 0001      Assessment   Medical Diagnosis  Rt TKA    Referring Provider (PT)  Mcarthur Rossetti, MD    Onset Date/Surgical Date  12/20/18      PROM   Right Knee Flexion  118                   [facility] Adult PT Treatment/Exercise - 03/07/19 0001      Knee/Hip Exercises: Stretches   Passive Hamstring Stretch  Right;3 reps;30 seconds    Quad Stretch  Right;3 reps;30 seconds    Quad Stretch Limitations  supine with strap leg of edge of table    Knee: Self-Stretch Limitations  lunge stretch on step 10 sec X10, kneeling  prayer stretch 30 sec X3 on low mat table    Gastroc Stretch  Both;2 reps;30 seconds      Knee/Hip Exercises: Aerobic   Elliptical  5 min L1 ramp 5      Knee/Hip Exercises: Machines for Strengthening   Cybex Knee Extension  5 lbs 2X10 bilat    Cybex Knee Flexion  20 lbs 2X15    Cybex Leg Press  omega leg press 45 lbs with both 2X10       Knee/Hip Exercises: Standing   Lateral Step Up  Right;15 reps;Hand Hold: 1    Step Down  Right;15 reps;Step Height: 6";Hand Hold: 2    Stairs  5 x up/down on the 6inch steps      Manual Therapy   Manual Therapy  Joint mobilization    Joint Mobilization  grade 3 A/P with pt actively bending between sets    Passive ROM  R knee flex and ext in supine                  PT Long Term Goals - 02/26/19 1205      PT LONG TERM GOAL #1   Title  Pt will be I and compliant with HEP.    Status  Achieved      PT LONG TERM GOAL #2   Title  Pt will improve Rt knee ROM to  0-115 deg to improve functional abilities.    Baseline  see flowsheet    Status  On-going      PT LONG TERM GOAL #3   Title  Pt will improve hip/knee strength to grossly 5/5 MMT tested in sitting.     Baseline  seeflowsheet    Status  On-going      PT LONG TERM GOAL #4   Title  Pt will be able to ambulate communtiy distances and walk on uneven terrain with [facility] gait pattern and return to walking trails/hiking.     Baseline  able to do uphill/downhill on hard surface, has not tried trail    Status  On-going      PT LONG TERM GOAL #5   Title  Pt will have less than 2/10 overall pain with usual activity.    Baseline  no more pain just stiffness    Status  Achieved            Plan - 03/07/19 1034    Clinical Impression Statement  Pt still has chief complaint of knee stiffness and most of session was on ROM and she did show improved PROM to 118 at end of session however AROM continues to be more limited.  She did well with stairs today and strengthening program. COntinue POC.    PT Treatment/Interventions  Cryotherapy;Electrical Stimulation;Iontophoresis 4mg /ml Dexamethasone;Moist Heat;Traction;Ultrasound;Gait training;Stair training;Therapeutic activities;Therapeutic exercise;Neuromuscular re-education;Passive range of motion;Dry needling;Manual techniques;Joint Manipulations;Taping    PT Next Visit Plan  review steps/stairs, add bridges with ER challenges    PT Home Exercise Plan  [id number] , HSS, heel slides, knee flexion lunge stretch, kneeling flexion stretch, quad stretch, sit to stands, stand march, SLS,  SL hip abd & clams; fwd step down; hip hike    Consulted and Agree with Plan of Care  Patient       Patient will benefit from skilled therapeutic intervention in order to improve the following deficits and impairments:  Decreased activity tolerance, Decreased endurance, Decreased range of motion, Abnormal gait, Decreased strength, Hypomobility, Difficulty walking, Increased edema,  Impaired flexibility, Increased fascial restricitons,  Pain  Visit Diagnosis: Stiffness of right knee, not elsewhere classified  Difficulty in walking, not elsewhere classified  Acute pain of right knee     Problem List Patient Active Problem List   Diagnosis Date Noted  . 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 Chronic pain of left knee 09/06/2017  . Unilateral primary osteoarthritis, left knee 09/06/2017  . Chronic pain of right knee 09/06/2017  . Leg length difference, acquired 03/06/2017  . Low back pain 03/06/2017  . Unilateral primary osteoarthritis, right knee 12/06/2016  . 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 Degenerative arthritis of hip 04/19/2012  . Anxiety   . Cancer ([place])   . Depression   . Arthritis   . RENAL CALCULUS, RIGHT 12/15/2008  . ABDOMINAL PAIN, UNSPECIFIED SITE 12/15/2008  . NEPHROLITHIASIS, HX OF 12/15/2008  . ANXIETY DEPRESSION 10/29/2007  . CONTACT DERMATITIS DUE TO SOLAR RADIATION 10/29/2007  . 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 [place], [place], [address] Phone: [phone]   Fax:  [phone]  Name: [name] MRN: [id number] Date of Birth: 21-Jul-1941

## 2019-03-11 ENCOUNTER — Other Ambulatory Visit: Payer: Self-pay

## 2019-03-11 ENCOUNTER — Ambulatory Visit: Payer: Medicare HMO | Admitting: Physical Therapy

## 2019-03-11 DIAGNOSIS — I10 Essential (primary) hypertension: Secondary | ICD-10-CM | POA: Diagnosis not present

## 2019-03-11 DIAGNOSIS — M25661 Stiffness of right knee, not elsewhere classified: Secondary | ICD-10-CM | POA: Diagnosis not present

## 2019-03-11 DIAGNOSIS — R350 Frequency of micturition: Secondary | ICD-10-CM | POA: Diagnosis not present

## 2019-03-11 DIAGNOSIS — M25561 Pain in right knee: Secondary | ICD-10-CM

## 2019-03-11 DIAGNOSIS — E7800 Pure hypercholesterolemia, unspecified: Secondary | ICD-10-CM | POA: Diagnosis not present

## 2019-03-11 DIAGNOSIS — Z0000 Encounter for general adult medical examination without abnormal findings: Secondary | ICD-10-CM | POA: Diagnosis not present

## 2019-03-11 DIAGNOSIS — R69 Illness, unspecified: Secondary | ICD-10-CM | POA: Diagnosis not present

## 2019-03-11 DIAGNOSIS — R262 Difficulty in walking, not elsewhere classified: Secondary | ICD-10-CM | POA: Diagnosis not present

## 2019-03-11 NOTE — Therapy (Signed)
[place], [place], [address] Phone: [phone]   Fax:  [phone]  Physical Therapy Treatment  Patient Details  Name: [name] MRN: [id number] Date of Birth: [birth year] Referring Provider (PT): Mcarthur Rossetti, MD   Encounter Date: 03/11/2019  PT End of Session - 03/11/19 1022    Visit Number  19    Number of Visits  28    Date for PT Re-Evaluation  03/28/19    Authorization Type  aetna MCR, progress visit at 5, KX modifier    PT Start Time  0930    PT Stop Time  1013    PT Time Calculation (min)  43 min    Activity Tolerance  Patient tolerated treatment well    Behavior During Therapy  [facility] for tasks assessed/performed       Past Medical History:  Diagnosis Date  . Anxiety   . Arthritis    lower back, hip  . Back pain    generally "causes severe stiffness"  . Complication of anesthesia    "felt it made her feel very goofy for several monthS with general anesthetic"  . Depression   . GERD (gastroesophageal reflux disease)   . History of kidney stones 1988   x1 episode passed on own  . Hypertension   . 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 Osteopenia   . 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 APPENDECTOMY  2010   open  . BREAST LUMPECTOMY Left 1961   benign tumor  . TOTAL HIP ARTHROPLASTY  04/19/2012   Procedure: TOTAL HIP ARTHROPLASTY ANTERIOR APPROACH;  Surgeon: Mcarthur Rossetti, MD;  [facility]: [facility];  Service: Orthopedics;  Laterality: Left;  Left Total Hip Arthroplasty  . TOTAL HIP ARTHROPLASTY Right 04/21/2016   Procedure: RIGHT TOTAL HIP ARTHROPLASTY ANTERIOR APPROACH;  Surgeon: Mcarthur Rossetti, MD;  [facility]: [facility];  Service: Orthopedics;  Laterality: Right;  . TOTAL KNEE ARTHROPLASTY Right 12/20/2018   Procedure: RIGHT TOTAL KNEE ARTHROPLASTY;  Surgeon: Mcarthur Rossetti, MD;  [facility];  Service: Orthopedics;  Laterality: Right;    There were no vitals filed for this visit.  Subjective Assessment - 03/11/19 1013    Subjective  the skin feels tight around my knee but overall it feels better    Currently in Pain?  No/denies         [facility] PT Assessment - 03/11/19 0001      Assessment   Medical Diagnosis  Rt TKA    Referring Provider (PT)  Mcarthur Rossetti, MD    Onset Date/Surgical Date  12/20/18      AROM   Right Knee Flexion  110      PROM   Right Knee Flexion  120                   [facility] Adult PT Treatment/Exercise - 03/11/19 0001      Knee/Hip Exercises: Stretches   Passive Hamstring Stretch  Right;3 reps;30 seconds    Quad Stretch  Right;3 reps;30 seconds    Quad Stretch Limitations  supine with strap leg of edge of table    Knee: Self-Stretch Limitations  lunge stretch on step 10 sec X10, kneeling prayer stretch 30 sec X3 on low mat table      Knee/Hip Exercises: Aerobic   Elliptical  2 min L1    Nustep  6  min L5 LE/UE      Knee/Hip Exercises: Standing   Hip Flexion  Both;20 reps;Knee bent    Hip Flexion Limitations  3 lbs    Hip Abduction  Both;20 reps;Knee straight    Abduction Limitations  3 lbs    Hip Extension  Both;20 reps;Knee straight    Extension Limitations  3lbs    Lateral Step Up  Right;15 reps;Step Height: 6"    Step Down  Right;15 reps;Step Height: 6";Hand Hold: 2    Stairs  5 x up/down on the 6inch steps      Manual Therapy   Joint Mobilization  grade 3 A/P with pt actively bending between sets, patella mobs    Soft tissue mobilization  STM/IASTM to R distal quad    Passive ROM  R knee flex and ext in supine                  PT Long Term Goals - 02/26/19 1205      PT LONG TERM GOAL #1   Title  Pt will be I and compliant with HEP.    Status  Achieved      PT LONG TERM GOAL #2   Title  Pt will improve Rt knee ROM to 0-115 deg to improve functional abilities.    Baseline  see flowsheet     Status  On-going      PT LONG TERM GOAL #3   Title  Pt will improve hip/knee strength to grossly 5/5 MMT tested in sitting.     Baseline  seeflowsheet    Status  On-going      PT LONG TERM GOAL #4   Title  Pt will be able to ambulate communtiy distances and walk on uneven terrain with [facility] gait pattern and return to walking trails/hiking.     Baseline  able to do uphill/downhill on hard surface, has not tried trail    Status  On-going      PT LONG TERM GOAL #5   Title  Pt will have less than 2/10 overall pain with usual activity.    Baseline  no more pain just stiffness    Status  Achieved            Plan - 03/11/19 1023    Clinical Impression Statement  Pt able to improve PROM and AROM after manual therapy. She had 120 deg PROM and 110 deg AROM for Rt knee flexion ROM showing great progress over the last week. Continued to work on endurance, knee and hip strengthening today without complaints. Continue POC.    PT Treatment/Interventions  Cryotherapy;Electrical Stimulation;Iontophoresis 4mg /ml Dexamethasone;Moist Heat;Traction;Ultrasound;Gait training;Stair training;Therapeutic activities;Therapeutic exercise;Neuromuscular re-education;Passive range of motion;Dry needling;Manual techniques;Joint Manipulations;Taping    PT Next Visit Plan  knee ROM, hip and knee strength and endurance    PT Home Exercise Plan  [id number] , HSS, heel slides, knee flexion lunge stretch, kneeling flexion stretch, quad stretch, sit to stands, stand march, SLS,  SL hip abd & clams; fwd step down; hip hike    Consulted and Agree with Plan of Care  Patient       Patient will benefit from skilled therapeutic intervention in order to improve the following deficits and impairments:  Decreased activity tolerance, Decreased endurance, Decreased range of motion, Abnormal gait, Decreased strength, Hypomobility, Difficulty walking, Increased edema, Impaired flexibility, Increased fascial restricitons, Pain  Visit  Diagnosis: Stiffness of right knee, not elsewhere classified  Difficulty in walking, not elsewhere classified  Acute pain  of right knee     Problem List Patient Active Problem List   Diagnosis Date Noted  . 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 Chronic pain of left knee 09/06/2017  . Unilateral primary osteoarthritis, left knee 09/06/2017  . Chronic pain of right knee 09/06/2017  . Leg length difference, acquired 03/06/2017  . Low back pain 03/06/2017  . Unilateral primary osteoarthritis, right knee 12/06/2016  . 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 Degenerative arthritis of hip 04/19/2012  . Anxiety   . Cancer ([place])   . Depression   . Arthritis   . RENAL CALCULUS, RIGHT 12/15/2008  . ABDOMINAL PAIN, UNSPECIFIED SITE 12/15/2008  . NEPHROLITHIASIS, HX OF 12/15/2008  . ANXIETY DEPRESSION 10/29/2007  . CONTACT DERMATITIS DUE TO SOLAR RADIATION 10/29/2007  . 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

## 2019-03-12 ENCOUNTER — Ambulatory Visit: Payer: Medicare HMO | Admitting: Physical Therapy

## 2019-03-12 ENCOUNTER — Encounter: Payer: Self-pay | Admitting: Physical Therapy

## 2019-03-12 DIAGNOSIS — R1032 Left lower quadrant pain: Secondary | ICD-10-CM | POA: Diagnosis not present

## 2019-03-12 DIAGNOSIS — R262 Difficulty in walking, not elsewhere classified: Secondary | ICD-10-CM

## 2019-03-12 DIAGNOSIS — M25561 Pain in right knee: Secondary | ICD-10-CM | POA: Diagnosis not present

## 2019-03-12 DIAGNOSIS — M25661 Stiffness of right knee, not elsewhere classified: Secondary | ICD-10-CM | POA: Diagnosis not present

## 2019-03-12 NOTE — Therapy (Signed)
[place], [place], [address] Phone: [phone]   Fax:  [phone]  Physical Therapy Treatment  Patient Details  Name: [name] MRN: [id number] Date of Birth: [birth year] Referring Provider (PT): Mcarthur Rossetti, MD   Encounter Date: 03/12/2019  PT End of Session - 03/12/19 1107    Visit Number  20    Number of Visits  28    Date for PT Re-Evaluation  03/28/19    Authorization Type  aetna MCR, progress visit at 63, KX modifier    PT Start Time  1101    PT Stop Time  1144    PT Time Calculation (min)  43 min    Activity Tolerance  Patient tolerated treatment well    Behavior During Therapy  [facility] for tasks assessed/performed       Past Medical History:  Diagnosis Date  . Anxiety   . Arthritis    lower back, hip  . Back pain    generally "causes severe stiffness"  . Complication of anesthesia    "felt it made her feel very goofy for several monthS with general anesthetic"  . Depression   . GERD (gastroesophageal reflux disease)   . History of kidney stones 1988   x1 episode passed on own  . Hypertension   . 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 Osteopenia   . 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 APPENDECTOMY  2010   open  . BREAST LUMPECTOMY Left 1961   benign tumor  . TOTAL HIP ARTHROPLASTY  04/19/2012   Procedure: TOTAL HIP ARTHROPLASTY ANTERIOR APPROACH;  Surgeon: Mcarthur Rossetti, MD;  Location: [facility];  Service: Orthopedics;  Laterality: Left;  Left Total Hip Arthroplasty  . TOTAL HIP ARTHROPLASTY Right 04/21/2016   Procedure: RIGHT TOTAL HIP ARTHROPLASTY ANTERIOR APPROACH;  Surgeon: Mcarthur Rossetti, MD;  Location: [facility];  Service: Orthopedics;  Laterality: Right;  . TOTAL KNEE ARTHROPLASTY Right 12/20/2018   Procedure: RIGHT TOTAL KNEE ARTHROPLASTY;  Surgeon: Mcarthur Rossetti, MD;  [facility];  Service: Orthopedics;  Laterality: Right;    There were no vitals filed for this visit.  Subjective Assessment - 03/12/19 1108    Subjective  "I am still having issues with the flexing but last session he did get me to 120 degrees. I am having some stiffness but not so much pain"    Patient Stated Goals  get my knee more ROM and get back to hiking    Currently in Pain?  No/denies    Pain Location  Knee    Pain Orientation  Right    Pain Type  Surgical pain    Pain Onset  More than a month ago    Pain Frequency  Occasional    Aggravating Factors   flexing the knee    Pain Relieving Factors  exercise, ice         [facility] PT Assessment - 03/12/19 1113      Assessment   Medical Diagnosis  Rt TKA    Referring Provider (PT)  Mcarthur Rossetti, MD      AROM   Right Knee Flexion  115      PROM   Right Knee Flexion  120                   [facility] Adult PT Treatment/Exercise - 03/12/19 0001  Self-Care   Self-Care  Other Self-Care Comments    Other Self-Care Comments   MTPR along the quad using tennis ball combined with DTM / rolling ball      Knee/Hip Exercises: Stretches   Passive Hamstring Stretch  2 reps;30 seconds    Quad Stretch  2 reps;30 seconds;Right    Quad Stretch Limitations  prone with strap    Knee: Self-Stretch Limitations  lunge stretch on step 10 sec X10, kneeling prayer stretch 30 sec X3 on low mat table      Knee/Hip Exercises: Aerobic   Recumbent Bike  L4 x 6 min    lowering seat every 2 min to promote flexion     Knee/Hip Exercises: Standing   Hip Flexion  Both;20 reps;Knee bent    Hip Flexion Limitations  3 lbs    Step Down  Right;15 reps;Step Height: 6";Hand Hold: 2      Knee/Hip Exercises: Seated   Stool Scoot - Round Trips  4 x 30 ft using bil LE      Manual Therapy   Manual therapy comments  MTPR along the quad x 3    Joint Mobilization  Grade III AP with active flexion between sets. Grade III patellar mobs in all planes     Soft tissue mobilization  DTM along quad    Passive ROM  R knee flex and ext in supine, combined with pt actively flexing the knee             PT Education - 03/12/19 1146    Education Details  MTPR techniques and DTM using tennis ball    Person(s) Educated  Patient    Methods  Explanation;Verbal cues    Comprehension  Verbalized understanding;Verbal cues required          PT Long Term Goals - 02/26/19 1205      PT LONG TERM GOAL #1   Title  Pt will be I and compliant with HEP.    Status  Achieved      PT LONG TERM GOAL #2   Title  Pt will improve Rt knee ROM to 0-115 deg to improve functional abilities.    Baseline  see flowsheet    Status  On-going      PT LONG TERM GOAL #3   Title  Pt will improve hip/knee strength to grossly 5/5 MMT tested in sitting.     Baseline  seeflowsheet    Status  On-going      PT LONG TERM GOAL #4   Title  Pt will be able to ambulate communtiy distances and walk on uneven terrain with [facility] gait pattern and return to walking trails/hiking.     Baseline  able to do uphill/downhill on hard surface, has not tried trail    Status  On-going      PT LONG TERM GOAL #5   Title  Pt will have less than 2/10 overall pain with usual activity.    Baseline  no more pain just stiffness    Status  Achieved            Plan - 03/12/19 1147    Clinical Impression Statement  pt reports no pain noting only stiffness. she is progress with ROM increasing AROM to 115 degrees today and continues to have 120 with PROM. She performed exercises well and declined modalities at end of session.    PT Treatment/Interventions  Cryotherapy;Electrical Stimulation;Iontophoresis 4mg /ml Dexamethasone;Moist Heat;Traction;Ultrasound;Gait training;Stair training;Therapeutic activities;Therapeutic exercise;Neuromuscular re-education;Passive range of motion;Dry  needling;Manual techniques;Joint Manipulations;Taping    PT Next Visit Plan  knee ROM, hip and knee strength  and endurance    PT Home Exercise Plan  [id number] , HSS, heel slides, knee flexion lunge stretch, kneeling flexion stretch, quad stretch, sit to stands, stand march, SLS,  SL hip abd & clams; fwd step down; hip hike    Consulted and Agree with Plan of Care  Patient       Patient will benefit from skilled therapeutic intervention in order to improve the following deficits and impairments:  Decreased activity tolerance, Decreased endurance, Decreased range of motion, Abnormal gait, Decreased strength, Hypomobility, Difficulty walking, Increased edema, Impaired flexibility, Increased fascial restricitons, Pain  Visit Diagnosis: Stiffness of right knee, not elsewhere classified  Difficulty in walking, not elsewhere classified  Acute pain of right knee     Problem List Patient Active Problem List   Diagnosis Date Noted  . 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 Chronic pain of left knee 09/06/2017  . Unilateral primary osteoarthritis, left knee 09/06/2017  . Chronic pain of right knee 09/06/2017  . Leg length difference, acquired 03/06/2017  . Low back pain 03/06/2017  . Unilateral primary osteoarthritis, right knee 12/06/2016  . 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 Degenerative arthritis of hip 04/19/2012  . Anxiety   . Cancer ([place])   . Depression   . Arthritis   . RENAL CALCULUS, RIGHT 12/15/2008  . ABDOMINAL PAIN, UNSPECIFIED SITE 12/15/2008  . NEPHROLITHIASIS, HX OF 12/15/2008  . ANXIETY DEPRESSION 10/29/2007  . CONTACT DERMATITIS DUE TO SOLAR RADIATION 10/29/2007  . 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 [place], [place], [address] Phone: [phone]   Fax:  [phone]  Name: [name] MRN: [id number] Date of Birth: 31-Aug-1941

## 2019-03-14 ENCOUNTER — Encounter: Payer: Self-pay | Admitting: Physical Therapy

## 2019-03-14 ENCOUNTER — Other Ambulatory Visit: Payer: Self-pay

## 2019-03-14 ENCOUNTER — Ambulatory Visit: Payer: Medicare HMO | Admitting: Physical Therapy

## 2019-03-14 DIAGNOSIS — R262 Difficulty in walking, not elsewhere classified: Secondary | ICD-10-CM

## 2019-03-14 DIAGNOSIS — M25561 Pain in right knee: Secondary | ICD-10-CM

## 2019-03-14 DIAGNOSIS — M25661 Stiffness of right knee, not elsewhere classified: Secondary | ICD-10-CM | POA: Diagnosis not present

## 2019-03-14 NOTE — Therapy (Signed)
[place], [place], [address] Phone: [phone]   Fax:  [phone]  Physical Therapy Treatment  Patient Details  Name: [name] MRN: [id number] Date of Birth: 12/08/1940 Referring Provider (PT): Mcarthur Rossetti, MD   Encounter Date: 03/14/2019  PT End of Session - 03/14/19 1020    Visit Number  21    Number of Visits  28    Date for PT Re-Evaluation  03/28/19    Authorization Type  aetna MCR, progress visit at 85, KX modifier    PT Start Time  1013    PT Stop Time  1056    PT Time Calculation (min)  43 min    Activity Tolerance  Patient tolerated treatment well    Behavior During Therapy  [facility] for tasks assessed/performed       Past Medical History:  Diagnosis Date  . Anxiety   . Arthritis    lower back, hip  . Back pain    generally "causes severe stiffness"  . Complication of anesthesia    "felt it made her feel very goofy for several monthS with general anesthetic"  . Depression   . GERD (gastroesophageal reflux disease)   . History of kidney stones 1988   x1 episode passed on own  . Hypertension   . 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 Osteopenia   . 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 APPENDECTOMY  2010   open  . BREAST LUMPECTOMY Left 1961   benign tumor  . TOTAL HIP ARTHROPLASTY  04/19/2012   Procedure: TOTAL HIP ARTHROPLASTY ANTERIOR APPROACH;  Surgeon: Mcarthur Rossetti, MD;  Location: [facility];  Service: Orthopedics;  Laterality: Left;  Left Total Hip Arthroplasty  . TOTAL HIP ARTHROPLASTY Right 04/21/2016   Procedure: RIGHT TOTAL HIP ARTHROPLASTY ANTERIOR APPROACH;  Surgeon: Mcarthur Rossetti, MD;  Location: [facility];  Service: Orthopedics;  Laterality: Right;  . TOTAL KNEE ARTHROPLASTY Right 12/20/2018   Procedure: RIGHT TOTAL KNEE ARTHROPLASTY;  Surgeon: Mcarthur Rossetti, MD;  Location: [facility];  Service: Orthopedics;  Laterality: Right;    There were no vitals filed for this visit.  Subjective Assessment - 03/14/19 1015    Subjective  Pt. reports some increased soreness after doing some "different" things after last session otherwise no new complaints/concerns this AM, notes "just stiffness" rather than pain.    Pertinent History  LKG:MWNU Rt THA now with Rt leg longer and wears lift in Lt shoe 04/21/16, anx,backpain,dep,HTN,osteopenia    Patient Stated Goals  get my knee more ROM and get back to hiking    Currently in Pain?  No/denies                       [facility] Adult PT Treatment/Exercise - 03/14/19 0001      Knee/Hip Exercises: Stretches   Passive Hamstring Stretch  Right;2 reps;30 seconds    Quad Stretch  Right;2 reps;30 seconds      Knee/Hip Exercises: Aerobic   Recumbent Bike  L4 x 6 min   lowering seat every 2 min for increased flexion ROM     Knee/Hip Exercises: Standing   Hip Flexion  Both;20 reps;Knee bent    Hip Flexion Limitations  3 lbs., on Airex pad    Hip Abduction  Both;2 sets;10 reps    Abduction Limitations  3 lbs.    Hip  Extension  Both;20 reps    Extension Limitations  3 lbs.    Lateral Step Up  Right;2 sets;10 reps;Hand Hold: 1;Step Height: 6"    Lateral Step Up Limitations  focus eccentric control with lowering    Step Down  Right;15 reps;Hand Hold: 1;Step Height: 6"    Step Down Limitations  focus eccentric control    Functional Squat Limitations  "Touch and go" squat to low table with wedge on table holding 5 lb. KB 2x10    Rocker Board  2 minutes    Rocker Board Limitations  wooden board fw/rev      Manual Therapy   Soft tissue mobilization  Right quadricep    Passive ROM  Right knee flexion and extension with flexion emphasis             PT Education - 03/14/19 1053    Education Details  POC    Person(s) Educated  Patient    Methods  Explanation    Comprehension  Verbalized understanding          PT  Long Term Goals - 02/26/19 1205      PT LONG TERM GOAL #1   Title  Pt will be I and compliant with HEP.    Status  Achieved      PT LONG TERM GOAL #2   Title  Pt will improve Rt knee ROM to 0-115 deg to improve functional abilities.    Baseline  see flowsheet    Status  On-going      PT LONG TERM GOAL #3   Title  Pt will improve hip/knee strength to grossly 5/5 MMT tested in sitting.     Baseline  seeflowsheet    Status  On-going      PT LONG TERM GOAL #4   Title  Pt will be able to ambulate communtiy distances and walk on uneven terrain with [facility] gait pattern and return to walking trails/hiking.     Baseline  able to do uphill/downhill on hard surface, has not tried trail    Status  On-going      PT LONG TERM GOAL #5   Title  Pt will have less than 2/10 overall pain with usual activity.    Baseline  no more pain just stiffness    Status  Achieved            Plan - 03/14/19 1033    Clinical Impression Statement  Right knee ROM continues to improve though still some stiffness. Quad strength also continues to improve but still lacking some eccentric control with stepdown motions,    Rehab Potential  Good    PT Frequency  3x / week    PT Duration  6 weeks    PT Treatment/Interventions  Cryotherapy;Electrical Stimulation;Iontophoresis 4mg /ml Dexamethasone;Moist Heat;Traction;Ultrasound;Gait training;Stair training;Therapeutic activities;Therapeutic exercise;Neuromuscular re-education;Passive range of motion;Dry needling;Manual techniques;Joint Manipulations;Taping    PT Next Visit Plan  knee ROM, hip and knee strength and endurance    PT Home Exercise Plan  [id number] , HSS, heel slides, knee flexion lunge stretch, kneeling flexion stretch, quad stretch, sit to stands, stand march, SLS,  SL hip abd & clams; fwd step down; hip hike    Consulted and Agree with Plan of Care  Patient       Patient will benefit from skilled therapeutic intervention in order to improve the following  deficits and impairments:  Decreased activity tolerance, Decreased endurance, Decreased range of motion, Abnormal gait, Decreased strength, Hypomobility, Difficulty walking, Increased  edema, Impaired flexibility, Increased fascial restricitons, Pain  Visit Diagnosis: Stiffness of right knee, not elsewhere classified  Difficulty in walking, not elsewhere classified  Acute pain of right knee     Problem List Patient Active Problem List   Diagnosis Date Noted  . 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 Chronic pain of left knee 09/06/2017  . Unilateral primary osteoarthritis, left knee 09/06/2017  . Chronic pain of right knee 09/06/2017  . Leg length difference, acquired 03/06/2017  . Low back pain 03/06/2017  . Unilateral primary osteoarthritis, right knee 12/06/2016  . 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 Degenerative arthritis of hip 04/19/2012  . Anxiety   . Cancer ([place])   . Depression   . Arthritis   . RENAL CALCULUS, RIGHT 12/15/2008  . ABDOMINAL PAIN, UNSPECIFIED SITE 12/15/2008  . NEPHROLITHIASIS, HX OF 12/15/2008  . ANXIETY DEPRESSION 10/29/2007  . CONTACT DERMATITIS DUE TO SOLAR RADIATION 10/29/2007  . 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 [place], [place], [address] Phone: [phone]   Fax:  [phone]  Name: [name] MRN: [id number] Date of Birth: September 11, 1941

## 2019-03-17 ENCOUNTER — Ambulatory Visit: Payer: Medicare HMO | Admitting: Physical Therapy

## 2019-03-17 ENCOUNTER — Encounter: Payer: Self-pay | Admitting: Physical Therapy

## 2019-03-17 ENCOUNTER — Other Ambulatory Visit: Payer: Self-pay

## 2019-03-17 DIAGNOSIS — M25561 Pain in right knee: Secondary | ICD-10-CM

## 2019-03-17 DIAGNOSIS — M25661 Stiffness of right knee, not elsewhere classified: Secondary | ICD-10-CM

## 2019-03-17 DIAGNOSIS — R262 Difficulty in walking, not elsewhere classified: Secondary | ICD-10-CM | POA: Diagnosis not present

## 2019-03-17 NOTE — Therapy (Signed)
[place], [place], [address] Phone: [phone]   Fax:  [phone]  Physical Therapy Treatment  Patient Details  Name: [name] MRN: [id number] Date of Birth: May 23, 1941 Referring Provider (PT): Mcarthur Rossetti, MD   Encounter Date: 03/17/2019  PT End of Session - 03/17/19 1017    Visit Number  22    Number of Visits  28    Date for PT Re-Evaluation  03/28/19    Authorization Type  aetna MCR, progress visit at 66, KX modifier    PT Start Time  1017    PT Stop Time  1108    PT Time Calculation (min)  51 min    Activity Tolerance  Patient tolerated treatment well    Behavior During Therapy  [facility] for tasks assessed/performed       Past Medical History:  Diagnosis Date  . Anxiety   . Arthritis    lower back, hip  . Back pain    generally "causes severe stiffness"  . Complication of anesthesia    "felt it made her feel very goofy for several monthS with general anesthetic"  . Depression   . GERD (gastroesophageal reflux disease)   . History of kidney stones 1988   x1 episode passed on own  . Hypertension   . 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 Osteopenia   . 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 APPENDECTOMY  2010   open  . BREAST LUMPECTOMY Left 1961   benign tumor  . TOTAL HIP ARTHROPLASTY  04/19/2012   Procedure: TOTAL HIP ARTHROPLASTY ANTERIOR APPROACH;  Surgeon: Mcarthur Rossetti, MD;  [facility]: [facility];  Service: Orthopedics;  Laterality: Left;  Left Total Hip Arthroplasty  . TOTAL HIP ARTHROPLASTY Right 04/21/2016   Procedure: RIGHT TOTAL HIP ARTHROPLASTY ANTERIOR APPROACH;  Surgeon: Mcarthur Rossetti, MD;  [facility];  Service: Orthopedics;  Laterality: Right;  . TOTAL KNEE ARTHROPLASTY Right 12/20/2018   Procedure: RIGHT TOTAL KNEE ARTHROPLASTY;  Surgeon: Mcarthur Rossetti, MD;  [facility];  Service: Orthopedics;  Laterality: Right;    There were no vitals filed for this visit.  Subjective Assessment - 03/17/19 1019    Subjective  "    Patient Stated Goals  get my knee more ROM and get back to hiking         [facility] PT Assessment - 03/17/19 1022      Assessment   Medical Diagnosis  Rt TKA      AROM   Right Knee Flexion  110   115 following manual     PROM   Right Knee Flexion  118                   [facility] Adult PT Treatment/Exercise - 03/17/19 0001      Knee/Hip Exercises: Stretches   Active Hamstring Stretch  3 reps;30 seconds;Right   PNF contract/ relax   Quad Stretch  Right;2 reps;30 seconds    Gastroc Stretch  Both;2 reps;30 seconds   slant boared     Knee/Hip Exercises: Aerobic   Nustep  L5 x 6 min LE/UE   using UE to assist with knee flexion     Knee/Hip Exercises: Standing   Hip Flexion  Both;20 reps;Knee bent    Hip Flexion Limitations  3 lbs.,     Hip Abduction  Both;2 sets;15 reps  Abduction Limitations  3 lbs.    Hip Extension  1 set;15 reps;Knee straight;Stengthening    Extension Limitations  3 lbs.      Knee/Hip Exercises: Seated   Long Arc Quad  1 set;15 reps   focus on eccentrics    Long Arc Quad Weight  3 lbs.    Heel Slides  1 set;10 reps   holding 5 seocnds , gradually scooting bottom forward   Sit to Sand  10 reps;1 set   with r knee flexion at end of tolerable ROM     Vasopneumatic   Number Minutes Vasopneumatic   10 minutes    Vasopnuematic Location   Knee    Vasopneumatic Pressure  Medium    Vasopneumatic Temperature   34      Manual Therapy   Joint Mobilization  Grade III AP with active flexion between sets. Grade III patellar mobs in all planes    Soft tissue mobilization  IASTM along the right quad tendon during knee mobs    Passive ROM  Right knee flexion and extension with flexion emphasis                  PT Long Term Goals - 02/26/19 1205      PT LONG TERM GOAL #1   Title  Pt  will be I and compliant with HEP.    Status  Achieved      PT LONG TERM GOAL #2   Title  Pt will improve Rt knee ROM to 0-115 deg to improve functional abilities.    Baseline  see flowsheet    Status  On-going      PT LONG TERM GOAL #3   Title  Pt will improve hip/knee strength to grossly 5/5 MMT tested in sitting.     Baseline  seeflowsheet    Status  On-going      PT LONG TERM GOAL #4   Title  Pt will be able to ambulate communtiy distances and walk on uneven terrain with [facility] gait pattern and return to walking trails/hiking.     Baseline  able to do uphill/downhill on hard surface, has not tried trail    Status  On-going      PT LONG TERM GOAL #5   Title  Pt will have less than 2/10 overall pain with usual activity.    Baseline  no more pain just stiffness    Status  Achieved            Plan - 03/17/19 1059    Clinical Impression Statement  R knee mobility contineus to fluctuate from day to today but overall is improving. continued working on strengthening and ROM with empahsis on flexion. utilized Vaso end of session to calm down swelling/ soreness.     PT Next Visit Plan  knee ROM, hip and knee strength and endurance    PT Home Exercise Plan  [id number] , HSS, heel slides, knee flexion lunge stretch, kneeling flexion stretch, quad stretch, sit to stands, stand march, SLS,  SL hip abd & clams; fwd step down; hip hike    Consulted and Agree with Plan of Care  Patient       Patient will benefit from skilled therapeutic intervention in order to improve the following deficits and impairments:     Visit Diagnosis: Stiffness of right knee, not elsewhere classified  Difficulty in walking, not elsewhere classified  Acute pain of right knee     Problem List Patient Active  Problem List   Diagnosis Date Noted  . 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 Chronic pain of left knee 09/06/2017  . Unilateral primary osteoarthritis, left knee 09/06/2017  . Chronic  pain of right knee 09/06/2017  . Leg length difference, acquired 03/06/2017  . Low back pain 03/06/2017  . Unilateral primary osteoarthritis, right knee 12/06/2016  . 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 Degenerative arthritis of hip 04/19/2012  . Anxiety   . Cancer ([place])   . Depression   . Arthritis   . RENAL CALCULUS, RIGHT 12/15/2008  . ABDOMINAL PAIN, UNSPECIFIED SITE 12/15/2008  . NEPHROLITHIASIS, HX OF 12/15/2008  . ANXIETY DEPRESSION 10/29/2007  . CONTACT DERMATITIS DUE TO SOLAR RADIATION 10/29/2007  . 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 [place], [place], [address] Phone: [phone]   Fax:  [phone]  Name: [name] MRN: [id number] Date of Birth: Mar 01, 1941

## 2019-03-19 ENCOUNTER — Ambulatory Visit: Payer: Medicare HMO | Admitting: Physical Therapy

## 2019-03-19 ENCOUNTER — Encounter: Payer: Self-pay | Admitting: Physical Therapy

## 2019-03-19 ENCOUNTER — Other Ambulatory Visit: Payer: Self-pay

## 2019-03-19 DIAGNOSIS — R262 Difficulty in walking, not elsewhere classified: Secondary | ICD-10-CM

## 2019-03-19 DIAGNOSIS — M25661 Stiffness of right knee, not elsewhere classified: Secondary | ICD-10-CM | POA: Diagnosis not present

## 2019-03-19 DIAGNOSIS — M25561 Pain in right knee: Secondary | ICD-10-CM | POA: Diagnosis not present

## 2019-03-19 NOTE — Therapy (Signed)
[place], [place], [address] Phone: [phone]   Fax:  [phone]  Physical Therapy Treatment  Patient Details  Name: [name] MRN: [id number] Date of Birth: 1941/08/22 Referring Provider (PT): Mcarthur Rossetti, MD   Encounter Date: 03/19/2019  PT End of Session - 03/19/19 1015    Visit Number  23    Number of Visits  28    Date for PT Re-Evaluation  03/28/19    Authorization Type  aetna MCR, progress visit at 22, KX modifier    PT Start Time  1016    PT Stop Time  1056    PT Time Calculation (min)  40 min    Activity Tolerance  Patient tolerated treatment well    Behavior During Therapy  [facility] for tasks assessed/performed       Past Medical History:  Diagnosis Date  . Anxiety   . Arthritis    lower back, hip  . Back pain    generally "causes severe stiffness"  . Complication of anesthesia    "felt it made her feel very goofy for several monthS with general anesthetic"  . Depression   . GERD (gastroesophageal reflux disease)   . History of kidney stones 1988   x1 episode passed on own  . Hypertension   . 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 Osteopenia   . 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 APPENDECTOMY  2010   open  . BREAST LUMPECTOMY Left 1961   benign tumor  . TOTAL HIP ARTHROPLASTY  04/19/2012   Procedure: TOTAL HIP ARTHROPLASTY ANTERIOR APPROACH;  Surgeon: Mcarthur Rossetti, MD;  Location: [facility];  Service: Orthopedics;  Laterality: Left;  Left Total Hip Arthroplasty  . TOTAL HIP ARTHROPLASTY Right 04/21/2016   Procedure: RIGHT TOTAL HIP ARTHROPLASTY ANTERIOR APPROACH;  Surgeon: Mcarthur Rossetti, MD;  [facility]: [facility];  Service: Orthopedics;  Laterality: Right;  . TOTAL KNEE ARTHROPLASTY Right 12/20/2018   Procedure: RIGHT TOTAL KNEE ARTHROPLASTY;  Surgeon: Mcarthur Rossetti, MD;  [facility];  Service: Orthopedics;  Laterality: Right;    There were no vitals filed for this visit.  Subjective Assessment - 03/19/19 1016    Subjective  "I am doing pretty good, alittle stiff but today I feel is one of my better days"    Patient Stated Goals  get my knee more ROM and get back to hiking    Currently in Pain?  No/denies    Pain Orientation  Right         [facility] PT Assessment - 03/19/19 0001      AROM   Right Knee Flexion  112   118 following manual                  [facility] Adult PT Treatment/Exercise - 03/19/19 0001      Knee/Hip Exercises: Stretches   Active Hamstring Stretch  2 reps;Right;30 seconds    Quad Stretch  3 reps;30 seconds;Right   PNF contract/ relax     Knee/Hip Exercises: Aerobic   Nustep  L5 x 6 min LE/UE   lowering seat every 2 min to increase flexion     Knee/Hip Exercises: Standing   Hip Flexion  1 set;20 reps   with green band around the feet   Hip Abduction  Stengthening;1 set;2 sets;15 reps;Knee straight   with green theraband   Hip  Extension  2 sets;15 reps;Knee straight   green theraband     Knee/Hip Exercises: Seated   Hamstring Curl  1 set;20 reps    Sit to Sand  1 set;15 reps   with green theraband around the knees     Knee/Hip Exercises: Supine   Bridges  2 sets;10 reps   increasing knee flexion between sets     Manual Therapy   Joint Mobilization  Grade III AP with active flexion between sets. Grade III patellar mobs in all planes    Passive ROM  Right knee flexion and extension with flexion emphasis          Balance Exercises - 03/19/19 1048      Balance Exercises: Standing   Other Standing Exercises  marching in place without HHA with focus of avoiding slamming foot down and control descent, 1 x 15 bil             PT Long Term Goals - 02/26/19 1205      PT LONG TERM GOAL #1   Title  Pt will be I and compliant with HEP.    Status  Achieved      PT LONG TERM GOAL #2   Title  Pt will improve  Rt knee ROM to 0-115 deg to improve functional abilities.    Baseline  see flowsheet    Status  On-going      PT LONG TERM GOAL #3   Title  Pt will improve hip/knee strength to grossly 5/5 MMT tested in sitting.     Baseline  seeflowsheet    Status  On-going      PT LONG TERM GOAL #4   Title  Pt will be able to ambulate communtiy distances and walk on uneven terrain with [facility] gait pattern and return to walking trails/hiking.     Baseline  able to do uphill/downhill on hard surface, has not tried trail    Status  On-going      PT LONG TERM GOAL #5   Title  Pt will have less than 2/10 overall pain with usual activity.    Baseline  no more pain just stiffness    Status  Achieved            Plan - 03/19/19 1044    Clinical Impression Statement  continued working on hip/ knee strengthening and stability with working into endurance training. She is making progress with knee flexion increasing to 118 following manual today. pt reported no pain at end of session and declined modalities.    PT Treatment/Interventions  Cryotherapy;Electrical Stimulation;Iontophoresis 4mg /ml Dexamethasone;Moist Heat;Traction;Ultrasound;Gait training;Stair training;Therapeutic activities;Therapeutic exercise;Neuromuscular re-education;Passive range of motion;Dry needling;Manual techniques;Joint Manipulations;Taping       Patient will benefit from skilled therapeutic intervention in order to improve the following deficits and impairments:  Decreased activity tolerance, Decreased endurance, Decreased range of motion, Abnormal gait, Decreased strength, Hypomobility, Difficulty walking, Increased edema, Impaired flexibility, Increased fascial restricitons, Pain  Visit Diagnosis: Stiffness of right knee, not elsewhere classified  Difficulty in walking, not elsewhere classified  Acute pain of right knee     Problem List Patient Active Problem List   Diagnosis Date Noted  . 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 Chronic pain of left knee 09/06/2017  . Unilateral primary osteoarthritis, left knee 09/06/2017  . Chronic pain of right knee 09/06/2017  . Leg length difference, acquired 03/06/2017  . Low back pain 03/06/2017  . Unilateral primary osteoarthritis, right knee 12/06/2016  .  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 Degenerative arthritis of hip 04/19/2012  . Anxiety   . Cancer ([place])   . Depression   . Arthritis   . RENAL CALCULUS, RIGHT 12/15/2008  . ABDOMINAL PAIN, UNSPECIFIED SITE 12/15/2008  . NEPHROLITHIASIS, HX OF 12/15/2008  . ANXIETY DEPRESSION 10/29/2007  . CONTACT DERMATITIS DUE TO SOLAR RADIATION 10/29/2007  . 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 [place], [place], [address] Phone: [phone]   Fax:  [phone]  Name: Siyah Mault MRN: [id number] Date of Birth: 10-11-1941

## 2019-03-21 ENCOUNTER — Ambulatory Visit: Payer: Medicare HMO | Admitting: Physical Therapy

## 2019-03-21 ENCOUNTER — Other Ambulatory Visit: Payer: Self-pay

## 2019-03-21 DIAGNOSIS — M25661 Stiffness of right knee, not elsewhere classified: Secondary | ICD-10-CM | POA: Diagnosis not present

## 2019-03-21 DIAGNOSIS — M25561 Pain in right knee: Secondary | ICD-10-CM | POA: Diagnosis not present

## 2019-03-21 DIAGNOSIS — R262 Difficulty in walking, not elsewhere classified: Secondary | ICD-10-CM | POA: Diagnosis not present

## 2019-03-21 NOTE — Therapy (Signed)
[place], [place], [address] Phone: [phone]   Fax:  [phone]  Physical Therapy Treatment  Patient Details  Name: [name] MRN: [id number] Date of Birth: 1940/12/12 Referring Provider (PT): Mcarthur Rossetti, MD   Encounter Date: 03/21/2019  PT End of Session - 03/21/19 1015    Visit Number  24    Number of Visits  28    Date for PT Re-Evaluation  03/28/19    Authorization Type  aetna MCR, progress visit at 82, KX modifier    PT Start Time  0900    PT Stop Time  0945    PT Time Calculation (min)  45 min    Activity Tolerance  Patient tolerated treatment well    Behavior During Therapy  [facility] for tasks assessed/performed       Past Medical History:  Diagnosis Date  . Anxiety   . Arthritis    lower back, hip  . Back pain    generally "causes severe stiffness"  . Complication of anesthesia    "felt it made her feel very goofy for several monthS with general anesthetic"  . Depression   . GERD (gastroesophageal reflux disease)   . History of kidney stones 1988   x1 episode passed on own  . Hypertension   . 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 Osteopenia   . 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 APPENDECTOMY  2010   open  . BREAST LUMPECTOMY Left 1961   benign tumor  . TOTAL HIP ARTHROPLASTY  04/19/2012   Procedure: TOTAL HIP ARTHROPLASTY ANTERIOR APPROACH;  Surgeon: Mcarthur Rossetti, MD;  Location: [facility];  Service: Orthopedics;  Laterality: Left;  Left Total Hip Arthroplasty  . TOTAL HIP ARTHROPLASTY Right 04/21/2016   Procedure: RIGHT TOTAL HIP ARTHROPLASTY ANTERIOR APPROACH;  Surgeon: Mcarthur Rossetti, MD;  Location: [facility];  Service: Orthopedics;  Laterality: Right;  . TOTAL KNEE ARTHROPLASTY Right 12/20/2018   Procedure: RIGHT TOTAL KNEE ARTHROPLASTY;  Surgeon: Mcarthur Rossetti, MD;  [facility];  Service: Orthopedics;  Laterality: Right;    There were no vitals filed for this visit.  Subjective Assessment - 03/21/19 1011    Subjective  Knee is doing better todya    Pertinent History  ONG:EXBM Rt THA now with Rt leg longer and wears lift in Lt shoe 04/21/16, anx,backpain,dep,HTN,osteopenia    Currently in Pain?  No/denies                       [facility] Adult PT Treatment/Exercise - 03/21/19 0001      Knee/Hip Exercises: Stretches   Active Hamstring Stretch  2 reps;Right;30 seconds    Quad Stretch  3 reps;30 seconds;Right    Sports administrator Limitations  also with PNF    Other Knee/Hip Stretches  kneeling stretch on mat table for flexion 30 sec  x3    Other Knee/Hip Stretches  standing lunge stretch 10 sec X 10 for flexion      Knee/Hip Exercises: Aerobic   Nustep  L5 x 6 min LE/UE      Knee/Hip Exercises: Standing   Hip Flexion  1 set;20 reps    Hip Flexion Limitations  green    Hip Abduction  Stengthening;1 set;2 sets;15 reps;Knee straight    Abduction Limitations  green    Hip Extension  2 sets;15 reps;Knee straight  Extension Limitations  green    Step Down  Right;Hand Hold: 1;2 sets;10 reps;Step Height: 8"    SLS  Rt leg 3X30 sec, last rep on blue foam pad    Other Standing Knee Exercises  sidestepping with green band around knees with mini squat at counter up/down X 3      Manual Therapy   Joint Mobilization  Grade III AP with active flexion between sets. Grade III patellar mobs in all planes    Passive ROM  Right knee flexion and extension with flexion emphasis                  PT Long Term Goals - 02/26/19 1205      PT LONG TERM GOAL #1   Title  Pt will be I and compliant with HEP.    Status  Achieved      PT LONG TERM GOAL #2   Title  Pt will improve Rt knee ROM to 0-115 deg to improve functional abilities.    Baseline  see flowsheet    Status  On-going      PT LONG TERM GOAL #3   Title  Pt will improve hip/knee strength to  grossly 5/5 MMT tested in sitting.     Baseline  seeflowsheet    Status  On-going      PT LONG TERM GOAL #4   Title  Pt will be able to ambulate communtiy distances and walk on uneven terrain with [facility] gait pattern and return to walking trails/hiking.     Baseline  able to do uphill/downhill on hard surface, has not tried trail    Status  On-going      PT LONG TERM GOAL #5   Title  Pt will have less than 2/10 overall pain with usual activity.    Baseline  no more pain just stiffness    Status  Achieved            Plan - 03/21/19 1015    Clinical Impression Statement  She showed improved ROM after stretching and MT but her knee was stiff upon arival only 115 deg and after session was 121 deg with PROM. Hip and knee strength continued with good tolerance. She also showed improvments with SLS stance today.     PT Treatment/Interventions  Cryotherapy;Electrical Stimulation;Iontophoresis 4mg /ml Dexamethasone;Moist Heat;Traction;Ultrasound;Gait training;Stair training;Therapeutic activities;Therapeutic exercise;Neuromuscular re-education;Passive range of motion;Dry needling;Manual techniques;Joint Manipulations;Taping    PT Next Visit Plan  knee ROM, hip and knee strength and endurance    PT Home Exercise Plan  [id number] , HSS, heel slides, knee flexion lunge stretch, kneeling flexion stretch, quad stretch, sit to stands, stand march, SLS,  SL hip abd & clams; fwd step down; hip hike    Consulted and Agree with Plan of Care  Patient       Patient will benefit from skilled therapeutic intervention in order to improve the following deficits and impairments:  Decreased activity tolerance, Decreased endurance, Decreased range of motion, Abnormal gait, Decreased strength, Hypomobility, Difficulty walking, Increased edema, Impaired flexibility, Increased fascial restricitons, Pain  Visit Diagnosis: Stiffness of right knee, not elsewhere classified  Difficulty in walking, not elsewhere  classified  Acute pain of right knee     Problem List Patient Active Problem List   Diagnosis Date Noted  . 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 Chronic pain of left knee 09/06/2017  . Unilateral primary osteoarthritis, left knee 09/06/2017  . Chronic pain of right knee 09/06/2017  .  Leg length difference, acquired 03/06/2017  . Low back pain 03/06/2017  . Unilateral primary osteoarthritis, right knee 12/06/2016  . 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 Degenerative arthritis of hip 04/19/2012  . Anxiety   . Cancer ([place])   . Depression   . Arthritis   . RENAL CALCULUS, RIGHT 12/15/2008  . ABDOMINAL PAIN, UNSPECIFIED SITE 12/15/2008  . NEPHROLITHIASIS, HX OF 12/15/2008  . ANXIETY DEPRESSION 10/29/2007  . CONTACT DERMATITIS DUE TO SOLAR RADIATION 10/29/2007  . 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, [place], [address] Phone: [phone]   Fax:  [phone]  Name: [name] MRN: [id number] Date of Birth: 09-03-1941

## 2019-03-26 ENCOUNTER — Other Ambulatory Visit: Payer: Self-pay

## 2019-03-26 ENCOUNTER — Encounter: Payer: Self-pay | Admitting: Physical Therapy

## 2019-03-26 ENCOUNTER — Ambulatory Visit: Payer: Medicare HMO | Admitting: Physical Therapy

## 2019-03-26 DIAGNOSIS — M25661 Stiffness of right knee, not elsewhere classified: Secondary | ICD-10-CM

## 2019-03-26 DIAGNOSIS — M25561 Pain in right knee: Secondary | ICD-10-CM

## 2019-03-26 DIAGNOSIS — R262 Difficulty in walking, not elsewhere classified: Secondary | ICD-10-CM | POA: Diagnosis not present

## 2019-03-26 NOTE — Therapy (Addendum)
[place], [place], [address] Phone: [phone]   Fax:  [phone]  Physical Therapy Treatment Progress Note Reporting Period 02/26/2019 to 03/28/2019  See note below for Objective Data and Assessment of Progress/Goals.       Patient Details  Name: [name] MRN: [id number] Date of Birth: Jun 04, 1941 Referring Provider (PT): Mcarthur Rossetti, MD   Encounter Date: 03/26/2019  PT End of Session - 03/26/19 1149    Visit Number  25    Number of Visits  28    Date for PT Re-Evaluation  03/28/19    Authorization Type  aetna MCR, progress visit at 79, KX modifier    PT Start Time  1148    PT Stop Time  1226    PT Time Calculation (min)  38 min       Past Medical History:  Diagnosis Date  . Anxiety   . Arthritis    lower back, hip  . Back pain    generally "causes severe stiffness"  . Complication of anesthesia    "felt it made her feel very goofy for several monthS with general anesthetic"  . Depression   . GERD (gastroesophageal reflux disease)   . History of kidney stones 1988   x1 episode passed on own  . Hypertension   . 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 Osteopenia   . 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 APPENDECTOMY  2010   open  . BREAST LUMPECTOMY Left 1961   benign tumor  . TOTAL HIP ARTHROPLASTY  04/19/2012   Procedure: TOTAL HIP ARTHROPLASTY ANTERIOR APPROACH;  Surgeon: Mcarthur Rossetti, MD;  [facility];  Service: Orthopedics;  Laterality: Left;  Left Total Hip Arthroplasty  . TOTAL HIP ARTHROPLASTY Right 04/21/2016   Procedure: RIGHT TOTAL HIP ARTHROPLASTY ANTERIOR APPROACH;  Surgeon: Mcarthur Rossetti, MD;  [facility];  Service: Orthopedics;  Laterality: Right;  . TOTAL KNEE ARTHROPLASTY Right 12/20/2018   Procedure: RIGHT TOTAL KNEE ARTHROPLASTY;  Surgeon: Mcarthur Rossetti,  MD;  [facility];  Service: Orthopedics;  Laterality: Right;    There were no vitals filed for this visit.  Subjective Assessment - 03/26/19 1150    Subjective  " I have been doing the exercises, I just have had some increased soreness in the knee"    Currently in Pain?  Yes    Pain Score  0-No pain    Aggravating Factors   deep bending of the knee"          [facility] PT Assessment - 03/26/19 0001      AROM   Right Knee Flexion  115                   [facility] Adult PT Treatment/Exercise - 03/26/19 0001      Knee/Hip Exercises: Aerobic   Recumbent Bike  L4 x 5 min   lowering seat at 2:30 sec     Knee/Hip Exercises: Standing   Hip Abduction  --    Abduction Limitations  --    Hip Extension  --    Extension Limitations  --    Step Down  Right;Hand Hold: 1;2 sets;10 reps;Step Height: 8"    Other Standing Knee Exercises  kettlebell dead lift 2 x 10 10# ( touching down onto 8 inch step      Knee/Hip Exercises: Seated  Long [employer]  2 sets;15 reps;Right;Strengthening   seated on dynadisc   Long [employer]  --   4   Marching  2 sets;20 reps   4# while seated on dyna disc   Hamstring Curl  2 sets;15 reps   with green band while seated on dynadisc     Manual Therapy   Manual Therapy  Other (comment)    Manual therapy comments  MTPR along the R biceps femoris x 2    Joint Mobilization  Grade III AP with active flexion between sets. Grade III patellar mobs in all planes    Other Manual Therapy  Tack and stretch of the hamstring                  PT Long Term Goals - 02/26/19 1205      PT LONG TERM GOAL #1   Title  Pt will be I and compliant with HEP.    Status  Achieved      PT LONG TERM GOAL #2   Title  Pt will improve Rt knee ROM to 0-115 deg to improve functional abilities.    Baseline  see flowsheet    Status  On-going      PT LONG TERM GOAL #3   Title  Pt will improve hip/knee strength to grossly 5/5 MMT tested in sitting.      Baseline  seeflowsheet    Status  On-going      PT LONG TERM GOAL #4   Title  Pt will be able to ambulate communtiy distances and walk on uneven terrain with [facility] gait pattern and return to walking trails/hiking.     Baseline  able to do uphill/downhill on hard surface, has not tried trail    Status  On-going      PT LONG TERM GOAL #5   Title  Pt will have less than 2/10 overall pain with usual activity.    Baseline  no more pain just stiffness    Status  Achieved            Plan - 03/26/19 1226    Clinical Impression Statement  [name] continues to improve with ROM demonstrating 115 degrees today. continued working hip/ knee strengthening incorporating core activation. pt noted improvement of hip strength with ease of hip flexion. no pain noted at end of sesison and pt declined modalities.    PT Treatment/Interventions  Cryotherapy;Electrical Stimulation;Iontophoresis 4mg /ml Dexamethasone;Moist Heat;Traction;Ultrasound;Gait training;Stair training;Therapeutic activities;Therapeutic exercise;Neuromuscular re-education;Passive range of motion;Dry needling;Manual techniques;Joint Manipulations;Taping    PT Next Visit Plan  knee ROM, hip and knee strength and endurance    PT Home Exercise Plan  [id number] , HSS, heel slides, knee flexion lunge stretch, kneeling flexion stretch, quad stretch, sit to stands, stand march, SLS,  SL hip abd & clams; fwd step down; hip hike    Consulted and Agree with Plan of Care  Patient       Patient will benefit from skilled therapeutic intervention in order to improve the following deficits and impairments:  Decreased activity tolerance, Decreased endurance, Decreased range of motion, Abnormal gait, Decreased strength, Hypomobility, Difficulty walking, Increased edema, Impaired flexibility, Increased fascial restricitons, Pain  Visit Diagnosis: Difficulty in walking, not elsewhere classified  Acute pain of right knee  Stiffness of right knee, not elsewhere  classified     Problem List Patient Active Problem List   Diagnosis Date Noted  . 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 Chronic pain of  left knee 09/06/2017  . Unilateral primary osteoarthritis, left knee 09/06/2017  . Chronic pain of right knee 09/06/2017  . Leg length difference, acquired 03/06/2017  . Low back pain 03/06/2017  . Unilateral primary osteoarthritis, right knee 12/06/2016  . 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 Degenerative arthritis of hip 04/19/2012  . Anxiety   . Cancer ([place])   . Depression   . Arthritis   . RENAL CALCULUS, RIGHT 12/15/2008  . ABDOMINAL PAIN, UNSPECIFIED SITE 12/15/2008  . NEPHROLITHIASIS, HX OF 12/15/2008  . ANXIETY DEPRESSION 10/29/2007  . CONTACT DERMATITIS DUE TO SOLAR RADIATION 10/29/2007  . 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 [place], [place], [address] Phone: [phone]   Fax:  [phone]  Name: [name] MRN: [id number] Date of Birth: 11/01/1940

## 2019-03-27 ENCOUNTER — Ambulatory Visit: Payer: PRIVATE HEALTH INSURANCE | Admitting: Orthopaedic Surgery

## 2019-03-28 ENCOUNTER — Other Ambulatory Visit: Payer: Self-pay

## 2019-03-28 ENCOUNTER — Encounter: Payer: Self-pay | Admitting: Physical Therapy

## 2019-03-28 ENCOUNTER — Ambulatory Visit: Payer: Medicare HMO | Admitting: Physical Therapy

## 2019-03-28 DIAGNOSIS — R262 Difficulty in walking, not elsewhere classified: Secondary | ICD-10-CM

## 2019-03-28 DIAGNOSIS — M25561 Pain in right knee: Secondary | ICD-10-CM

## 2019-03-28 DIAGNOSIS — M25661 Stiffness of right knee, not elsewhere classified: Secondary | ICD-10-CM | POA: Diagnosis not present

## 2019-03-28 NOTE — Therapy (Signed)
[place], [place], [address] Phone: [phone]   Fax:  [phone]  Physical Therapy Treatment  Patient Details  Name: [name] MRN: [id number] Date of Birth: [birth year] Referring Provider (PT): Mcarthur Rossetti, MD   Encounter Date: 03/28/2019  PT End of Session - 03/28/19 0933    Visit Number  26    Number of Visits  28    Date for PT Re-Evaluation  03/28/19    Authorization Type  aetna MCR, progress visit at 33, KX modifier    PT Start Time  0933    PT Stop Time  1014    PT Time Calculation (min)  41 min    Activity Tolerance  Patient tolerated treatment well    Behavior During Therapy  [facility] for tasks assessed/performed       Past Medical History:  Diagnosis Date  . Anxiety   . Arthritis    lower back, hip  . Back pain    generally "causes severe stiffness"  . Complication of anesthesia    "felt it made her feel very goofy for several monthS with general anesthetic"  . Depression   . GERD (gastroesophageal reflux disease)   . History of kidney stones 1988   x1 episode passed on own  . Hypertension   . 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 Osteopenia   . 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 APPENDECTOMY  2010   open  . BREAST LUMPECTOMY Left 1961   benign tumor  . TOTAL HIP ARTHROPLASTY  04/19/2012   Procedure: TOTAL HIP ARTHROPLASTY ANTERIOR APPROACH;  Surgeon: Mcarthur Rossetti, MD;  [facility];  Service: Orthopedics;  Laterality: Left;  Left Total Hip Arthroplasty  . TOTAL HIP ARTHROPLASTY Right 04/21/2016   Procedure: RIGHT TOTAL HIP ARTHROPLASTY ANTERIOR APPROACH;  Surgeon: Mcarthur Rossetti, MD;  [facility];  Service: Orthopedics;  Laterality: Right;  . TOTAL KNEE ARTHROPLASTY Right 12/20/2018   Procedure: RIGHT TOTAL KNEE ARTHROPLASTY;  Surgeon: Mcarthur Rossetti, MD;  [facility];  Service: Orthopedics;  Laterality: Right;    There were no vitals filed for this visit.  Subjective Assessment - 03/28/19 0937    Subjective  "no pain, just  mostly stiffness in the knee with bending"    Patient Stated Goals  get my knee more ROM and get back to hiking    Currently in Pain?  No/denies         [facility] PT Assessment - 03/28/19 0001      Assessment   Medical Diagnosis  Rt TKA    Referring Provider (PT)  Mcarthur Rossetti, MD      AROM   Right Knee Flexion  114   119 in sitting with legs hanging off edge of table     PROM   Right Knee Flexion  125   in sitting with knee hanging off the table                  [facility] Adult PT Treatment/Exercise - 03/28/19 0001      Knee/Hip Exercises: Stretches   Active Hamstring Stretch  2 reps;30 seconds    Quad Stretch  4 reps;30 seconds    Other Knee/Hip Stretches  kneeling stretch on mat table for flexion 30 sec  x3      Knee/Hip Exercises: Aerobic   Recumbent Bike  L2  x 6 min    lowering seat every 2 min to promote flexion     Knee/Hip Exercises: Seated   Hamstring Curl  Strengthening;Right;1 set;10 reps   ecce   Sit to Sand  1 set   12 reps with blue theraband around the knees     Knee/Hip Exercises: Supine   Bridges  1 set;10 reps      Manual Therapy   Joint Mobilization  grade 3 distraction combined with PROM flexion of the RLE   seated on edge of table with elevated   Other Manual Therapy  MTPR along the biceps femoris while on stretch                  PT Long Term Goals - 02/26/19 1205      PT LONG TERM GOAL #1   Title  Pt will be I and compliant with HEP.    Status  Achieved      PT LONG TERM GOAL #2   Title  Pt will improve Rt knee ROM to 0-115 deg to improve functional abilities.    Baseline  see flowsheet    Status  On-going      PT LONG TERM GOAL #3   Title  Pt will improve hip/knee strength to grossly 5/5 MMT tested in sitting.     Baseline  seeflowsheet     Status  On-going      PT LONG TERM GOAL #4   Title  Pt will be able to ambulate communtiy distances and walk on uneven terrain with [facility] gait pattern and return to walking trails/hiking.     Baseline  able to do uphill/downhill on hard surface, has not tried trail    Status  On-going      PT LONG TERM GOAL #5   Title  Pt will have less than 2/10 overall pain with usual activity.    Baseline  no more pain just stiffness    Status  Achieved            Plan - 03/28/19 1013    Clinical Impression Statement  pt continues to report no pain today mostly stiffness. focused alot on ROM/ stretching today. she initally measured 114 degrees in supine but progress to 119 following mobs/ PROM which was reassessed in sitting. continued working on hip [name] strength     PT Treatment/Interventions  Cryotherapy;Electrical Stimulation;Iontophoresis 4mg /ml Dexamethasone;Moist Heat;Traction;Ultrasound;Gait training;Stair training;Therapeutic activities;Therapeutic exercise;Neuromuscular re-education;Passive range of motion;Dry needling;Manual techniques;Joint Manipulations;Taping    PT Next Visit Plan  ERO, knee ROM, hip and knee strength and endurance    PT Home Exercise Plan  [id number] , HSS, heel slides, knee flexion lunge stretch, kneeling flexion stretch, quad stretch, sit to stands, stand march, SLS,  SL hip abd & clams; fwd step down; hip hike    Consulted and Agree with Plan of Care  Patient       Patient will benefit from skilled therapeutic intervention in order to improve the following deficits and impairments:  Decreased activity tolerance, Decreased endurance, Decreased range of motion, Abnormal gait, Decreased strength, Hypomobility, Difficulty walking, Increased edema, Impaired flexibility, Increased fascial restricitons, Pain  Visit Diagnosis: Difficulty in walking, not elsewhere classified  Acute pain of right knee  Stiffness of right knee, not elsewhere classified     Problem  List Patient Active Problem List   Diagnosis Date Noted  . 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 Chronic pain of left knee 09/06/2017  . Unilateral  primary osteoarthritis, left knee 09/06/2017  . Chronic pain of right knee 09/06/2017  . Leg length difference, acquired 03/06/2017  . Low back pain 03/06/2017  . Unilateral primary osteoarthritis, right knee 12/06/2016  . 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 Degenerative arthritis of hip 04/19/2012  . Anxiety   . Cancer ([place])   . Depression   . Arthritis   . RENAL CALCULUS, RIGHT 12/15/2008  . ABDOMINAL PAIN, UNSPECIFIED SITE 12/15/2008  . NEPHROLITHIASIS, HX OF 12/15/2008  . ANXIETY DEPRESSION 10/29/2007  . CONTACT DERMATITIS DUE TO SOLAR RADIATION 10/29/2007  . 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

## 2019-03-31 ENCOUNTER — Ambulatory Visit: Payer: Medicare HMO | Attending: Orthopaedic Surgery | Admitting: Physical Therapy

## 2019-03-31 ENCOUNTER — Other Ambulatory Visit: Payer: Self-pay

## 2019-03-31 DIAGNOSIS — M25661 Stiffness of right knee, not elsewhere classified: Secondary | ICD-10-CM

## 2019-03-31 DIAGNOSIS — M25561 Pain in right knee: Secondary | ICD-10-CM | POA: Insufficient documentation

## 2019-03-31 DIAGNOSIS — R262 Difficulty in walking, not elsewhere classified: Secondary | ICD-10-CM | POA: Diagnosis not present

## 2019-03-31 NOTE — Therapy (Signed)
[place], [place], [address] Phone: [phone]   Fax:  [phone]  Physical Therapy Treatment  Patient Details  Name: [name] MRN: [id number] Date of Birth: Feb 02, 1941 Referring Provider (PT): Mcarthur Rossetti, MD   Encounter Date: 03/31/2019  PT End of Session - 03/31/19 0915    Visit Number  27    Number of Visits  28    Date for PT Re-Evaluation  03/28/19    Authorization Type  aetna MCR, progress visit at 102, KX modifier    PT Start Time  0830    PT Stop Time  0910    PT Time Calculation (min)  40 min    Activity Tolerance  Patient tolerated treatment well    Behavior During Therapy  [facility] for tasks assessed/performed       Past Medical History:  Diagnosis Date  . Anxiety   . Arthritis    lower back, hip  . Back pain    generally "causes severe stiffness"  . Complication of anesthesia    "felt it made her feel very goofy for several monthS with general anesthetic"  . Depression   . GERD (gastroesophageal reflux disease)   . History of kidney stones 1988   x1 episode passed on own  . Hypertension   . 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 Osteopenia   . 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 APPENDECTOMY  2010   open  . BREAST LUMPECTOMY Left 1961   benign tumor  . TOTAL HIP ARTHROPLASTY  04/19/2012   Procedure: TOTAL HIP ARTHROPLASTY ANTERIOR APPROACH;  Surgeon: Mcarthur Rossetti, MD;  Location: [facility];  Service: Orthopedics;  Laterality: Left;  Left Total Hip Arthroplasty  . TOTAL HIP ARTHROPLASTY Right 04/21/2016   Procedure: RIGHT TOTAL HIP ARTHROPLASTY ANTERIOR APPROACH;  Surgeon: Mcarthur Rossetti, MD;  Location: [facility];  Service: Orthopedics;  Laterality: Right;  . TOTAL KNEE ARTHROPLASTY Right 12/20/2018   Procedure: RIGHT TOTAL KNEE ARTHROPLASTY;  Surgeon: Mcarthur Rossetti, MD;  Location: [facility];  Service: Orthopedics;  Laterality: Right;    There were no vitals filed for this visit.  Subjective Assessment - 03/31/19 0839    Subjective  Relays she got a great week of PT last week, no pain today just stifness    Pertinent History  NIO:EVOJ Rt THA now with Rt leg longer and wears lift in Lt shoe 04/21/16, anx,backpain,dep,HTN,osteopenia    Currently in Pain?  No/denies    Pain Orientation  Right    Pain Descriptors / Indicators  Tightness         [facility] PT Assessment - 03/31/19 0001      Assessment   Medical Diagnosis  Rt TKA    Referring Provider (PT)  Mcarthur Rossetti, MD      PROM   Right Knee Flexion  125                   [facility] Adult PT Treatment/Exercise - 03/31/19 0001      Knee/Hip Exercises: Stretches   Active Hamstring Stretch  2 reps;30 seconds    Active Hamstring Stretch Limitations  sitting    Quad Stretch  30 seconds    Quad Stretch Limitations  3 reps prone with strap    Other Knee/Hip Stretches  kneeling stretch on mat table for flexion 30 sec  x3  Knee/Hip Exercises: Aerobic   Nustep  L6 5 min UE/LE      Knee/Hip Exercises: Standing   Other Standing Knee Exercises  marches, hip abd, hip ext, hamstring curls all on foam surface to challenge balance and all with 3 lbs bilat X 15 reps ea    Other Standing Knee Exercises  step ups onto 2nd step using bilat handrails X 10, then step downs from first step 15 reps perfomed at back staircase      Knee/Hip Exercises: Seated   Sit to Sand  10 reps;without UE support      Manual Therapy   Joint Mobilization  grade 3 distraction combined with PROM flexion of the RLE                  PT Long Term Goals - 02/26/19 1205      PT LONG TERM GOAL #1   Title  Pt will be I and compliant with HEP.    Status  Achieved      PT LONG TERM GOAL #2   Title  Pt will improve Rt knee ROM to 0-115 deg to improve functional abilities.    Baseline  see flowsheet    Status  On-going       PT LONG TERM GOAL #3   Title  Pt will improve hip/knee strength to grossly 5/5 MMT tested in sitting.     Baseline  seeflowsheet    Status  On-going      PT LONG TERM GOAL #4   Title  Pt will be able to ambulate communtiy distances and walk on uneven terrain with [facility] gait pattern and return to walking trails/hiking.     Baseline  able to do uphill/downhill on hard surface, has not tried trail    Status  On-going      PT LONG TERM GOAL #5   Title  Pt will have less than 2/10 overall pain with usual activity.    Baseline  no more pain just stiffness    Status  Achieved            Plan - 03/31/19 0915    Clinical Impression Statement  Pt is doing good overall just continues to have some stiffness, ROM continues to slowly improve. She will likely be ready for discharge next visit but PT will wait until she sees MD that day about his recommendation to continue PT or not    PT Treatment/Interventions  Cryotherapy;Electrical Stimulation;Iontophoresis 4mg /ml Dexamethasone;Moist Heat;Traction;Ultrasound;Gait training;Stair training;Therapeutic activities;Therapeutic exercise;Neuromuscular re-education;Passive range of motion;Dry needling;Manual techniques;Joint Manipulations;Taping    PT Next Visit Plan  ERO, knee ROM, hip and knee strength and endurance    PT Home Exercise Plan  [id number] , HSS, heel slides, knee flexion lunge stretch, kneeling flexion stretch, quad stretch, sit to stands, stand march, SLS,  SL hip abd & clams; fwd step down; hip hike    Consulted and Agree with Plan of Care  Patient       Patient will benefit from skilled therapeutic intervention in order to improve the following deficits and impairments:  Decreased activity tolerance, Decreased endurance, Decreased range of motion, Abnormal gait, Decreased strength, Hypomobility, Difficulty walking, Increased edema, Impaired flexibility, Increased fascial restricitons, Pain  Visit Diagnosis: Difficulty in walking,  not elsewhere classified  Acute pain of right knee  Stiffness of right knee, not elsewhere classified     Problem List Patient Active Problem List   Diagnosis Date Noted  . 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 Chronic pain of left knee 09/06/2017  . Unilateral primary osteoarthritis, left knee 09/06/2017  . Chronic pain of right knee 09/06/2017  . Leg length difference, acquired 03/06/2017  . Low back pain 03/06/2017  . Unilateral primary osteoarthritis, right knee 12/06/2016  . 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 Degenerative arthritis of hip 04/19/2012  . Anxiety   . Cancer ([place])   . Depression   . Arthritis   . RENAL CALCULUS, RIGHT 12/15/2008  . ABDOMINAL PAIN, UNSPECIFIED SITE 12/15/2008  . NEPHROLITHIASIS, HX OF 12/15/2008  . ANXIETY DEPRESSION 10/29/2007  . CONTACT DERMATITIS DUE TO SOLAR RADIATION 10/29/2007  . OSTEOARTHROSIS, LOCAL, PRIMARY, [facility] SITE 08/16/2007    Silvestre Mesi 03/31/2019, 9:17 AM  [facility] [address] [place], [place], [address] Phone: [phone]   Fax:  [phone]  Name: [name] MRN: [id number] Date of Birth: 1941/06/24

## 2019-04-02 ENCOUNTER — Ambulatory Visit: Payer: Medicare HMO | Admitting: Physical Therapy

## 2019-04-02 ENCOUNTER — Encounter: Payer: Self-pay | Admitting: Physician Assistant

## 2019-04-02 ENCOUNTER — Other Ambulatory Visit: Payer: Self-pay

## 2019-04-02 ENCOUNTER — Ambulatory Visit (INDEPENDENT_AMBULATORY_CARE_PROVIDER_SITE_OTHER): Payer: Medicare HMO | Admitting: Physician Assistant

## 2019-04-02 DIAGNOSIS — M2170 Unequal limb length (acquired), unspecified site: Secondary | ICD-10-CM | POA: Diagnosis not present

## 2019-04-02 DIAGNOSIS — M25551 Pain in right hip: Secondary | ICD-10-CM | POA: Diagnosis not present

## 2019-04-02 DIAGNOSIS — M25661 Stiffness of right knee, not elsewhere classified: Secondary | ICD-10-CM

## 2019-04-02 DIAGNOSIS — Z96651 Presence of right artificial knee joint: Secondary | ICD-10-CM

## 2019-04-02 DIAGNOSIS — R262 Difficulty in walking, not elsewhere classified: Secondary | ICD-10-CM

## 2019-04-02 DIAGNOSIS — M25561 Pain in right knee: Secondary | ICD-10-CM

## 2019-04-02 NOTE — Progress Notes (Signed)
HPI: [name] returns today for follow-up of her right total knee arthroplasty.  She is now 103 days postop.  Knee overall is improving.  Still she states that her knee is "cranky".  She might continue to therapy to work on range of motion strengthening the knee.  She also is having some pain down the lateral aspect of her right hip which therapy is working on.  Physical exam: Right knee full extension flexion to approximately 110 to 115 degrees.  No instability valgus varus stressing.  Calf supple nontender.  Tenderness over the right trochanteric region and down the IT band.  Impression: Status post right total knee arthroplasty 12/20/2018 Right hip trochanteric bursitis  Plan: We will send her back to physical therapy to work on range of motion, strengthening of her right knee and also IT band stretching.  See her back in 3 months to check her progress.  Questions were encouraged and answered at length.  She did ask about getting a new prescription for leg length discrepancy (left shoe was given to her and she will fill this through [employer].

## 2019-04-02 NOTE — Therapy (Signed)
[place], [place], [address] Phone: [phone]   Fax:  [phone]  Physical Therapy Treatment and MD Progress note Patient Details  Name: [name] MRN: [id number] Date of Birth: 19-Dec-1940 Referring Provider (PT): Mcarthur Rossetti, MD   Encounter Date: 04/02/2019  PT End of Session - 04/02/19 0837    Visit Number  28    Number of Visits  74   recert today   Date for PT Re-Evaluation  04/30/19    Authorization Type  aetna MCR, progress visit at 36, KX modifier    PT Start Time  0830    PT Stop Time  0915    PT Time Calculation (min)  45 min    Activity Tolerance  Patient tolerated treatment well    Behavior During Therapy  [facility] for tasks assessed/performed       Past Medical History:  Diagnosis Date  . Anxiety   . Arthritis    lower back, hip  . Back pain    generally "causes severe stiffness"  . Complication of anesthesia    "felt it made her feel very goofy for several monthS with general anesthetic"  . Depression   . GERD (gastroesophageal reflux disease)   . History of kidney stones 1988   x1 episode passed on own  . Hypertension   . 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 Osteopenia   . 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 APPENDECTOMY  2010   open  . BREAST LUMPECTOMY Left 1961   benign tumor  . TOTAL HIP ARTHROPLASTY  04/19/2012   Procedure: TOTAL HIP ARTHROPLASTY ANTERIOR APPROACH;  Surgeon: Mcarthur Rossetti, MD;  Location: [facility];  Service: Orthopedics;  Laterality: Left;  Left Total Hip Arthroplasty  . TOTAL HIP ARTHROPLASTY Right 04/21/2016   Procedure: RIGHT TOTAL HIP ARTHROPLASTY ANTERIOR APPROACH;  Surgeon: Mcarthur Rossetti, MD;  Location: [facility];  Service: Orthopedics;  Laterality: Right;  . TOTAL KNEE ARTHROPLASTY Right 12/20/2018   Procedure: RIGHT TOTAL KNEE ARTHROPLASTY;  Surgeon: Mcarthur Rossetti, MD;  Location: [facility];  Service: Orthopedics;  Laterality: Right;    There were no vitals filed for this visit.  Subjective Assessment - 04/02/19 0836    Subjective  Pt relays no pain but she continues to have stiffness    Currently in Pain?  No/denies         [facility] PT Assessment - 04/02/19 0001      Assessment   Medical Diagnosis  Rt TKA    Referring Provider (PT)  Mcarthur Rossetti, MD      AROM   Right Knee Flexion  114      PROM   Right Knee Flexion  123      Strength   Overall Strength Comments  Rt knee 5/5 MMT, bilat hip 4+/5 grossly                   [facility] Adult PT Treatment/Exercise - 04/02/19 0001      Knee/Hip Exercises: Stretches   Active Hamstring Stretch  Right;3 reps;30 seconds    Active Hamstring Stretch Limitations  sitting    Quad Stretch  30 seconds    Quad Stretch Limitations  3 reps supine let off table with strap    Other Knee/Hip Stretches  kneeling stretch on mat table for flexion 30 sec  x3  Knee/Hip Exercises: Aerobic   Recumbent Bike  L2 x 6 min       Knee/Hip Exercises: Standing   Other Standing Knee Exercises  marches, hip abd, hip ext, hamstring curls all on foam surface to challenge balance and all with 3 lbs bilat X 15 reps ea    Other Standing Knee Exercises  up/down stairs with no handrails X 3      Manual Therapy   Joint Mobilization  grade 3 distraction combined with PROM flexion of the RLE                  PT Long Term Goals - 04/02/19 0924      PT LONG TERM GOAL #1   Title  Pt will be I and compliant with HEP.    Status  Achieved      PT LONG TERM GOAL #2   Title  Pt will improve Rt knee ROM to 0-115 deg to improve functional abilities.    Baseline  met PROM not for AROM    Status  Partially Met      PT LONG TERM GOAL #3   Title  Pt will improve hip/knee strength to grossly 5/5 MMT tested in sitting.     Status  Partially Met      PT LONG TERM GOAL #4   Title  Pt will  be able to ambulate communtiy distances and walk on uneven terrain with [facility] gait pattern and return to walking trails/hiking.     Status  Achieved      PT LONG TERM GOAL #5   Title  Pt will have less than 2/10 overall pain with usual activity.    Status  Achieved            Plan - 04/02/19 0920    Clinical Impression Statement  Pt is at her end of plan of care. She has met most of her PT goals but does still have knee flexion stiffness (AROM 114 deg and PROM 123 deg) , and some mild hip strength deficits and mild balance deficits. Knee strength is WNL. She will see MD today for progress visit. At this point PT recommends continuing a few more weeks to get her at maximum function if MD is in agreement. POC will be extended today in the event MD recommends she continue.     Comorbidities  RXV:QMGQ Rt THA now with Rt leg longer and wears lift in Lt shoe 04/21/16, anx,backpain,dep,HTN,osteopenia    Rehab Potential  Good    PT Frequency  3x / week    PT Duration  6 weeks    PT Treatment/Interventions  Cryotherapy;Electrical Stimulation;Iontophoresis 18m/ml Dexamethasone;Moist Heat;Traction;Ultrasound;Gait training;Stair training;Therapeutic activities;Therapeutic exercise;Neuromuscular re-education;Passive range of motion;Dry needling;Manual techniques;Joint Manipulations;Taping    PT Next Visit Plan  ERO, knee ROM, hip and knee strength and endurance    PT Home Exercise Plan  [id number] , HSS, heel slides, knee flexion lunge stretch, kneeling flexion stretch, quad stretch, sit to stands, stand march, SLS,  SL hip abd & clams; fwd step down; hip hike    Consulted and Agree with Plan of Care  Patient       Patient will benefit from skilled therapeutic intervention in order to improve the following deficits and impairments:  Decreased activity tolerance, Decreased endurance, Decreased range of motion, Abnormal gait, Decreased strength, Hypomobility, Difficulty walking, Increased edema, Impaired  flexibility, Increased fascial restricitons, Pain  Visit Diagnosis: Difficulty in walking, not elsewhere classified  Stiffness of right  knee, not elsewhere classified  Acute pain of right knee     Problem List Patient Active Problem List   Diagnosis Date Noted  . 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 Chronic pain of left knee 09/06/2017  . Unilateral primary osteoarthritis, left knee 09/06/2017  . Chronic pain of right knee 09/06/2017  . Leg length difference, acquired 03/06/2017  . Low back pain 03/06/2017  . Unilateral primary osteoarthritis, right knee 12/06/2016  . 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 Degenerative arthritis of hip 04/19/2012  . Anxiety   . Cancer ([place])   . Depression   . Arthritis   . RENAL CALCULUS, RIGHT 12/15/2008  . ABDOMINAL PAIN, UNSPECIFIED SITE 12/15/2008  . NEPHROLITHIASIS, HX OF 12/15/2008  . ANXIETY DEPRESSION 10/29/2007  . CONTACT DERMATITIS DUE TO SOLAR RADIATION 10/29/2007  . OSTEOARTHROSIS, LOCAL, PRIMARY, [facility] SITE 08/16/2007    Silvestre Mesi 04/02/2019, 9:29 AM  [facility] [address] [place], [place], [address] Phone: [phone]   Fax:  [phone]  Name: Brylan Seubert MRN: [id number] Date of Birth: 10-17-1941

## 2019-04-08 ENCOUNTER — Ambulatory Visit: Payer: Medicare HMO | Admitting: Physical Therapy

## 2019-04-08 ENCOUNTER — Other Ambulatory Visit: Payer: Self-pay

## 2019-04-08 DIAGNOSIS — M25661 Stiffness of right knee, not elsewhere classified: Secondary | ICD-10-CM

## 2019-04-08 DIAGNOSIS — M25561 Pain in right knee: Secondary | ICD-10-CM

## 2019-04-08 DIAGNOSIS — R262 Difficulty in walking, not elsewhere classified: Secondary | ICD-10-CM

## 2019-04-08 NOTE — Therapy (Signed)
[facility] [address] [place], [place], [address] Phone: [phone]   Fax:  [phone]  Physical Therapy Treatment  Patient Details  Name: [name] MRN: [id number] Date of Birth: 11/19/1940 Referring Provider (PT): Blackman, Christopher Y, MD   Encounter Date: 04/08/2019  PT End of Session - 04/08/19 1458    Visit Number  29    Number of Visits  36    Date for PT Re-Evaluation  04/30/19    Authorization Type  aetna MCR, progress visit at 25, KX modifier    PT Start Time  1401    PT Stop Time  1450    PT Time Calculation (min)  49 min    Activity Tolerance  Patient tolerated treatment well    Behavior During Therapy  [facility] for tasks assessed/performed       Past Medical History:  Diagnosis Date  . Anxiety   . Arthritis    lower back, hip  . Back pain    generally "causes severe stiffness"  . Complication of anesthesia    "felt it made her feel very goofy for several monthS with general anesthetic"  . Depression   . GERD (gastroesophageal reflux disease)   . History of kidney stones 1988   x1 episode passed on own  . Hypertension   . 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 Osteopenia   . 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 APPENDECTOMY  2010   open  . BREAST LUMPECTOMY Left 1961   benign tumor  . TOTAL HIP ARTHROPLASTY  04/19/2012   Procedure: TOTAL HIP ARTHROPLASTY ANTERIOR APPROACH;  Surgeon: Christopher Y Blackman, MD;  Location: [facility];  Service: Orthopedics;  Laterality: Left;  Left Total Hip Arthroplasty  . TOTAL HIP ARTHROPLASTY Right 04/21/2016   Procedure: RIGHT TOTAL HIP ARTHROPLASTY ANTERIOR APPROACH;  Surgeon: Christopher Y Blackman, MD;  [facility];  Service: Orthopedics;  Laterality: Right;  . TOTAL KNEE ARTHROPLASTY Right 12/20/2018   Procedure: RIGHT TOTAL KNEE ARTHROPLASTY;  Surgeon: Blackman, Christopher Y, MD;  [facility];  Service: Orthopedics;  Laterality: Right;    There were no vitals filed for this visit.  Subjective Assessment - 04/08/19 1453    Subjective  Pt relays some frustration that her knee is not doing as good as she would like. She is still having some stiffness with bending her knee and she is now having some pain at night    Pertinent History  PMH:prev Rt THA now with Rt leg longer and wears lift in Lt shoe 04/21/16, anx,backpain,dep,HTN,osteopenia    Currently in Pain?  No/denies    Pain Score  2     Pain Location  Knee    Pain Orientation  Right    Pain Descriptors / Indicators  Tightness    Pain Type  Surgical pain                       [facility] Adult PT Treatment/Exercise - 04/08/19 0001      Knee/Hip Exercises: Stretches   Active Hamstring Stretch  Right;3 reps;30 seconds    Active Hamstring Stretch Limitations  standing    Quad Stretch  30 seconds    Quad Stretch Limitations  3 reps prone with PT manual stretching      Knee/Hip Exercises: Aerobic   Recumbent Bike  L3X 6 min      Knee/Hip   Exercises: Standing   Other Standing Knee Exercises  SLS on Rt 3 reps    Other Standing Knee Exercises  up/down stairs with no handrails X 10, when going up she is skipping a step for strength progression      Knee/Hip Exercises: Seated   Other Seated Knee/Hip Exercises  simulated getting up and down from floor and getting in and out of canoe      Manual Therapy   Manual therapy comments  PROM, manual quad strengthening, patella and knee flexion mobs, STM/IASTM to quads                  PT Long Term Goals - 04/02/19 0924      PT LONG TERM GOAL #1   Title  Pt will be I and compliant with HEP.    Status  Achieved      PT LONG TERM GOAL #2   Title  Pt will improve Rt knee ROM to 0-115 deg to improve functional abilities.    Baseline  met PROM not for AROM    Status  Partially Met      PT LONG TERM GOAL #3   Title  Pt will improve hip/knee strength to  grossly 5/5 MMT tested in sitting.     Status  Partially Met      PT LONG TERM GOAL #4   Title  Pt will be able to ambulate communtiy distances and walk on uneven terrain with [facility] gait pattern and return to walking trails/hiking.     Status  Achieved      PT LONG TERM GOAL #5   Title  Pt will have less than 2/10 overall pain with usual activity.    Status  Achieved            Plan - 04/08/19 1459    Clinical Impression Statement  Pt had good report at her last MD visit but MD did agree that more PT would be beneficial to continue to address her knee flexon stiffness and hip weakness. Session focused on this with good tolerance. PT will continue to progress these areas as able.    Comorbidities  PMH:prev Rt THA now with Rt leg longer and wears lift in Lt shoe 04/21/16, anx,backpain,dep,HTN,osteopenia    Rehab Potential  Good    PT Frequency  3x / week    PT Duration  6 weeks    PT Treatment/Interventions  Cryotherapy;Electrical Stimulation;Iontophoresis 4mg/ml Dexamethasone;Moist Heat;Traction;Ultrasound;Gait training;Stair training;Therapeutic activities;Therapeutic exercise;Neuromuscular re-education;Passive range of motion;Dry needling;Manual techniques;Joint Manipulations;Taping    PT Next Visit Plan  ERO, knee ROM, hip and knee strength and endurance    PT Home Exercise Plan  [id number] , HSS, heel slides, knee flexion lunge stretch, kneeling flexion stretch, quad stretch, sit to stands, stand march, SLS,  SL hip abd & clams; fwd step down; hip hike    Consulted and Agree with Plan of Care  Patient       Patient will benefit from skilled therapeutic intervention in order to improve the following deficits and impairments:  Decreased activity tolerance, Decreased endurance, Decreased range of motion, Abnormal gait, Decreased strength, Hypomobility, Difficulty walking, Increased edema, Impaired flexibility, Increased fascial restricitons, Pain  Visit Diagnosis: Stiffness of right  knee, not elsewhere classified  Acute pain of right knee  Difficulty in walking, not elsewhere classified     Problem List Patient Active Problem List   Diagnosis Date Noted  . 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 Chronic   pain of left knee 09/06/2017  . Unilateral primary osteoarthritis, left knee 09/06/2017  . Chronic pain of right knee 09/06/2017  . Leg length difference, acquired 03/06/2017  . Low back pain 03/06/2017  . Unilateral primary osteoarthritis, right knee 12/06/2016  . 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 Degenerative arthritis of hip 04/19/2012  . Anxiety   . Cancer ([place])   . Depression   . Arthritis   . RENAL CALCULUS, RIGHT 12/15/2008  . ABDOMINAL PAIN, UNSPECIFIED SITE 12/15/2008  . NEPHROLITHIASIS, HX OF 12/15/2008  . ANXIETY DEPRESSION 10/29/2007  . CONTACT DERMATITIS DUE TO SOLAR RADIATION 10/29/2007  . OSTEOARTHROSIS, LOCAL, PRIMARY, [facility] SITE 08/16/2007    [name] 04/08/2019, 3:01 PM  [facility] [address] [place], [place], [address] Phone: [phone]   Fax:  [phone]  Name: [name] MRN: [id number] Date of Birth: [birth year]

## 2019-04-11 ENCOUNTER — Other Ambulatory Visit: Payer: Self-pay

## 2019-04-11 ENCOUNTER — Ambulatory Visit: Payer: Medicare HMO | Admitting: Physical Therapy

## 2019-04-11 DIAGNOSIS — M25561 Pain in right knee: Secondary | ICD-10-CM

## 2019-04-11 DIAGNOSIS — M25661 Stiffness of right knee, not elsewhere classified: Secondary | ICD-10-CM | POA: Diagnosis not present

## 2019-04-11 DIAGNOSIS — R262 Difficulty in walking, not elsewhere classified: Secondary | ICD-10-CM | POA: Diagnosis not present

## 2019-04-11 NOTE — Therapy (Signed)
[place], [place], [address] Phone: [phone]   Fax:  [phone]  Physical Therapy Treatment/MCR progress note  Progress Note reporting period 03/25/19 to 04/11/19  See below for objective and subjective measurements relating to patients progress with PT.   Patient Details  Name: [name] MRN: [id number] Date of Birth: [birth year] Referring Provider (PT): Mcarthur Rossetti, MD   Encounter Date: 04/11/2019  PT End of Session - 04/11/19 1154    Visit Number  30    Number of Visits  36    Date for PT Re-Evaluation  04/30/19    Authorization Type  aetna MCR, progress visit at 8, KX modifier    PT Start Time  0900    PT Stop Time  0945    PT Time Calculation (min)  45 min    Activity Tolerance  Patient tolerated treatment well    Behavior During Therapy  [facility] for tasks assessed/performed       Past Medical History:  Diagnosis Date  . Anxiety   . Arthritis    lower back, hip  . Back pain    generally "causes severe stiffness"  . Complication of anesthesia    "felt it made her feel very goofy for several monthS with general anesthetic"  . Depression   . GERD (gastroesophageal reflux disease)   . History of kidney stones 1988   x1 episode passed on own  . Hypertension   . 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 Osteopenia   . 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 APPENDECTOMY  2010   open  . BREAST LUMPECTOMY Left 1961   benign tumor  . TOTAL HIP ARTHROPLASTY  04/19/2012   Procedure: TOTAL HIP ARTHROPLASTY ANTERIOR APPROACH;  Surgeon: Mcarthur Rossetti, MD;  Location: [facility];  Service: Orthopedics;  Laterality: Left;  Left Total Hip Arthroplasty  . TOTAL HIP ARTHROPLASTY Right 04/21/2016   Procedure: RIGHT TOTAL HIP ARTHROPLASTY ANTERIOR APPROACH;  Surgeon: Mcarthur Rossetti, MD;  Location: [facility];  Service:  Orthopedics;  Laterality: Right;  . TOTAL KNEE ARTHROPLASTY Right 12/20/2018   Procedure: RIGHT TOTAL KNEE ARTHROPLASTY;  Surgeon: Mcarthur Rossetti, MD;  Location: [facility];  Service: Orthopedics;  Laterality: Right;    There were no vitals filed for this visit.  Subjective Assessment - 04/11/19 1015    Subjective  Pt reports her knee is better today in terms of stiffness and pain, however it did give out on her yesterday, she reports no injury from this    Currently in Pain?  No/denies         [facility] PT Assessment - 04/11/19 0001      Assessment   Medical Diagnosis  Rt TKA    Referring Provider (PT)  Mcarthur Rossetti, MD      AROM   Right Knee Extension  -4    Right Knee Flexion  118      PROM   Right Knee Flexion  123      Strength   Overall Strength Comments  Rt knee 5/5 MMT, bilat hip 4+/5 grossly                   [facility] Adult PT Treatment/Exercise - 04/11/19 0001      Knee/Hip Exercises: Stretches   Active Hamstring Stretch  Right;3 reps;30 seconds    Active Hamstring Stretch Limitations  standing  Quad Stretch  30 seconds    Quad Stretch Limitations  3 reps prone with PT manual stretching    Other Knee/Hip Stretches  kneeling stretch on mat table for flexion 20 sec  x4    Other Knee/Hip Stretches  standing lunge stretch 10 sec X 10 for flexion      Knee/Hip Exercises: Aerobic   Recumbent Bike  L3X 7 min      Knee/Hip Exercises: Standing   Other Standing Knee Exercises  lunges in bars X 10 bilat    Other Standing Knee Exercises  up/down stairs with no handrails X 10,       Manual Therapy   Manual therapy comments  PROM, manual quad strengthening, patella and knee flexion mobs, STM/IASTM to quads and H.S                  PT Long Term Goals - 04/11/19 1157      PT LONG TERM GOAL #1   Title  Pt will be I and compliant with HEP.    Status  Achieved      PT LONG TERM GOAL #2   Title  Pt will improve Rt knee ROM to 0-115  deg to improve functional abilities.    Baseline  met PROM not for AROM    Status  Partially Met      PT LONG TERM GOAL #3   Title  Pt will improve hip/knee strength to grossly 5/5 MMT tested in sitting.     Status  Partially Met      PT LONG TERM GOAL #4   Title  Pt will be able to ambulate communtiy distances and walk on uneven terrain with [facility] gait pattern and return to walking trails/hiking.     Status  Achieved      PT LONG TERM GOAL #5   Title  Pt will have less than 2/10 overall pain with usual activity.    Status  Achieved            Plan - 04/11/19 1155    Clinical Impression Statement  Pt continues to progress with her ROM some and her overall LE strength with good tolerance. Her biggest deficit continues to be knee stiffness and she will continue to benefit from skilled PT to address this. She reports she is about 85% back to her normal and that PT is really helping.    Comorbidities  YDX:AJOI Rt THA now with Rt leg longer and wears lift in Lt shoe 04/21/16, anx,backpain,dep,HTN,osteopenia    Rehab Potential  Good    PT Frequency  3x / week    PT Duration  6 weeks    PT Treatment/Interventions  Cryotherapy;Electrical Stimulation;Iontophoresis 60m/ml Dexamethasone;Moist Heat;Traction;Ultrasound;Gait training;Stair training;Therapeutic activities;Therapeutic exercise;Neuromuscular re-education;Passive range of motion;Dry needling;Manual techniques;Joint Manipulations;Taping    PT Next Visit Plan  knee ROM, hip and knee strength and endurance    PT Home Exercise Plan  [id number] , HSS, heel slides, knee flexion lunge stretch, kneeling flexion stretch, quad stretch, sit to stands, stand march, SLS,  SL hip abd & clams; fwd step down; hip hike    Consulted and Agree with Plan of Care  Patient       Patient will benefit from skilled therapeutic intervention in order to improve the following deficits and impairments:  Decreased activity tolerance, Decreased endurance, Decreased  range of motion, Abnormal gait, Decreased strength, Hypomobility, Difficulty walking, Increased edema, Impaired flexibility, Increased fascial restricitons, Pain  Visit Diagnosis: Stiffness of right  knee, not elsewhere classified  Acute pain of right knee  Difficulty in walking, not elsewhere classified     Problem List Patient Active Problem List   Diagnosis Date Noted  . 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 Chronic pain of left knee 09/06/2017  . Unilateral primary osteoarthritis, left knee 09/06/2017  . Chronic pain of right knee 09/06/2017  . Leg length difference, acquired 03/06/2017  . Low back pain 03/06/2017  . Unilateral primary osteoarthritis, right knee 12/06/2016  . 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 Degenerative arthritis of hip 04/19/2012  . Anxiety   . Cancer ([place])   . Depression   . Arthritis   . RENAL CALCULUS, RIGHT 12/15/2008  . ABDOMINAL PAIN, UNSPECIFIED SITE 12/15/2008  . NEPHROLITHIASIS, HX OF 12/15/2008  . ANXIETY DEPRESSION 10/29/2007  . CONTACT DERMATITIS DUE TO SOLAR RADIATION 10/29/2007  . 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

## 2019-04-15 ENCOUNTER — Ambulatory Visit: Payer: Medicare HMO | Admitting: Physical Therapy

## 2019-04-15 ENCOUNTER — Other Ambulatory Visit: Payer: Self-pay

## 2019-04-15 DIAGNOSIS — M25561 Pain in right knee: Secondary | ICD-10-CM | POA: Diagnosis not present

## 2019-04-15 DIAGNOSIS — R262 Difficulty in walking, not elsewhere classified: Secondary | ICD-10-CM | POA: Diagnosis not present

## 2019-04-15 DIAGNOSIS — M25661 Stiffness of right knee, not elsewhere classified: Secondary | ICD-10-CM | POA: Diagnosis not present

## 2019-04-15 NOTE — Therapy (Signed)
[place], [place], [address] Phone: [phone]   Fax:  [phone]  Physical Therapy Treatment  Patient Details  Name: [name] MRN: [id number] Date of Birth: 07-30-1941 Referring Provider (PT): Mcarthur Rossetti, MD   Encounter Date: 04/15/2019  PT End of Session - 04/15/19 1029    Visit Number  31    Number of Visits  36    Date for PT Re-Evaluation  04/30/19    Authorization Type  aetna MCR, progress visit at 62, KX modifier    PT Start Time  0935    PT Stop Time  1020    PT Time Calculation (min)  45 min    Activity Tolerance  Patient tolerated treatment well    Behavior During Therapy  [facility] for tasks assessed/performed       Past Medical History:  Diagnosis Date  . Anxiety   . Arthritis    lower back, hip  . Back pain    generally "causes severe stiffness"  . Complication of anesthesia    "felt it made her feel very goofy for several monthS with general anesthetic"  . Depression   . GERD (gastroesophageal reflux disease)   . History of kidney stones 1988   x1 episode passed on own  . Hypertension   . 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 Osteopenia   . 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 APPENDECTOMY  2010   open  . BREAST LUMPECTOMY Left 1961   benign tumor  . TOTAL HIP ARTHROPLASTY  04/19/2012   Procedure: TOTAL HIP ARTHROPLASTY ANTERIOR APPROACH;  Surgeon: Mcarthur Rossetti, MD;  [facility]: [facility];  Service: Orthopedics;  Laterality: Left;  Left Total Hip Arthroplasty  . TOTAL HIP ARTHROPLASTY Right 04/21/2016   Procedure: RIGHT TOTAL HIP ARTHROPLASTY ANTERIOR APPROACH;  Surgeon: Mcarthur Rossetti, MD;  Location: [facility];  Service: Orthopedics;  Laterality: Right;  . TOTAL KNEE ARTHROPLASTY Right 12/20/2018   Procedure: RIGHT TOTAL KNEE ARTHROPLASTY;  Surgeon: Mcarthur Rossetti, MD;  [facility];  Service: Orthopedics;  Laterality: Right;    There were no vitals filed for this visit.  Subjective Assessment - 04/15/19 1026    Subjective  Pt relays her knee has been feeling better, she thinks the IASTM has been really helping with the tightness. She also says she will be getting a heel lift due to Lt leg being longer.    Currently in Pain?  No/denies                       [facility] Adult PT Treatment/Exercise - 04/15/19 0001      Knee/Hip Exercises: Stretches   Active Hamstring Stretch  Right;3 reps;30 seconds    Active Hamstring Stretch Limitations  standing    Quad Stretch  30 seconds    Quad Stretch Limitations  3 reps prone with PT manual stretching    Gastroc Stretch  Both;3 reps;30 seconds    Gastroc Stretch Limitations  slantboard    Other Knee/Hip Stretches  kneeling stretch on mat table for flexion 20 sec  x4    Other Knee/Hip Stretches  standing lunge stretch 10 sec X 10 for flexion      Knee/Hip Exercises: Aerobic   Recumbent Bike  L3X 7 min      Knee/Hip Exercises: Standing   Other Standing Knee Exercises  lunges  in bars X 12 bilat with UE support    Other Standing Knee Exercises  up/down stairs with no handrails X 10      Manual Therapy   Manual therapy comments  PROM, manual quad strengthening, patella and knee flexion mobs, STM/IASTM to quads and H.S                  PT Long Term Goals - 04/11/19 1157      PT LONG TERM GOAL #1   Title  Pt will be I and compliant with HEP.    Status  Achieved      PT LONG TERM GOAL #2   Title  Pt will improve Rt knee ROM to 0-115 deg to improve functional abilities.    Baseline  met PROM not for AROM    Status  Partially Met      PT LONG TERM GOAL #3   Title  Pt will improve hip/knee strength to grossly 5/5 MMT tested in sitting.     Status  Partially Met      PT LONG TERM GOAL #4   Title  Pt will be able to ambulate communtiy distances and walk on uneven terrain with [facility] gait pattern  and return to walking trails/hiking.     Status  Achieved      PT LONG TERM GOAL #5   Title  Pt will have less than 2/10 overall pain with usual activity.    Status  Achieved            Plan - 04/15/19 1029    Clinical Impression Statement  Pt appears to be slowly progressing her ROM and appears to have less tightness in quads, H.S. and calf on Rt leg however she still has some restrictions and tightness limiting her full ROM. Session focused on stretching and manual therapy to help this tightness. Continue POC    Comorbidities  EML:JQGB Rt THA now with Rt leg longer and wears lift in Lt shoe 04/21/16, anx,backpain,dep,HTN,osteopenia    Rehab Potential  Good    PT Frequency  3x / week    PT Duration  6 weeks    PT Treatment/Interventions  Cryotherapy;Electrical Stimulation;Iontophoresis 65m/ml Dexamethasone;Moist Heat;Traction;Ultrasound;Gait training;Stair training;Therapeutic activities;Therapeutic exercise;Neuromuscular re-education;Passive range of motion;Dry needling;Manual techniques;Joint Manipulations;Taping    PT Next Visit Plan  knee ROM, hip and knee strength and endurance    PT Home Exercise Plan  [id number] , HSS, heel slides, knee flexion lunge stretch, kneeling flexion stretch, quad stretch, sit to stands, stand march, SLS,  SL hip abd & clams; fwd step down; hip hike    Consulted and Agree with Plan of Care  Patient       Patient will benefit from skilled therapeutic intervention in order to improve the following deficits and impairments:  Decreased activity tolerance, Decreased endurance, Decreased range of motion, Abnormal gait, Decreased strength, Hypomobility, Difficulty walking, Increased edema, Impaired flexibility, Increased fascial restricitons, Pain  Visit Diagnosis: 1. Stiffness of right knee, not elsewhere classified   2. Acute pain of right knee   3. Difficulty in walking, not elsewhere classified        Problem List Patient Active Problem List    Diagnosis Date Noted  . 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 Chronic pain of left knee 09/06/2017  . Unilateral primary osteoarthritis, left knee 09/06/2017  . Chronic pain of right knee 09/06/2017  . Leg length difference, acquired 03/06/2017  . Low back pain 03/06/2017  .  Unilateral primary osteoarthritis, right knee 12/06/2016  . 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 Degenerative arthritis of hip 04/19/2012  . Anxiety   . Cancer ([place])   . Depression   . Arthritis   . RENAL CALCULUS, RIGHT 12/15/2008  . ABDOMINAL PAIN, UNSPECIFIED SITE 12/15/2008  . NEPHROLITHIASIS, HX OF 12/15/2008  . ANXIETY DEPRESSION 10/29/2007  . CONTACT DERMATITIS DUE TO SOLAR RADIATION 10/29/2007  . OSTEOARTHROSIS, LOCAL, PRIMARY, [facility] SITE 08/16/2007    Silvestre Mesi 04/15/2019, 10:34 AM  [facility] [address] [place], [place], [address] Phone: [phone]   Fax:  [phone]  Name: Izzabelle Bouley MRN: [id number] Date of Birth: 1940-12-30

## 2019-04-17 ENCOUNTER — Ambulatory Visit: Payer: Medicare HMO | Admitting: Physical Therapy

## 2019-04-17 ENCOUNTER — Other Ambulatory Visit: Payer: Self-pay

## 2019-04-17 DIAGNOSIS — R262 Difficulty in walking, not elsewhere classified: Secondary | ICD-10-CM

## 2019-04-17 DIAGNOSIS — M25561 Pain in right knee: Secondary | ICD-10-CM

## 2019-04-17 DIAGNOSIS — M25661 Stiffness of right knee, not elsewhere classified: Secondary | ICD-10-CM | POA: Diagnosis not present

## 2019-04-17 NOTE — Therapy (Signed)
[place], [place], [address] Phone: [phone]   Fax:  [phone]  Physical Therapy Treatment  Patient Details  Name: [name] MRN: [id number] Date of Birth: 1941-10-12 Referring Provider (PT): Mcarthur Rossetti, MD   Encounter Date: 04/17/2019  PT End of Session - 04/17/19 1010    Visit Number  32    Number of Visits  36    Date for PT Re-Evaluation  04/30/19    Authorization Type  aetna MCR, progress visit at 90, KX modifier    PT Start Time  0930    PT Stop Time  1000    PT Time Calculation (min)  30 min    Activity Tolerance  Patient tolerated treatment well    Behavior During Therapy  [facility] for tasks assessed/performed       Past Medical History:  Diagnosis Date  . Anxiety   . Arthritis    lower back, hip  . Back pain    generally "causes severe stiffness"  . Complication of anesthesia    "felt it made her feel very goofy for several monthS with general anesthetic"  . Depression   . GERD (gastroesophageal reflux disease)   . History of kidney stones 1988   x1 episode passed on own  . Hypertension   . 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 Osteopenia   . 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 APPENDECTOMY  2010   open  . BREAST LUMPECTOMY Left 1961   benign tumor  . TOTAL HIP ARTHROPLASTY  04/19/2012   Procedure: TOTAL HIP ARTHROPLASTY ANTERIOR APPROACH;  Surgeon: Mcarthur Rossetti, MD;  Location: [facility];  Service: Orthopedics;  Laterality: Left;  Left Total Hip Arthroplasty  . TOTAL HIP ARTHROPLASTY Right 04/21/2016   Procedure: RIGHT TOTAL HIP ARTHROPLASTY ANTERIOR APPROACH;  Surgeon: Mcarthur Rossetti, MD;  Location: [facility];  Service: Orthopedics;  Laterality: Right;  . TOTAL KNEE ARTHROPLASTY Right 12/20/2018   Procedure: RIGHT TOTAL KNEE ARTHROPLASTY;  Surgeon: Mcarthur Rossetti, MD;  [facility];  Service: Orthopedics;  Laterality: Right;    There were no vitals filed for this visit.  Subjective Assessment - 04/17/19 1007    Subjective  she will be getting a heel lift due today due to  Lt leg being longer. She feels that this may help with her Rt knee extension stiffness as she always stands with her Rt knee a little bent due to the LLD    Pertinent History  HUT:MLYY Rt THA now with Rt leg longer and wears lift in Lt shoe 04/21/16, anx,backpain,dep,HTN,osteopenia    Limitations  Standing;Walking    How long can you sit comfortably?  not limited    How long can you stand comfortably?  30  min    How long can you walk comfortably?  .25 mile    Patient Stated Goals  get my knee more ROM and get back to hiking    Currently in Pain?  No/denies    Pain Onset  More than a month ago         [facility] PT Assessment - 04/17/19 0001      Assessment   Medical Diagnosis  Rt TKA    Referring Provider (PT)  Mcarthur Rossetti, MD      AROM   Right Knee Extension  -3    Right Knee Flexion  119  PROM   Right Knee Flexion  124                   [facility] Adult PT Treatment/Exercise - 04/17/19 0001      Knee/Hip Exercises: Stretches   Active Hamstring Stretch  Right;3 reps;30 seconds    Active Hamstring Stretch Limitations  standing    Quad Stretch  30 seconds    Quad Stretch Limitations  3 reps prone with PT manual stretching    Other Knee/Hip Stretches  standing lunge stretch 10 sec X 5 for flexion      Knee/Hip Exercises: Aerobic   Recumbent Bike  L3X 5 min      Manual Therapy   Manual therapy comments  PROM, manual quad strengthening, patella and knee flexion mobs, STM/IASTM to quads and H.S                  PT Long Term Goals - 04/11/19 1157      PT LONG TERM GOAL #1   Title  Pt will be I and compliant with HEP.    Status  Achieved      PT LONG TERM GOAL #2   Title  Pt will improve Rt knee ROM to 0-115 deg to improve functional abilities.     Baseline  met PROM not for AROM    Status  Partially Met      PT LONG TERM GOAL #3   Title  Pt will improve hip/knee strength to grossly 5/5 MMT tested in sitting.     Status  Partially Met      PT LONG TERM GOAL #4   Title  Pt will be able to ambulate communtiy distances and walk on uneven terrain with [facility] gait pattern and return to walking trails/hiking.     Status  Achieved      PT LONG TERM GOAL #5   Title  Pt will have less than 2/10 overall pain with usual activity.    Status  Achieved            Plan - 04/17/19 1010    Clinical Impression Statement  Showed some improvements in ROM today. Continuing to have knee ext stiffness but hopefully this will help with new heel lift she is getting from [employer] to be placed in her lt shoe as her Rt leg is longer. Session had to be cut short today so she could make her appointment with [employer] so some therex was held due to time constraints.    Comorbidities  KNL:ZJQB Rt THA now with Rt leg longer and wears lift in Lt shoe 04/21/16, anx,backpain,dep,HTN,osteopenia    Rehab Potential  Good    PT Frequency  3x / week    PT Duration  6 weeks    PT Treatment/Interventions  Cryotherapy;Electrical Stimulation;Iontophoresis 73m/ml Dexamethasone;Moist Heat;Traction;Ultrasound;Gait training;Stair training;Therapeutic activities;Therapeutic exercise;Neuromuscular re-education;Passive range of motion;Dry needling;Manual techniques;Joint Manipulations;Taping    PT Next Visit Plan  knee ROM, hip and knee strength and endurance    PT Home Exercise Plan  [id number] , HSS, heel slides, knee flexion lunge stretch, kneeling flexion stretch, quad stretch, sit to stands, stand march, SLS,  SL hip abd & clams; fwd step down; hip hike    Consulted and Agree with Plan of Care  Patient       Patient will benefit from skilled therapeutic intervention in order to improve the following deficits and impairments:  Decreased activity tolerance, Decreased endurance,  Decreased range of motion, Abnormal gait, Decreased  strength, Hypomobility, Difficulty walking, Increased edema, Impaired flexibility, Increased fascial restricitons, Pain  Visit Diagnosis: 1. Stiffness of right knee, not elsewhere classified   2. Acute pain of right knee   3. Difficulty in walking, not elsewhere classified        Problem List Patient Active Problem List   Diagnosis Date Noted  . 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 Chronic pain of left knee 09/06/2017  . Unilateral primary osteoarthritis, left knee 09/06/2017  . Chronic pain of right knee 09/06/2017  . Leg length difference, acquired 03/06/2017  . Low back pain 03/06/2017  . Unilateral primary osteoarthritis, right knee 12/06/2016  . 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 Degenerative arthritis of hip 04/19/2012  . Anxiety   . Cancer ([place])   . Depression   . Arthritis   . RENAL CALCULUS, RIGHT 12/15/2008  . ABDOMINAL PAIN, UNSPECIFIED SITE 12/15/2008  . NEPHROLITHIASIS, HX OF 12/15/2008  . ANXIETY DEPRESSION 10/29/2007  . CONTACT DERMATITIS DUE TO SOLAR RADIATION 10/29/2007  . OSTEOARTHROSIS, LOCAL, PRIMARY, [facility] SITE 08/16/2007    Silvestre Mesi 04/17/2019, 10:14 AM  [place], [place], [address] Phone: [phone]   Fax:  [phone]  Name: [name] MRN: [id number] Date of Birth: 12-13-1940

## 2019-04-22 ENCOUNTER — Other Ambulatory Visit: Payer: Self-pay

## 2019-04-22 ENCOUNTER — Ambulatory Visit: Payer: Medicare HMO | Admitting: Physical Therapy

## 2019-04-22 DIAGNOSIS — M25661 Stiffness of right knee, not elsewhere classified: Secondary | ICD-10-CM | POA: Diagnosis not present

## 2019-04-22 DIAGNOSIS — R262 Difficulty in walking, not elsewhere classified: Secondary | ICD-10-CM | POA: Diagnosis not present

## 2019-04-22 DIAGNOSIS — M25561 Pain in right knee: Secondary | ICD-10-CM | POA: Diagnosis not present

## 2019-04-22 NOTE — Therapy (Signed)
[place], [place], [address] Phone: [phone]   Fax:  [phone]  Physical Therapy Treatment  Patient Details  Name: [name] MRN: [id number] Date of Birth: 02-Mar-1941 Referring Provider (PT): Mcarthur Rossetti, MD   Encounter Date: 04/22/2019  PT End of Session - 04/22/19 1138    Visit Number  33    Number of Visits  36    Date for PT Re-Evaluation  04/30/19    Authorization Type  aetna MCR, progress visit at 78, KX modifier    PT Start Time  0933    PT Stop Time  1030    PT Time Calculation (min)  57 min    Activity Tolerance  Patient tolerated treatment well    Behavior During Therapy  [facility] for tasks assessed/performed       Past Medical History:  Diagnosis Date  . Anxiety   . Arthritis    lower back, hip  . Back pain    generally "causes severe stiffness"  . Complication of anesthesia    "felt it made her feel very goofy for several monthS with general anesthetic"  . Depression   . GERD (gastroesophageal reflux disease)   . History of kidney stones 1988   x1 episode passed on own  . Hypertension   . 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 Osteopenia   . 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 APPENDECTOMY  2010   open  . BREAST LUMPECTOMY Left 1961   benign tumor  . TOTAL HIP ARTHROPLASTY  04/19/2012   Procedure: TOTAL HIP ARTHROPLASTY ANTERIOR APPROACH;  Surgeon: Mcarthur Rossetti, MD;  Location: [facility];  Service: Orthopedics;  Laterality: Left;  Left Total Hip Arthroplasty  . TOTAL HIP ARTHROPLASTY Right 04/21/2016   Procedure: RIGHT TOTAL HIP ARTHROPLASTY ANTERIOR APPROACH;  Surgeon: Mcarthur Rossetti, MD;  Location: [facility];  Service: Orthopedics;  Laterality: Right;  . TOTAL KNEE ARTHROPLASTY Right 12/20/2018   Procedure: RIGHT TOTAL KNEE ARTHROPLASTY;  Surgeon: Mcarthur Rossetti, MD;  Location: [facility];  Service: Orthopedics;  Laterality: Right;    There were no vitals filed for this visit.  Subjective Assessment - 04/22/19 1045    Subjective  Pt relays she got new heel lift on shoe but it may be too much ht and she feels off a little bit    Currently in Pain?  No/denies                       [facility] Adult PT Treatment/Exercise - 04/22/19 0001      Knee/Hip Exercises: Stretches   Active Hamstring Stretch  Right;3 reps;30 seconds    Active Hamstring Stretch Limitations  standing    Quad Stretch  30 seconds    Quad Stretch Limitations  3 reps supine with strap, leg off edge of table    Other Knee/Hip Stretches  kneeling stretch on mat table for flexion 20 sec  x3    Other Knee/Hip Stretches  standing lunge stretch 10 sec X 10 for flexion      Knee/Hip Exercises: Aerobic   Recumbent Bike  L3X 7 min      Knee/Hip Exercises: Standing   Other Standing Knee Exercises  lunges in bars X 12 bilat with UE support    Other Standing Knee Exercises  up/down stairs with no handrails X 10, then lateral step  ups on Rt X 15      Manual Therapy   Manual therapy comments  PROM, manual quad strengthening, patella and knee flexion mobs, STM/IASTM to quads and H.S                  PT Long Term Goals - 04/11/19 1157      PT LONG TERM GOAL #1   Title  Pt will be I and compliant with HEP.    Status  Achieved      PT LONG TERM GOAL #2   Title  Pt will improve Rt knee ROM to 0-115 deg to improve functional abilities.    Baseline  met PROM not for AROM    Status  Partially Met      PT LONG TERM GOAL #3   Title  Pt will improve hip/knee strength to grossly 5/5 MMT tested in sitting.     Status  Partially Met      PT LONG TERM GOAL #4   Title  Pt will be able to ambulate communtiy distances and walk on uneven terrain with [facility] gait pattern and return to walking trails/hiking.     Status  Achieved      PT LONG TERM GOAL #5   Title  Pt will have less than 2/10 overall  pain with usual activity.    Status  Achieved            Plan - 04/22/19 1140    Clinical Impression Statement  Pt has new heelift on Lt shoe from orthotist and it appears she may have slightly elevated Lt hip now during gait, she will have another appointment with him on thursday for adjustements if needed. She still has quad and hamstring tightness limiting her ROM but this is slowly improving with PT.    Comorbidities  HQI:ONGE Rt THA now with Rt leg longer and wears lift in Lt shoe 04/21/16, anx,backpain,dep,HTN,osteopenia    Rehab Potential  Good    PT Frequency  3x / week    PT Duration  6 weeks    PT Treatment/Interventions  Cryotherapy;Electrical Stimulation;Iontophoresis 70m/ml Dexamethasone;Moist Heat;Traction;Ultrasound;Gait training;Stair training;Therapeutic activities;Therapeutic exercise;Neuromuscular re-education;Passive range of motion;Dry needling;Manual techniques;Joint Manipulations;Taping    PT Next Visit Plan  knee ROM, hip and knee strength and endurance    PT Home Exercise Plan  [id number] , HSS, heel slides, knee flexion lunge stretch, kneeling flexion stretch, quad stretch, sit to stands, stand march, SLS,  SL hip abd & clams; fwd step down; hip hike    Consulted and Agree with Plan of Care  Patient       Patient will benefit from skilled therapeutic intervention in order to improve the following deficits and impairments:  Decreased activity tolerance, Decreased endurance, Decreased range of motion, Abnormal gait, Decreased strength, Hypomobility, Difficulty walking, Increased edema, Impaired flexibility, Increased fascial restricitons, Pain  Visit Diagnosis: 1. Stiffness of right knee, not elsewhere classified   2. Acute pain of right knee   3. Difficulty in walking, not elsewhere classified        Problem List Patient Active Problem List   Diagnosis Date Noted  . 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 Chronic pain of left knee 09/06/2017   . Unilateral primary osteoarthritis, left knee 09/06/2017  . Chronic pain of right knee 09/06/2017  . Leg length difference, acquired 03/06/2017  . Low back pain 03/06/2017  . Unilateral primary osteoarthritis, right knee 12/06/2016  . Status post total replacement of  right hip 04/21/2016  . Degenerative arthritis of hip 04/19/2012  . Anxiety   . Cancer ([place])   . Depression   . Arthritis   . RENAL CALCULUS, RIGHT 12/15/2008  . ABDOMINAL PAIN, UNSPECIFIED SITE 12/15/2008  . NEPHROLITHIASIS, HX OF 12/15/2008  . ANXIETY DEPRESSION 10/29/2007  . CONTACT DERMATITIS DUE TO SOLAR RADIATION 10/29/2007  . 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 [place], [place], [address] Phone: [phone]   Fax:  [phone]  Name: [name] MRN: [id number] Date of Birth: 01/23/1941

## 2019-04-24 ENCOUNTER — Other Ambulatory Visit: Payer: Self-pay

## 2019-04-24 ENCOUNTER — Ambulatory Visit: Payer: Medicare HMO | Admitting: Physical Therapy

## 2019-04-24 DIAGNOSIS — M25561 Pain in right knee: Secondary | ICD-10-CM | POA: Diagnosis not present

## 2019-04-24 DIAGNOSIS — R262 Difficulty in walking, not elsewhere classified: Secondary | ICD-10-CM

## 2019-04-24 DIAGNOSIS — M25661 Stiffness of right knee, not elsewhere classified: Secondary | ICD-10-CM

## 2019-04-24 NOTE — Therapy (Signed)
[place], [place], [address] Phone: [phone]   Fax:  [phone]  Physical Therapy Treatment  Patient Details  Name: [name] MRN: [id number] Date of Birth: Nov 28, 1940 Referring Provider (PT): Mcarthur Rossetti, MD   Encounter Date: 04/24/2019  PT End of Session - 04/24/19 1006    Visit Number  34    Number of Visits  36    Date for PT Re-Evaluation  04/30/19    Authorization Type  aetna MCR, progress visit at 62, KX modifier    PT Start Time  0930    PT Stop Time  1020    PT Time Calculation (min)  50 min    Activity Tolerance  Patient tolerated treatment well    Behavior During Therapy  [facility] for tasks assessed/performed       Past Medical History:  Diagnosis Date  . Anxiety   . Arthritis    lower back, hip  . Back pain    generally "causes severe stiffness"  . Complication of anesthesia    "felt it made her feel very goofy for several monthS with general anesthetic"  . Depression   . GERD (gastroesophageal reflux disease)   . History of kidney stones 1988   x1 episode passed on own  . Hypertension   . 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 Osteopenia   . 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 APPENDECTOMY  2010   open  . BREAST LUMPECTOMY Left 1961   benign tumor  . TOTAL HIP ARTHROPLASTY  04/19/2012   Procedure: TOTAL HIP ARTHROPLASTY ANTERIOR APPROACH;  Surgeon: Mcarthur Rossetti, MD;  [facility]: [facility];  Service: Orthopedics;  Laterality: Left;  Left Total Hip Arthroplasty  . TOTAL HIP ARTHROPLASTY Right 04/21/2016   Procedure: RIGHT TOTAL HIP ARTHROPLASTY ANTERIOR APPROACH;  Surgeon: Mcarthur Rossetti, MD;  Location: [facility];  Service: Orthopedics;  Laterality: Right;  . TOTAL KNEE ARTHROPLASTY Right 12/20/2018   Procedure: RIGHT TOTAL KNEE ARTHROPLASTY;  Surgeon: Mcarthur Rossetti, MD;  [facility];  Service: Orthopedics;  Laterality: Right;    There were no vitals filed for this visit.  Subjective Assessment - 04/24/19 1005    Subjective  Pt relays knee is doing better, has done her exercises already this morning. She will see orthotist again today to adjust heel lift on shoe    Currently in Pain?  No/denies         [facility] PT Assessment - 04/24/19 0001      Assessment   Medical Diagnosis  Rt TKA    Referring Provider (PT)  Mcarthur Rossetti, MD      AROM   Right Knee Extension  -2    Right Knee Flexion  120      PROM   Right Knee Flexion  125                   [facility] Adult PT Treatment/Exercise - 04/24/19 0001      Knee/Hip Exercises: Stretches   Active Hamstring Stretch  Right;3 reps;30 seconds    Active Hamstring Stretch Limitations  standing    Quad Stretch  30 seconds    Quad Stretch Limitations  prone manual    Other Knee/Hip Stretches  kneeling stretch on mat table for flexion 20 sec  x3    Other Knee/Hip Stretches  standing lunge stretch 10 sec  X 10 for flexion      Knee/Hip Exercises: Aerobic   Recumbent Bike  L3X 7 min      Knee/Hip Exercises: Standing   Other Standing Knee Exercises  lunges in bars X 12 bilat with UE support    Other Standing Knee Exercises  up/down stairs with no handrails X 10, then lateral step ups  X 15 bilat      Knee/Hip Exercises: Sidelying   Other Sidelying Knee/Hip Exercises  SLR supine flexion and SL hip abd 2X 10 bilat      Manual Therapy   Manual therapy comments  PROM, manual quad strengthening, patella and knee flexion mobs, STM/IASTM to quads and H.S                  PT Long Term Goals - 04/11/19 1157      PT LONG TERM GOAL #1   Title  Pt will be I and compliant with HEP.    Status  Achieved      PT LONG TERM GOAL #2   Title  Pt will improve Rt knee ROM to 0-115 deg to improve functional abilities.    Baseline  met PROM not for AROM    Status  Partially Met      PT LONG TERM  GOAL #3   Title  Pt will improve hip/knee strength to grossly 5/5 MMT tested in sitting.     Status  Partially Met      PT LONG TERM GOAL #4   Title  Pt will be able to ambulate communtiy distances and walk on uneven terrain with [facility] gait pattern and return to walking trails/hiking.     Status  Achieved      PT LONG TERM GOAL #5   Title  Pt will have less than 2/10 overall pain with usual activity.    Status  Achieved            Plan - 04/24/19 1022    Clinical Impression Statement  ROM improving and quad, H.S. tightness improving. Added in more hip strengthening today with good tolerance. She was 2 more visits left and PT anticipates she will be ready for DC then.    Comorbidities  HQI:ONGE Rt THA now with Rt leg longer and wears lift in Lt shoe 04/21/16, anx,backpain,dep,HTN,osteopenia    Rehab Potential  Good    PT Frequency  3x / week    PT Duration  6 weeks    PT Treatment/Interventions  Cryotherapy;Electrical Stimulation;Iontophoresis 57m/ml Dexamethasone;Moist Heat;Traction;Ultrasound;Gait training;Stair training;Therapeutic activities;Therapeutic exercise;Neuromuscular re-education;Passive range of motion;Dry needling;Manual techniques;Joint Manipulations;Taping    PT Next Visit Plan  knee ROM, hip and knee strength and endurance    PT Home Exercise Plan  [id number] , HSS, heel slides, knee flexion lunge stretch, kneeling flexion stretch, quad stretch, sit to stands, stand march, SLS,  SL hip abd & clams; fwd step down; hip hike    Consulted and Agree with Plan of Care  Patient       Patient will benefit from skilled therapeutic intervention in order to improve the following deficits and impairments:  Decreased activity tolerance, Decreased endurance, Decreased range of motion, Abnormal gait, Decreased strength, Hypomobility, Difficulty walking, Increased edema, Impaired flexibility, Increased fascial restricitons, Pain  Visit Diagnosis: 1. Stiffness of right knee, not  elsewhere classified   2. Acute pain of right knee   3. Difficulty in walking, not elsewhere classified        Problem List Patient Active Problem  List   Diagnosis Date Noted  . 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 Chronic pain of left knee 09/06/2017  . Unilateral primary osteoarthritis, left knee 09/06/2017  . Chronic pain of right knee 09/06/2017  . Leg length difference, acquired 03/06/2017  . Low back pain 03/06/2017  . Unilateral primary osteoarthritis, right knee 12/06/2016  . 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 Degenerative arthritis of hip 04/19/2012  . Anxiety   . Cancer ([place])   . Depression   . Arthritis   . RENAL CALCULUS, RIGHT 12/15/2008  . ABDOMINAL PAIN, UNSPECIFIED SITE 12/15/2008  . NEPHROLITHIASIS, HX OF 12/15/2008  . ANXIETY DEPRESSION 10/29/2007  . CONTACT DERMATITIS DUE TO SOLAR RADIATION 10/29/2007  . OSTEOARTHROSIS, LOCAL, PRIMARY, [facility] SITE 08/16/2007    Silvestre Mesi 04/24/2019, 10:27 AM  [facility] [address] [place], [place], [address] Phone: [phone]   Fax:  [phone]  Name: [name] MRN: [id number] Date of Birth: 18-Aug-1941

## 2019-04-29 ENCOUNTER — Other Ambulatory Visit: Payer: Self-pay

## 2019-04-29 ENCOUNTER — Ambulatory Visit: Payer: Medicare HMO | Admitting: Physical Therapy

## 2019-04-29 DIAGNOSIS — M25661 Stiffness of right knee, not elsewhere classified: Secondary | ICD-10-CM | POA: Diagnosis not present

## 2019-04-29 DIAGNOSIS — R262 Difficulty in walking, not elsewhere classified: Secondary | ICD-10-CM | POA: Diagnosis not present

## 2019-04-29 DIAGNOSIS — M25561 Pain in right knee: Secondary | ICD-10-CM

## 2019-04-29 NOTE — Therapy (Signed)
[place], [place], [address] Phone: [phone]   Fax:  [phone]  Physical Therapy Treatment  Patient Details  Name: [name] MRN: [id number] Date of Birth: 13-Feb-1941 Referring Provider (PT): Mcarthur Rossetti, MD   Encounter Date: 04/29/2019  PT End of Session - 04/29/19 1153    Visit Number  35    Number of Visits  36    Date for PT Re-Evaluation  04/30/19    Authorization Type  aetna MCR, progress visit at 45, KX modifier    PT Start Time  0930    PT Stop Time  1020    PT Time Calculation (min)  50 min    Activity Tolerance  Patient tolerated treatment well    Behavior During Therapy  [facility] for tasks assessed/performed       Past Medical History:  Diagnosis Date  . Anxiety   . Arthritis    lower back, hip  . Back pain    generally "causes severe stiffness"  . Complication of anesthesia    "felt it made her feel very goofy for several monthS with general anesthetic"  . Depression   . GERD (gastroesophageal reflux disease)   . History of kidney stones 1988   x1 episode passed on own  . Hypertension   . 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 Osteopenia   . 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 APPENDECTOMY  2010   open  . BREAST LUMPECTOMY Left 1961   benign tumor  . TOTAL HIP ARTHROPLASTY  04/19/2012   Procedure: TOTAL HIP ARTHROPLASTY ANTERIOR APPROACH;  Surgeon: Mcarthur Rossetti, MD;  Location: [facility];  Service: Orthopedics;  Laterality: Left;  Left Total Hip Arthroplasty  . TOTAL HIP ARTHROPLASTY Right 04/21/2016   Procedure: RIGHT TOTAL HIP ARTHROPLASTY ANTERIOR APPROACH;  Surgeon: Mcarthur Rossetti, MD;  Location: [facility];  Service: Orthopedics;  Laterality: Right;  . TOTAL KNEE ARTHROPLASTY Right 12/20/2018   Procedure: RIGHT TOTAL KNEE ARTHROPLASTY;  Surgeon: Mcarthur Rossetti, MD;  [facility]: [facility];  Service: Orthopedics;  Laterality: Right;    There were no vitals filed for this visit.  Subjective Assessment - 04/29/19 1151    Subjective  Her knee is doing good, able to go on 3 mile hike this weeekend, still having mild stiffness    Pertinent History  DXI:PJAS Rt THA now with Rt leg longer and wears lift in Lt shoe 04/21/16, anx,backpain,dep,HTN,osteopenia    Limitations  Standing;Walking    How long can you sit comfortably?  not limited    How long can you stand comfortably?  30  min    How long can you walk comfortably?  .25 mile    Patient Stated Goals  get my knee more ROM and get back to hiking    Currently in Pain?  No/denies    Pain Onset  More than a month ago                       [facility] Adult PT Treatment/Exercise - 04/29/19 0001      Knee/Hip Exercises: Stretches   Active Hamstring Stretch  Right;3 reps;30 seconds    Active Hamstring Stretch Limitations  standing    Quad Stretch  30 seconds    Quad Stretch Limitations  prone manual    Other Knee/Hip Stretches  kneeling stretch on mat table  for flexion 20 sec  x3    Other Knee/Hip Stretches  standing lunge stretch 10 sec X 10 for flexion      Knee/Hip Exercises: Aerobic   Recumbent Bike  L3X 7 min      Knee/Hip Exercises: Standing   Other Standing Knee Exercises  lunges in bars X 12 bilat with UE support    Other Standing Knee Exercises  up/down stairs with no handrails X 10, then lateral step ups  X 15 bilat      Knee/Hip Exercises: Sidelying   Other Sidelying Knee/Hip Exercises  SLR supine flexion and SL hip abd 2X 10 bilat      Manual Therapy   Manual therapy comments  PROM, manual quad strengthening, patella and knee flexion mobs, STM/IASTM to quads and H.S                  PT Long Term Goals - 04/11/19 1157      PT LONG TERM GOAL #1   Title  Pt will be I and compliant with HEP.    Status  Achieved      PT LONG TERM GOAL #2   Title  Pt will improve Rt knee ROM to 0-115  deg to improve functional abilities.    Baseline  met PROM not for AROM    Status  Partially Met      PT LONG TERM GOAL #3   Title  Pt will improve hip/knee strength to grossly 5/5 MMT tested in sitting.     Status  Partially Met      PT LONG TERM GOAL #4   Title  Pt will be able to ambulate communtiy distances and walk on uneven terrain with [facility] gait pattern and return to walking trails/hiking.     Status  Achieved      PT LONG TERM GOAL #5   Title  Pt will have less than 2/10 overall pain with usual activity.    Status  Achieved            Plan - 04/29/19 1153    Clinical Impression Statement  She is doing quite welll, only real defecit is minor stifnness. Will plan to DC next visit. Will finalize HEP for her    Comorbidities  EUM:PNTI Rt THA now with Rt leg longer and wears lift in Lt shoe 04/21/16, anx,backpain,dep,HTN,osteopenia    Rehab Potential  Good    PT Frequency  3x / week    PT Duration  6 weeks    PT Treatment/Interventions  Cryotherapy;Electrical Stimulation;Iontophoresis 62m/ml Dexamethasone;Moist Heat;Traction;Ultrasound;Gait training;Stair training;Therapeutic activities;Therapeutic exercise;Neuromuscular re-education;Passive range of motion;Dry needling;Manual techniques;Joint Manipulations;Taping    PT Next Visit Plan  knee ROM, hip and knee strength and endurance    PT Home Exercise Plan  [id number] , HSS, heel slides, knee flexion lunge stretch, kneeling flexion stretch, quad stretch, sit to stands, stand march, SLS,  SL hip abd & clams; fwd step down; hip hike    Consulted and Agree with Plan of Care  Patient       Patient will benefit from skilled therapeutic intervention in order to improve the following deficits and impairments:  Decreased activity tolerance, Decreased endurance, Decreased range of motion, Abnormal gait, Decreased strength, Hypomobility, Difficulty walking, Increased edema, Impaired flexibility, Increased fascial restricitons, Pain  Visit  Diagnosis: 1. Stiffness of right knee, not elsewhere classified   2. Difficulty in walking, not elsewhere classified   3. Acute pain of right knee  Problem List Patient Active Problem List   Diagnosis Date Noted  . 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 Chronic pain of left knee 09/06/2017  . Unilateral primary osteoarthritis, left knee 09/06/2017  . Chronic pain of right knee 09/06/2017  . Leg length difference, acquired 03/06/2017  . Low back pain 03/06/2017  . Unilateral primary osteoarthritis, right knee 12/06/2016  . 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 Degenerative arthritis of hip 04/19/2012  . Anxiety   . Cancer ([place])   . Depression   . Arthritis   . RENAL CALCULUS, RIGHT 12/15/2008  . ABDOMINAL PAIN, UNSPECIFIED SITE 12/15/2008  . NEPHROLITHIASIS, HX OF 12/15/2008  . ANXIETY DEPRESSION 10/29/2007  . CONTACT DERMATITIS DUE TO SOLAR RADIATION 10/29/2007  . OSTEOARTHROSIS, LOCAL, PRIMARY, [facility] SITE 08/16/2007    Silvestre Mesi 04/29/2019, 11:54 AM  [facility] [address] [place], [place], [address] Phone: [phone]   Fax:  [phone]  Name: Liliyana Thobe MRN: [id number] Date of Birth: [birth year]

## 2019-05-01 ENCOUNTER — Ambulatory Visit: Payer: Medicare HMO | Attending: Orthopaedic Surgery | Admitting: Physical Therapy

## 2019-05-01 ENCOUNTER — Other Ambulatory Visit: Payer: Self-pay

## 2019-05-01 DIAGNOSIS — M25661 Stiffness of right knee, not elsewhere classified: Secondary | ICD-10-CM | POA: Insufficient documentation

## 2019-05-01 DIAGNOSIS — M25561 Pain in right knee: Secondary | ICD-10-CM | POA: Insufficient documentation

## 2019-05-01 DIAGNOSIS — R262 Difficulty in walking, not elsewhere classified: Secondary | ICD-10-CM

## 2019-05-01 NOTE — Patient Instructions (Signed)
Access Code: [email or web address]  URL: [email or web address]  Date: 05/01/2019  Prepared by: Elsie Ra   Exercises  Supine Quadriceps Stretch with Strap on Table - 3 sets - 30 hold - 2x daily - 6x weekly  Prone Knee Extension Hang - 1 reps - 1 sets - 5 min hold - 2x daily - 6x weekly  Standing Hamstring Stretch on Chair - 3 reps - 1 sets - 30 hold - 2x daily - 6x weekly  Quadruped Knee Flexion Stretch - 3 reps - 1 sets - 30 hold - 2x daily - 6x weekly  Supine Active Straight Leg Raise - 10 reps - 1-3 sets - 2x daily - 6x weekly  Sidelying Hip Abduction - 10 reps - 1-3 sets - 2x daily - 6x weekly  Sit to Stand without Arm Support - 10 reps - 1-2 sets - 2x daily - 6x weekly  Lunge with Counter Support - 10 reps - 3 sets - 2x daily - 6x weekly

## 2019-05-01 NOTE — Therapy (Signed)
[place], [place], [address] Phone: [phone]   Fax:  [phone]  Physical Therapy Treatment/REcert  Patient Details  Name: [name] MRN: [id number] Date of Birth: 30-Jan-1941 Referring Provider (PT): Mcarthur Rossetti, MD   Encounter Date: 05/01/2019  PT End of Session - 05/01/19 1023    Visit Number  36    Number of Visits  38    Date for PT Re-Evaluation  05/09/19    Authorization Type  needs KX    PT Start Time  0935    PT Stop Time  1019    PT Time Calculation (min)  44 min    Activity Tolerance  Patient tolerated treatment well       Past Medical History:  Diagnosis Date  . Anxiety   . Arthritis    lower back, hip  . Back pain    generally "causes severe stiffness"  . Complication of anesthesia    "felt it made her feel very goofy for several monthS with general anesthetic"  . Depression   . GERD (gastroesophageal reflux disease)   . History of kidney stones 1988   x1 episode passed on own  . Hypertension   . 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 Osteopenia   . 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 APPENDECTOMY  2010   open  . BREAST LUMPECTOMY Left 1961   benign tumor  . TOTAL HIP ARTHROPLASTY  04/19/2012   Procedure: TOTAL HIP ARTHROPLASTY ANTERIOR APPROACH;  Surgeon: Mcarthur Rossetti, MD;  Location: [facility];  Service: Orthopedics;  Laterality: Left;  Left Total Hip Arthroplasty  . TOTAL HIP ARTHROPLASTY Right 04/21/2016   Procedure: RIGHT TOTAL HIP ARTHROPLASTY ANTERIOR APPROACH;  Surgeon: Mcarthur Rossetti, MD;  Location: [facility];  Service: Orthopedics;  Laterality: Right;  . TOTAL KNEE ARTHROPLASTY Right 12/20/2018   Procedure: RIGHT TOTAL KNEE ARTHROPLASTY;  Surgeon: Mcarthur Rossetti, MD;  Location: [facility];  Service: Orthopedics;  Laterality: Right;    There were no vitals filed for this  visit.  Subjective Assessment - 05/01/19 1020    Subjective  her knee is doing good, no complaints other than some stiffness    Pertinent History  EPP:IRJJ Rt THA now with Rt leg longer and wears lift in Lt shoe 04/21/16, anx,backpain,dep,HTN,osteopenia    Limitations  Standing;Walking    How long can you sit comfortably?  not limited    How long can you stand comfortably?  30  min    How long can you walk comfortably?  .25 mile    Patient Stated Goals  get my knee more ROM and get back to hiking    Currently in Pain?  No/denies    Pain Onset  More than a month ago         [facility] PT Assessment - 05/01/19 0001      Assessment   Medical Diagnosis  Rt TKA    Referring Provider (PT)  Mcarthur Rossetti, MD      AROM   Right Knee Extension  -2    Right Knee Flexion  120      PROM   Right Knee Flexion  125                   [facility] Adult PT Treatment/Exercise - 05/01/19 0001      Knee/Hip Exercises: Stretches   Active  Hamstring Stretch  Right;3 reps;30 seconds    Active Hamstring Stretch Limitations  standing    Quad Stretch  30 seconds    Quad Stretch Limitations  supine with strap    Other Knee/Hip Stretches  kneeling stretch on mat table for flexion 30 sec  x3    Other Knee/Hip Stretches  prone hang 3 min with STM      Knee/Hip Exercises: Aerobic   Recumbent Bike  L3X 7 min      Knee/Hip Exercises: Standing   Other Standing Knee Exercises  lunges at counter bilat with  one UE support X10      Knee/Hip Exercises: Seated   Sit to Sand  20 reps;without UE support      Knee/Hip Exercises: Supine   Straight Leg Raises  Strengthening;Right;Left;20 reps      Knee/Hip Exercises: Sidelying   Hip ABduction  Both;20 reps      Manual Therapy   Manual therapy comments  PROM, manual quad strengthening, patella and knee flexion mobs, STM/IASTM to quads and H.S                  PT Long Term Goals - 04/11/19 1157      PT LONG TERM GOAL #1   Title  Pt  will be I and compliant with HEP.    Status  Achieved      PT LONG TERM GOAL #2   Title  Pt will improve Rt knee ROM to 0-115 deg to improve functional abilities.    Baseline  met PROM not for AROM    Status  Partially Met      PT LONG TERM GOAL #3   Title  Pt will improve hip/knee strength to grossly 5/5 MMT tested in sitting.     Status  Partially Met      PT LONG TERM GOAL #4   Title  Pt will be able to ambulate communtiy distances and walk on uneven terrain with [facility] gait pattern and return to walking trails/hiking.     Status  Achieved      PT LONG TERM GOAL #5   Title  Pt will have less than 2/10 overall pain with usual activity.    Status  Achieved            Plan - 05/01/19 1024    Clinical Impression Statement  HEP updated and revised for her to focus on the 8 most important exercises for stretching and strengthening for her knee. Her knee is doing quite well, only missing some extenson ROM. She has 2 more visits scheduled and PT anticipates discharge then. REcert submitted for one more week of PT for 2 more visits.    Comorbidities  NUU:VOZD Rt THA now with Rt leg longer and wears lift in Lt shoe 04/21/16, anx,backpain,dep,HTN,osteopenia    Rehab Potential  Good    PT Frequency  3x / week    PT Duration  6 weeks    PT Treatment/Interventions  Cryotherapy;Electrical Stimulation;Iontophoresis 51m/ml Dexamethasone;Moist Heat;Traction;Ultrasound;Gait training;Stair training;Therapeutic activities;Therapeutic exercise;Neuromuscular re-education;Passive range of motion;Dry needling;Manual techniques;Joint Manipulations;Taping    PT Next Visit Plan  knee ROM, hip and knee strength and endurance    PT Home Exercise Plan  [id number] , HSS, heel slides, knee flexion lunge stretch, kneeling flexion stretch, quad stretch, sit to stands, stand march, SLS,  SL hip abd & clams; fwd step down; hip hike    Consulted and Agree with Plan of Care  Patient  Patient will benefit from  skilled therapeutic intervention in order to improve the following deficits and impairments:  Decreased activity tolerance, Decreased endurance, Decreased range of motion, Abnormal gait, Decreased strength, Hypomobility, Difficulty walking, Increased edema, Impaired flexibility, Increased fascial restricitons, Pain  Visit Diagnosis: 1. Stiffness of right knee, not elsewhere classified   2. Difficulty in walking, not elsewhere classified   3. Acute pain of right knee        Problem List Patient Active Problem List   Diagnosis Date Noted  . 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 Chronic pain of left knee 09/06/2017  . Unilateral primary osteoarthritis, left knee 09/06/2017  . Chronic pain of right knee 09/06/2017  . Leg length difference, acquired 03/06/2017  . Low back pain 03/06/2017  . Unilateral primary osteoarthritis, right knee 12/06/2016  . 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 Degenerative arthritis of hip 04/19/2012  . Anxiety   . Cancer ([place])   . Depression   . Arthritis   . RENAL CALCULUS, RIGHT 12/15/2008  . ABDOMINAL PAIN, UNSPECIFIED SITE 12/15/2008  . NEPHROLITHIASIS, HX OF 12/15/2008  . ANXIETY DEPRESSION 10/29/2007  . CONTACT DERMATITIS DUE TO SOLAR RADIATION 10/29/2007  . OSTEOARTHROSIS, LOCAL, PRIMARY, [facility] SITE 08/16/2007    Silvestre Mesi 05/01/2019, 10:27 AM  [facility] [address] [place], [place], [address] Phone: [phone]   Fax:  [phone]  Name: [name] MRN: [id number] Date of Birth: 1940-12-11

## 2019-05-05 ENCOUNTER — Telehealth: Payer: Self-pay | Admitting: Orthopaedic Surgery

## 2019-05-05 NOTE — Telephone Encounter (Signed)
Patient having dental work done 05/12/2019, and would like top know if she needs an antibiotic. Patient uses [facility] [address]/ [address].

## 2019-05-05 NOTE — Telephone Encounter (Signed)
Patient aware she no longer needs pre med anymore

## 2019-05-06 ENCOUNTER — Encounter: Payer: Self-pay | Admitting: Physical Therapy

## 2019-05-06 ENCOUNTER — Ambulatory Visit: Payer: Medicare HMO | Admitting: Physical Therapy

## 2019-05-06 ENCOUNTER — Other Ambulatory Visit: Payer: Self-pay

## 2019-05-06 DIAGNOSIS — R262 Difficulty in walking, not elsewhere classified: Secondary | ICD-10-CM | POA: Diagnosis not present

## 2019-05-06 DIAGNOSIS — M25661 Stiffness of right knee, not elsewhere classified: Secondary | ICD-10-CM | POA: Diagnosis not present

## 2019-05-06 DIAGNOSIS — M25561 Pain in right knee: Secondary | ICD-10-CM

## 2019-05-06 NOTE — Therapy (Signed)
[place], [place], [address] Phone: [phone]   Fax:  [phone]  Physical Therapy Treatment  Patient Details  Name: [name] MRN: [id number] Date of Birth: May 13, 1941 Referring Provider (PT): Mcarthur Rossetti, MD   Encounter Date: 05/06/2019  PT End of Session - 05/06/19 1015    Visit Number  37    Number of Visits  38    Date for PT Re-Evaluation  05/09/19    Authorization Type  needs KX    PT Start Time  0930    PT Stop Time  1015    PT Time Calculation (min)  45 min    Activity Tolerance  Patient tolerated treatment well       Past Medical History:  Diagnosis Date  . Anxiety   . Arthritis    lower back, hip  . Back pain    generally "causes severe stiffness"  . Complication of anesthesia    "felt it made her feel very goofy for several monthS with general anesthetic"  . Depression   . GERD (gastroesophageal reflux disease)   . History of kidney stones 1988   x1 episode passed on own  . Hypertension   . 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 Osteopenia   . 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 APPENDECTOMY  2010   open  . BREAST LUMPECTOMY Left 1961   benign tumor  . TOTAL HIP ARTHROPLASTY  04/19/2012   Procedure: TOTAL HIP ARTHROPLASTY ANTERIOR APPROACH;  Surgeon: Mcarthur Rossetti, MD;  [facility]: [facility];  Service: Orthopedics;  Laterality: Left;  Left Total Hip Arthroplasty  . TOTAL HIP ARTHROPLASTY Right 04/21/2016   Procedure: RIGHT TOTAL HIP ARTHROPLASTY ANTERIOR APPROACH;  Surgeon: Mcarthur Rossetti, MD;  [facility]: [facility];  Service: Orthopedics;  Laterality: Right;  . TOTAL KNEE ARTHROPLASTY Right 12/20/2018   Procedure: RIGHT TOTAL KNEE ARTHROPLASTY;  Surgeon: Mcarthur Rossetti, MD;  [facility]: [facility];  Service: Orthopedics;  Laterality: Right;    There were no vitals filed for this  visit.  Subjective Assessment - 05/06/19 1007    Subjective  knee is a little stiff, has not had a chance to stretch this morning    Pertinent History  ITG:PQDI Rt THA now with Rt leg longer and wears lift in Lt shoe 04/21/16, anx,backpain,dep,HTN,osteopenia    Limitations  Standing;Walking    How long can you sit comfortably?  not limited    How long can you stand comfortably?  30  min    How long can you walk comfortably?  .25 mile    Patient Stated Goals  get my knee more ROM and get back to hiking    Currently in Pain?  No/denies    Pain Onset  More than a month ago                       [facility] Adult PT Treatment/Exercise - 05/06/19 0001      Knee/Hip Exercises: Stretches   Active Hamstring Stretch  Right;3 reps;30 seconds    Active Hamstring Stretch Limitations  standing    Quad Stretch  30 seconds    Quad Stretch Limitations  supine with strap    Other Knee/Hip Stretches  kneeling stretch on mat table for flexion 30 sec  x3    Other Knee/Hip Stretches  prone hang 3 min with STM  Knee/Hip Exercises: Aerobic   Recumbent Bike  L3X 7 min      Knee/Hip Exercises: Machines for Strengthening   Cybex Leg Press  omega leg press 35 lbs  x25 reps pushing bilat      Knee/Hip Exercises: Standing   Other Standing Knee Exercises  lunges at counter bilat with  one UE support X10    Other Standing Knee Exercises  up/down stairs no handrails X 5      Knee/Hip Exercises: Supine   Straight Leg Raises  Strengthening;Right;Left;20 reps      Knee/Hip Exercises: Sidelying   Hip ABduction  Both;20 reps      Manual Therapy   Manual therapy comments  PROM, manual quad strengthening, patella and knee flexion mobs, STM/IASTM to quads and H.S                  PT Long Term Goals - 04/11/19 1157      PT LONG TERM GOAL #1   Title  Pt will be I and compliant with HEP.    Status  Achieved      PT LONG TERM GOAL #2   Title  Pt will improve Rt knee ROM to 0-115 deg  to improve functional abilities.    Baseline  met PROM not for AROM    Status  Partially Met      PT LONG TERM GOAL #3   Title  Pt will improve hip/knee strength to grossly 5/5 MMT tested in sitting.     Status  Partially Met      PT LONG TERM GOAL #4   Title  Pt will be able to ambulate communtiy distances and walk on uneven terrain with [facility] gait pattern and return to walking trails/hiking.     Status  Achieved      PT LONG TERM GOAL #5   Title  Pt will have less than 2/10 overall pain with usual activity.    Status  Achieved            Plan - 05/06/19 1016    Clinical Impression Statement  She has made great progress, only deficit is some mild knee stiffness.  PT only anticpates one more visit then discharge.    Comorbidities  TKW:IOXB Rt THA now with Rt leg longer and wears lift in Lt shoe 04/21/16, anx,backpain,dep,HTN,osteopenia    Rehab Potential  Good    PT Frequency  3x / week    PT Duration  6 weeks    PT Treatment/Interventions  Cryotherapy;Electrical Stimulation;Iontophoresis 32m/ml Dexamethasone;Moist Heat;Traction;Ultrasound;Gait training;Stair training;Therapeutic activities;Therapeutic exercise;Neuromuscular re-education;Passive range of motion;Dry needling;Manual techniques;Joint Manipulations;Taping    PT Next Visit Plan  knee ROM, hip and knee strength and endurance    PT Home Exercise Plan  [id number] , HSS, heel slides, knee flexion lunge stretch, kneeling flexion stretch, quad stretch, sit to stands, stand march, SLS,  SL hip abd & clams; fwd step down; hip hike    Consulted and Agree with Plan of Care  Patient       Patient will benefit from skilled therapeutic intervention in order to improve the following deficits and impairments:  Decreased activity tolerance, Decreased endurance, Decreased range of motion, Abnormal gait, Decreased strength, Hypomobility, Difficulty walking, Increased edema, Impaired flexibility, Increased fascial restricitons, Pain  Visit  Diagnosis: 1. Stiffness of right knee, not elsewhere classified   2. Difficulty in walking, not elsewhere classified   3. Acute pain of right knee        Problem  List Patient Active Problem List   Diagnosis Date Noted  . 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 Chronic pain of left knee 09/06/2017  . Unilateral primary osteoarthritis, left knee 09/06/2017  . Chronic pain of right knee 09/06/2017  . Leg length difference, acquired 03/06/2017  . Low back pain 03/06/2017  . Unilateral primary osteoarthritis, right knee 12/06/2016  . 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 Degenerative arthritis of hip 04/19/2012  . Anxiety   . Cancer ([place])   . Depression   . Arthritis   . RENAL CALCULUS, RIGHT 12/15/2008  . ABDOMINAL PAIN, UNSPECIFIED SITE 12/15/2008  . NEPHROLITHIASIS, HX OF 12/15/2008  . ANXIETY DEPRESSION 10/29/2007  . CONTACT DERMATITIS DUE TO SOLAR RADIATION 10/29/2007  . OSTEOARTHROSIS, LOCAL, PRIMARY, [facility] SITE 08/16/2007    Silvestre Mesi 05/06/2019, 10:18 AM  [facility] [address] [place], [place], [address] Phone: [phone]   Fax:  [phone]  Name: [name] MRN: [id number] Date of Birth: [birth year]

## 2019-05-08 ENCOUNTER — Ambulatory Visit: Payer: Medicare HMO | Admitting: Physical Therapy

## 2019-05-12 ENCOUNTER — Ambulatory Visit: Payer: Medicare HMO | Admitting: Physical Therapy

## 2019-05-12 DIAGNOSIS — R69 Illness, unspecified: Secondary | ICD-10-CM | POA: Diagnosis not present

## 2019-05-12 DIAGNOSIS — L578 Other skin changes due to chronic exposure to nonionizing radiation: Secondary | ICD-10-CM | POA: Diagnosis not present

## 2019-05-12 DIAGNOSIS — L738 Other specified follicular disorders: Secondary | ICD-10-CM | POA: Diagnosis not present

## 2019-05-12 DIAGNOSIS — L821 Other seborrheic keratosis: Secondary | ICD-10-CM | POA: Diagnosis not present

## 2019-05-12 DIAGNOSIS — Z85828 Personal history of other malignant neoplasm of skin: Secondary | ICD-10-CM | POA: Diagnosis not present

## 2019-05-19 ENCOUNTER — Ambulatory Visit: Payer: Medicare HMO | Admitting: Physical Therapy

## 2019-05-19 ENCOUNTER — Other Ambulatory Visit: Payer: Self-pay

## 2019-05-19 DIAGNOSIS — M25661 Stiffness of right knee, not elsewhere classified: Secondary | ICD-10-CM | POA: Diagnosis not present

## 2019-05-19 DIAGNOSIS — M25561 Pain in right knee: Secondary | ICD-10-CM

## 2019-05-19 DIAGNOSIS — R262 Difficulty in walking, not elsewhere classified: Secondary | ICD-10-CM | POA: Diagnosis not present

## 2019-05-20 NOTE — Therapy (Signed)
[place], [place], [address] Phone: [phone]   Fax:  [phone]  Physical Therapy Treatment/Discharge  PHYSICAL THERAPY DISCHARGE SUMMARY  Visits from Start of Care: 97  Current functional level related to goals / functional outcomes: See below   Remaining deficits: none  Education / Equipment: HEP Plan: Patient agrees to discharge.  Patient goals were met. Patient is being discharged due to meeting the stated rehab goals.  ?????       Patient Details  Name: [name] MRN: [id number] Date of Birth: [birth year] Referring Provider (PT): Mcarthur Rossetti, MD   Encounter Date: 05/19/2019  PT End of Session - 05/20/19 0115    Visit Number  38    Number of Visits  38    Date for PT Re-Evaluation  05/09/19    Authorization Type  needs KX    PT Start Time  7782    PT Stop Time  1800    PT Time Calculation (min)  45 min    Activity Tolerance  Patient tolerated treatment well       Past Medical History:  Diagnosis Date  . Anxiety   . Arthritis    lower back, hip  . Back pain    generally "causes severe stiffness"  . Complication of anesthesia    "felt it made her feel very goofy for several monthS with general anesthetic"  . Depression   . GERD (gastroesophageal reflux disease)   . History of kidney stones 1988   x1 episode passed on own  . Hypertension   . 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 Osteopenia   . 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 APPENDECTOMY  2010   open  . BREAST LUMPECTOMY Left 1961   benign tumor  . TOTAL HIP ARTHROPLASTY  04/19/2012   Procedure: TOTAL HIP ARTHROPLASTY ANTERIOR APPROACH;  Surgeon: Mcarthur Rossetti, MD;  [facility]: [facility];  Service: Orthopedics;  Laterality: Left;  Left Total Hip Arthroplasty  . TOTAL HIP ARTHROPLASTY Right 04/21/2016   Procedure: RIGHT TOTAL HIP  ARTHROPLASTY ANTERIOR APPROACH;  Surgeon: Mcarthur Rossetti, MD;  Location: [facility];  Service: Orthopedics;  Laterality: Right;  . TOTAL KNEE ARTHROPLASTY Right 12/20/2018   Procedure: RIGHT TOTAL KNEE ARTHROPLASTY;  Surgeon: Mcarthur Rossetti, MD;  [facility]: [facility];  Service: Orthopedics;  Laterality: Right;    There were no vitals filed for this visit.  Subjective Assessment - 05/20/19 0114    Subjective  My knee feels great, feels ready for discharge from PT    Pertinent History  UMP:NTIR Rt THA now with Rt leg longer and wears lift in Lt shoe 04/21/16, anx,backpain,dep,HTN,osteopenia    Limitations  Standing;Walking    How long can you sit comfortably?  not limited    How long can you stand comfortably?  30  min    How long can you walk comfortably?  .25 mile    Patient Stated Goals  get my knee more ROM and get back to hiking    Currently in Pain?  No/denies    Pain Onset  More than a month ago         [facility] PT Assessment - 05/20/19 0001      Assessment   Medical Diagnosis  Rt TKA    Referring Provider (PT)  Mcarthur Rossetti, MD      Observation/Other Assessments  Focus on Therapeutic Outcomes (FOTO)   11% limited, at eval she was 49% limited      AROM   Right Knee Extension  0    Right Knee Flexion  120      PROM   Right Knee Flexion  130                   [facility] Adult PT Treatment/Exercise - 05/20/19 0001      Knee/Hip Exercises: Stretches   Active Hamstring Stretch  Right;3 reps;30 seconds    Active Hamstring Stretch Limitations  sitting    Quad Stretch  30 seconds    Quad Stretch Limitations  supine with strap    Other Knee/Hip Stretches  kneeling stretch on mat table for flexion 30 sec  x3    Other Knee/Hip Stretches  prone hang 3 min with STM      Knee/Hip Exercises: Aerobic   Recumbent Bike  L3X 7 min      Manual Therapy   Manual therapy comments  PROM, manual quad strengthening, patella and knee flexion mobs, STM/IASTM to  quads and H.S                  PT Long Term Goals - 05/20/19 0117      PT LONG TERM GOAL #1   Title  Pt will be I and compliant with HEP.    Status  Achieved      PT LONG TERM GOAL #2   Title  Pt will improve Rt knee ROM to 0-115 deg to improve functional abilities.    Status  Achieved      PT LONG TERM GOAL #3   Title  Pt will improve hip/knee strength to grossly 5/5 MMT tested in sitting.     Status  Achieved      PT LONG TERM GOAL #4   Title  Pt will be able to ambulate communtiy distances and walk on uneven terrain with [facility] gait pattern and return to walking trails/hiking.     Status  Achieved      PT LONG TERM GOAL #5   Title  Pt will have less than 2/10 overall pain with usual activity.    Status  Achieved            Plan - 05/20/19 0115    Clinical Impression Statement  RE performed today as POC has ended. She has made excellent progress with PT and now has good ROM and knee strength. She has no pain and has now met all PT goals. She will be discharged to HEP and she is in agreement. She has joined [employer] and will continue to maintain her strength and ROM in the pool.    Comorbidities  QHU:TMLY Rt THA now with Rt leg longer and wears lift in Lt shoe 04/21/16, anx,backpain,dep,HTN,osteopenia    Rehab Potential  Good    PT Frequency  3x / week    PT Duration  6 weeks    PT Treatment/Interventions  Cryotherapy;Electrical Stimulation;Iontophoresis 5m/ml Dexamethasone;Moist Heat;Traction;Ultrasound;Gait training;Stair training;Therapeutic activities;Therapeutic exercise;Neuromuscular re-education;Passive range of motion;Dry needling;Manual techniques;Joint Manipulations;Taping    PT Next Visit Plan  knee ROM, hip and knee strength and endurance    PT Home Exercise Plan  [id number] , HSS, heel slides, knee flexion lunge stretch, kneeling flexion stretch, quad stretch, sit to stands, stand march, SLS,  SL hip abd & clams; fwd step down; hip hike     Consulted and Agree with Plan  of Care  Patient       Patient will benefit from skilled therapeutic intervention in order to improve the following deficits and impairments:  Decreased activity tolerance, Decreased endurance, Decreased range of motion, Abnormal gait, Decreased strength, Hypomobility, Difficulty walking, Increased edema, Impaired flexibility, Increased fascial restricitons, Pain  Visit Diagnosis: 1. Stiffness of right knee, not elsewhere classified   2. Difficulty in walking, not elsewhere classified   3. Acute pain of right knee        Problem List Patient Active Problem List   Diagnosis Date Noted  . 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 Chronic pain of left knee 09/06/2017  . Unilateral primary osteoarthritis, left knee 09/06/2017  . Chronic pain of right knee 09/06/2017  . Leg length difference, acquired 03/06/2017  . Low back pain 03/06/2017  . Unilateral primary osteoarthritis, right knee 12/06/2016  . 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 Degenerative arthritis of hip 04/19/2012  . Anxiety   . Cancer ([place])   . Depression   . Arthritis   . RENAL CALCULUS, RIGHT 12/15/2008  . ABDOMINAL PAIN, UNSPECIFIED SITE 12/15/2008  . NEPHROLITHIASIS, HX OF 12/15/2008  . ANXIETY DEPRESSION 10/29/2007  . CONTACT DERMATITIS DUE TO SOLAR RADIATION 10/29/2007  . OSTEOARTHROSIS, LOCAL, PRIMARY, [facility] SITE 08/16/2007    Silvestre Mesi 05/20/2019, 1:23 AM  [facility] [address] [place], [place], [address] Phone: [phone]   Fax:  [phone]  Name: Almeta Geisel MRN: [id number] Date of Birth: 06-01-1941

## 2019-07-08 ENCOUNTER — Ambulatory Visit: Payer: PRIVATE HEALTH INSURANCE | Admitting: Orthopaedic Surgery

## 2019-07-08 DIAGNOSIS — H5213 Myopia, bilateral: Secondary | ICD-10-CM | POA: Diagnosis not present

## 2019-07-08 DIAGNOSIS — H2513 Age-related nuclear cataract, bilateral: Secondary | ICD-10-CM | POA: Diagnosis not present

## 2019-07-15 ENCOUNTER — Encounter: Payer: Self-pay | Admitting: Orthopaedic Surgery

## 2019-07-15 ENCOUNTER — Other Ambulatory Visit: Payer: Self-pay

## 2019-07-15 ENCOUNTER — Ambulatory Visit (INDEPENDENT_AMBULATORY_CARE_PROVIDER_SITE_OTHER): Payer: Medicare HMO | Admitting: Orthopaedic Surgery

## 2019-07-15 DIAGNOSIS — Z96651 Presence of right artificial knee joint: Secondary | ICD-10-CM | POA: Diagnosis not present

## 2019-07-15 NOTE — Progress Notes (Signed)
HPI: [name] returns the exam status post right total knee arthroplasty.  She states she still has some stiffness and some rare discomfort but otherwise doing great.  She continues to work on range of motion strengthening she feels that her right knee is definitely better pain at this point in time.  Review of systems: No fevers chills shortness of breath or chest pain.  Physical exam: Right knee full extension flexion 210 115 degrees.  No instability valgus varus stressing.  Surgical incisions well-healed no signs of infection calf supple nontender  Impression: 7 months status post right total knee arthroplasty  Plan: She will continue work on range of motion strengthening the right knee.  She will follow-up at 1 year postop at that time we will obtain AP and lateral views of the right knee.  Questions encouraged and answered.

## 2019-08-06 DIAGNOSIS — Z23 Encounter for immunization: Secondary | ICD-10-CM | POA: Diagnosis not present

## 2019-09-11 ENCOUNTER — Other Ambulatory Visit: Payer: Self-pay | Admitting: Orthopaedic Surgery

## 2019-09-11 ENCOUNTER — Encounter: Payer: Self-pay | Admitting: Orthopaedic Surgery

## 2019-09-11 MED ORDER — DICLOFENAC SODIUM 75 MG PO TBEC: 75.0000 mg | DELAYED_RELEASE_TABLET | Freq: Two times a day (BID) | ORAL | 3 refills | Status: DC | PRN

## 2019-09-12 DIAGNOSIS — D0471 Carcinoma in situ of skin of right lower limb, including hip: Secondary | ICD-10-CM | POA: Diagnosis not present

## 2019-09-12 DIAGNOSIS — L570 Actinic keratosis: Secondary | ICD-10-CM | POA: Diagnosis not present

## 2019-09-12 DIAGNOSIS — L578 Other skin changes due to chronic exposure to nonionizing radiation: Secondary | ICD-10-CM | POA: Diagnosis not present

## 2019-09-12 DIAGNOSIS — C44722 Squamous cell carcinoma of skin of right lower limb, including hip: Secondary | ICD-10-CM | POA: Diagnosis not present

## 2019-09-12 DIAGNOSIS — D2262 Melanocytic nevi of left upper limb, including shoulder: Secondary | ICD-10-CM | POA: Diagnosis not present

## 2019-09-12 DIAGNOSIS — D485 Neoplasm of uncertain behavior of skin: Secondary | ICD-10-CM | POA: Diagnosis not present

## 2019-09-12 DIAGNOSIS — L814 Other melanin hyperpigmentation: Secondary | ICD-10-CM | POA: Diagnosis not present

## 2019-09-17 ENCOUNTER — Other Ambulatory Visit: Payer: Self-pay | Admitting: Geriatric Medicine

## 2019-09-17 ENCOUNTER — Ambulatory Visit
Admission: RE | Admit: 2019-09-17 | Discharge: 2019-09-17 | Disposition: A | Discharge status: Home / Self Care | Payer: Medicare HMO | Source: Ambulatory Visit | Attending: Geriatric Medicine | Admitting: Geriatric Medicine

## 2019-09-17 DIAGNOSIS — M542 Cervicalgia: Secondary | ICD-10-CM | POA: Diagnosis not present

## 2019-09-17 DIAGNOSIS — Z79899 Other long term (current) drug therapy: Secondary | ICD-10-CM | POA: Diagnosis not present

## 2019-09-17 DIAGNOSIS — I10 Essential (primary) hypertension: Secondary | ICD-10-CM | POA: Diagnosis not present

## 2019-11-11 ENCOUNTER — Ambulatory Visit: Payer: Medicare Other | Attending: Internal Medicine

## 2019-11-11 DIAGNOSIS — Z23 Encounter for immunization: Secondary | ICD-10-CM | POA: Insufficient documentation

## 2019-11-11 NOTE — Progress Notes (Signed)
   Covid-19 Vaccination Clinic  Name:  [name]    MRN: [id number] DOB: 1941-10-19  11/11/2019  [name] was observed post Covid-19 immunization for 15 minutes without incidence. She was provided with Vaccine Information Sheet and instruction to access the V-Safe system.   [name] was instructed to call 911 with any severe reactions post vaccine: Marland Kitchen Difficulty breathing  . Swelling of your face and throat  . A fast heartbeat  . A bad rash all over your body  . Dizziness and weakness    Immunizations Administered    Name Date Dose VIS Date Route   Pfizer COVID-19 Vaccine 11/11/2019  9:09 AM 0.3 mL 10/10/2019 Intramuscular   Manufacturer: [place]   Lot: [id number]   [place]: [id number]

## 2019-11-18 DIAGNOSIS — E785 Hyperlipidemia, unspecified: Secondary | ICD-10-CM | POA: Diagnosis not present

## 2019-11-18 DIAGNOSIS — R69 Illness, unspecified: Secondary | ICD-10-CM | POA: Diagnosis not present

## 2019-11-18 DIAGNOSIS — I2510 Atherosclerotic heart disease of native coronary artery without angina pectoris: Secondary | ICD-10-CM | POA: Diagnosis not present

## 2019-12-01 ENCOUNTER — Ambulatory Visit: Payer: Medicare HMO | Attending: Internal Medicine

## 2019-12-01 DIAGNOSIS — Z23 Encounter for immunization: Secondary | ICD-10-CM

## 2019-12-01 NOTE — Progress Notes (Signed)
   Covid-19 Vaccination Clinic  Name:  Yohanna Roome    MRN: [id number] DOB: 02/19/1941  12/01/2019  Ms. Nest was observed post Covid-19 immunization for 15 minutes without incidence. She was provided with Vaccine Information Sheet and instruction to access the V-Safe system.   Ms. Dubinski was instructed to call 911 with any severe reactions post vaccine: Marland Kitchen Difficulty breathing  . Swelling of your face and throat  . A fast heartbeat  . A bad rash all over your body  . Dizziness and weakness    Immunizations Administered    Name Date Dose VIS Date Route   Pfizer COVID-19 Vaccine 12/01/2019  8:58 AM 0.3 mL 10/10/2019 Intramuscular   Manufacturer: [place]   Lot: [id number]   [place]: [id number]

## 2019-12-12 ENCOUNTER — Other Ambulatory Visit: Payer: Self-pay | Admitting: Geriatric Medicine

## 2019-12-12 DIAGNOSIS — Z1231 Encounter for screening mammogram for malignant neoplasm of breast: Secondary | ICD-10-CM

## 2019-12-15 ENCOUNTER — Ambulatory Visit (INDEPENDENT_AMBULATORY_CARE_PROVIDER_SITE_OTHER): Payer: Medicare HMO | Admitting: Orthopaedic Surgery

## 2019-12-15 ENCOUNTER — Ambulatory Visit (INDEPENDENT_AMBULATORY_CARE_PROVIDER_SITE_OTHER): Payer: Medicare HMO

## 2019-12-15 ENCOUNTER — Other Ambulatory Visit: Payer: Self-pay

## 2019-12-15 ENCOUNTER — Encounter: Payer: Self-pay | Admitting: Orthopaedic Surgery

## 2019-12-15 DIAGNOSIS — Z96651 Presence of right artificial knee joint: Secondary | ICD-10-CM

## 2019-12-15 NOTE — Progress Notes (Signed)
Office Visit Note   Patient: [name]           Date of Birth: [birth year]           MRN: [id number] Visit Date: 12/15/2019              Requested by: Lajean Manes, MD [address] [place],  [place] [address] PCP: Lajean Manes, MD   Assessment & Plan: Visit Diagnoses:  1. Status post total right knee replacement     Plan: At this point follow-up can be as needed since she is doing well.  We had a long and thorough discussion about her knee as well as her previous right hip replacement.  We talked about arthritis in her hands.  I have recommended Voltaren gel as a topical over-the-counter anti-inflammatory.  All question concerns were answered and addressed.  Follow-up can be as needed.  Follow-Up Instructions: Return if symptoms worsen or fail to improve.   Orders:  Orders Placed This Encounter  Procedures  . XR Knee 1-2 Views Right   No orders of the defined types were placed in this encounter.     Procedures: No procedures performed   Clinical Data: No additional findings.   Subjective: Chief Complaint  Patient presents with  . Right Knee - Follow-up  The patient is a very pleasant 79 year old female who is almost a year out from a right total knee arthroplasty.  She says the right knee is doing well and she has no significant issues.  HPI  Review of Systems She currently denies any headache, chest pain, shortness of breath, fever, chills, nausea, vomiting  Objective: Vital Signs: LMP 10/31/1991   Physical Exam She is alert and orient x3 and in no acute distress Ortho Exam Examination of her right knee shows full range of motion.  It is ligamentously stable as well. Specialty Comments:  No specialty comments available.  Imaging: XR Knee 1-2 Views Right  Result Date: 12/15/2019 2 views of the right knee show well-seated total knee arthroplasty with no complicating features.    PMFS History: Patient Active Problem List   Diagnosis Date Noted  . 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 Chronic pain of left knee 09/06/2017  . Unilateral primary osteoarthritis, left knee 09/06/2017  . Chronic pain of right knee 09/06/2017  . Leg length difference, acquired 03/06/2017  . Low back pain 03/06/2017  . Unilateral primary osteoarthritis, right knee 12/06/2016  . 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 Degenerative arthritis of hip 04/19/2012  . Anxiety   . Cancer ([place])   . Depression   . Arthritis   . RENAL CALCULUS, RIGHT 12/15/2008  . ABDOMINAL PAIN, UNSPECIFIED SITE 12/15/2008  . NEPHROLITHIASIS, HX OF 12/15/2008  . ANXIETY DEPRESSION 10/29/2007  . CONTACT DERMATITIS DUE TO SOLAR RADIATION 10/29/2007  . OSTEOARTHROSIS, LOCAL, PRIMARY, UNSPC SITE 08/16/2007   Past Medical History:  Diagnosis Date  . Anxiety   . Arthritis    lower back, hip  . Back pain    generally "causes severe stiffness"  . Complication of anesthesia    "felt it made her feel very goofy for several monthS with general anesthetic"  . Depression   . GERD (gastroesophageal reflux disease)   . History of kidney stones 1988   x1 episode passed on own  . Hypertension   . 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 Osteopenia   . Skin cancer 1990   squamous/basil cell  Family History  Problem Relation Age of Onset  . Cancer Father        prostate  . Hypertension Father   . Heart attack Father   . Cancer Brother        Occupational psychologist  . Cancer Maternal Grandmother        throat  . Breast cancer Paternal Grandmother     Past Surgical History:  Procedure Laterality Date  . APPENDECTOMY  2010   open  . BREAST LUMPECTOMY Left 1961   benign tumor  . TOTAL HIP ARTHROPLASTY  04/19/2012   Procedure: TOTAL HIP ARTHROPLASTY ANTERIOR APPROACH;  Surgeon: Mcarthur Rossetti, MD;  Location: [facility];  Service: Orthopedics;  Laterality: Left;  Left Total Hip Arthroplasty  . TOTAL HIP ARTHROPLASTY  Right 04/21/2016   Procedure: RIGHT TOTAL HIP ARTHROPLASTY ANTERIOR APPROACH;  Surgeon: Mcarthur Rossetti, MD;  Location: [facility];  Service: Orthopedics;  Laterality: Right;  . TOTAL KNEE ARTHROPLASTY Right 12/20/2018   Procedure: RIGHT TOTAL KNEE ARTHROPLASTY;  Surgeon: Mcarthur Rossetti, MD;  Location: [facility];  Service: Orthopedics;  Laterality: Right;   Social History   Occupational History  . Not on file  Tobacco Use  . Smoking status: Former Smoker    Types: Cigarettes    Quit date: 04/15/1986    Years since quitting: 33.6  . Smokeless tobacco: Never Used  Substance and Sexual Activity  . Alcohol use: Yes    Alcohol/week: 7.0 standard drinks    Types: 7 Standard drinks or equivalent per week    Comment: 1 drink daily  . Drug use: No  . Sexual activity: Never

## 2019-12-17 ENCOUNTER — Ambulatory Visit
Admission: RE | Admit: 2019-12-17 | Discharge: 2019-12-17 | Disposition: A | Discharge status: Home / Self Care | Payer: Medicare HMO | Source: Ambulatory Visit | Attending: Geriatric Medicine | Admitting: Geriatric Medicine

## 2019-12-17 ENCOUNTER — Other Ambulatory Visit: Payer: Self-pay

## 2019-12-17 DIAGNOSIS — Z1231 Encounter for screening mammogram for malignant neoplasm of breast: Secondary | ICD-10-CM | POA: Diagnosis not present

## 2020-01-16 DIAGNOSIS — Z85828 Personal history of other malignant neoplasm of skin: Secondary | ICD-10-CM | POA: Diagnosis not present

## 2020-01-16 DIAGNOSIS — L918 Other hypertrophic disorders of the skin: Secondary | ICD-10-CM | POA: Diagnosis not present

## 2020-01-16 DIAGNOSIS — L570 Actinic keratosis: Secondary | ICD-10-CM | POA: Diagnosis not present

## 2020-01-16 DIAGNOSIS — L853 Xerosis cutis: Secondary | ICD-10-CM | POA: Diagnosis not present

## 2020-01-16 DIAGNOSIS — L720 Epidermal cyst: Secondary | ICD-10-CM | POA: Diagnosis not present

## 2020-01-16 DIAGNOSIS — D485 Neoplasm of uncertain behavior of skin: Secondary | ICD-10-CM | POA: Diagnosis not present

## 2020-01-16 DIAGNOSIS — C44519 Basal cell carcinoma of skin of other part of trunk: Secondary | ICD-10-CM | POA: Diagnosis not present

## 2020-01-16 DIAGNOSIS — L821 Other seborrheic keratosis: Secondary | ICD-10-CM | POA: Diagnosis not present

## 2020-01-16 DIAGNOSIS — D2339 Other benign neoplasm of skin of other parts of face: Secondary | ICD-10-CM | POA: Diagnosis not present

## 2020-02-02 DIAGNOSIS — R110 Nausea: Secondary | ICD-10-CM | POA: Diagnosis not present

## 2020-02-02 DIAGNOSIS — R42 Dizziness and giddiness: Secondary | ICD-10-CM | POA: Diagnosis not present

## 2020-02-02 DIAGNOSIS — I10 Essential (primary) hypertension: Secondary | ICD-10-CM | POA: Diagnosis not present

## 2020-02-19 DIAGNOSIS — H6122 Impacted cerumen, left ear: Secondary | ICD-10-CM | POA: Diagnosis not present

## 2020-03-04 DIAGNOSIS — C44519 Basal cell carcinoma of skin of other part of trunk: Secondary | ICD-10-CM | POA: Diagnosis not present

## 2020-03-05 DIAGNOSIS — R69 Illness, unspecified: Secondary | ICD-10-CM | POA: Diagnosis not present

## 2020-03-05 DIAGNOSIS — M8580 Other specified disorders of bone density and structure, unspecified site: Secondary | ICD-10-CM | POA: Diagnosis not present

## 2020-03-05 DIAGNOSIS — E7800 Pure hypercholesterolemia, unspecified: Secondary | ICD-10-CM | POA: Diagnosis not present

## 2020-03-05 DIAGNOSIS — I10 Essential (primary) hypertension: Secondary | ICD-10-CM | POA: Diagnosis not present

## 2020-03-18 DIAGNOSIS — Z85828 Personal history of other malignant neoplasm of skin: Secondary | ICD-10-CM | POA: Diagnosis not present

## 2020-03-18 DIAGNOSIS — L853 Xerosis cutis: Secondary | ICD-10-CM | POA: Diagnosis not present

## 2020-03-18 DIAGNOSIS — L570 Actinic keratosis: Secondary | ICD-10-CM | POA: Diagnosis not present

## 2020-03-23 DIAGNOSIS — R69 Illness, unspecified: Secondary | ICD-10-CM | POA: Diagnosis not present

## 2020-03-23 DIAGNOSIS — Z23 Encounter for immunization: Secondary | ICD-10-CM | POA: Diagnosis not present

## 2020-03-23 DIAGNOSIS — Z0000 Encounter for general adult medical examination without abnormal findings: Secondary | ICD-10-CM | POA: Diagnosis not present

## 2020-03-23 DIAGNOSIS — Z79899 Other long term (current) drug therapy: Secondary | ICD-10-CM | POA: Diagnosis not present

## 2020-03-23 DIAGNOSIS — E7800 Pure hypercholesterolemia, unspecified: Secondary | ICD-10-CM | POA: Diagnosis not present

## 2020-03-23 DIAGNOSIS — I10 Essential (primary) hypertension: Secondary | ICD-10-CM | POA: Diagnosis not present

## 2020-03-23 DIAGNOSIS — Z1389 Encounter for screening for other disorder: Secondary | ICD-10-CM | POA: Diagnosis not present

## 2020-04-09 ENCOUNTER — Ambulatory Visit (INDEPENDENT_AMBULATORY_CARE_PROVIDER_SITE_OTHER): Payer: Medicare HMO

## 2020-04-09 ENCOUNTER — Ambulatory Visit: Payer: Medicare HMO | Admitting: Podiatry

## 2020-04-09 ENCOUNTER — Other Ambulatory Visit: Payer: Self-pay

## 2020-04-09 ENCOUNTER — Encounter: Payer: Self-pay | Admitting: Podiatry

## 2020-04-09 VITALS — Temp 97.7°F

## 2020-04-09 DIAGNOSIS — M2041 Other hammer toe(s) (acquired), right foot: Secondary | ICD-10-CM

## 2020-04-09 DIAGNOSIS — M2042 Other hammer toe(s) (acquired), left foot: Secondary | ICD-10-CM

## 2020-04-09 DIAGNOSIS — L84 Corns and callosities: Secondary | ICD-10-CM

## 2020-04-09 NOTE — Patient Instructions (Signed)
Hammer Toe  Hammer toe is a change in the shape (a deformity) of your toe. The deformity causes the middle joint of your toe to stay bent. This causes pain, especially when you are wearing shoes. Hammer toe starts gradually. At first, the toe can be straightened. Gradually over time, the deformity becomes stiff and permanent. Early treatments to keep the toe straight may relieve pain. As the deformity becomes stiff and permanent, surgery may be needed to straighten the toe. What are the causes? Hammer toe is caused by abnormal bending of the toe joint that is closest to your foot. It happens gradually over time. This pulls on the muscles and connections (tendons) of the toe joint, making them weak and stiff. It is often related to wearing shoes that are too short or narrow and do not let your toes straighten. What increases the risk? You may be at greater risk for hammer toe if you:  Are female.  Are older.  Wear shoes that are too small.  Wear high-heeled shoes that pinch your toes.  Are a ballet dancer.  Have a second toe that is longer than your big toe (first toe).  Injure your foot or toe.  Have arthritis.  Have a family history of hammer toe.  Have a nerve or muscle disorder. What are the signs or symptoms? The main symptoms of this condition are pain and deformity of the toe. The pain is worse when wearing shoes, walking, or running. Other symptoms may include:  Corns or calluses over the bent part of the toe or between the toes.  Redness and a burning feeling on the toe.  An open sore that forms on the top of the toe.  Not being able to straighten the toe. How is this diagnosed? This condition is diagnosed based on your symptoms and a physical exam. During the exam, your health care provider will try to straighten your toe to see how stiff the deformity is. You may also have tests, such as:  A blood test to check for rheumatoid arthritis.  An X-ray to show how  severe the deformity is. How is this treated? Treatment for this condition will depend on how stiff the deformity is. Surgery is often needed. However, sometimes a hammer toe can be straightened without surgery. Treatments that do not involve surgery include:  Taping the toe into a straightened position.  Using pads and cushions to protect the toe (orthotics).  Wearing shoes that provide enough room for the toes.  Doing toe-stretching exercises at home.  Taking an NSAID to reduce pain and swelling. If these treatments do not help or the toe cannot be straightened, surgery is the next option. The most common surgeries used to straighten a hammer toe include:  Arthroplasty. In this procedure, part of the joint is removed, and that allows the toe to straighten.  Fusion. In this procedure, cartilage between the two bones of the joint is taken out and the bones are fused together into one longer bone.  Implantation. In this procedure, part of the bone is removed and replaced with an implant to let the toe move again.  Flexor tendon transfer. In this procedure, the tendons that curl the toes down (flexor tendons) are repositioned. Follow these instructions at home:  Take over-the-counter and prescription medicines only as told by your health care provider.  Do toe straightening and stretching exercises as told by your health care provider.  Keep all follow-up visits as told by your health care   provider. This is important. How is this prevented?  Wear shoes that give your toes enough room and do not cause pain.  Do not wear high-heeled shoes. Contact a health care provider if:  Your pain gets worse.  Your toe becomes red or swollen.  You develop an open sore on your toe. This information is not intended to replace advice given to you by your health care provider. Make sure you discuss any questions you have with your health care provider. Document Revised: 09/28/2017 Document  Reviewed: 02/09/2016 Elsevier Patient Education  2020 Elsevier Inc.  

## 2020-04-11 NOTE — Progress Notes (Signed)
Subjective:   Patient ID: [name], female   DOB: 79 y.o.   MRN: [id number]   HPI Patient presents with chronic digital deformities bilateral with lesions which are associated with them to a degree but also just been related to bone pressure.     ROS      Objective:  Physical Exam  Neurovascular status found to be intact with patient found to have digital deformities bilateral with painful rotated fifth toes and elevated second toes with keratotic lesion submetatarsal bones bilateral and digits bilateral     Assessment:  Hammertoe deformity bilateral along with keratotic lesion formation partially associated with this but not the entire way     Plan:  H&P reviewed conditions educating her on hammertoes and possibility for correction in future.  Today I did go ahead and I debrided all nails we will see the results of this plus debridement of lesions and decide what else may be necessary to try to help her long-term pain she is experiencing  X-rays indicate that there is digital deformities bilateral with elevation of the second digits of both feet that are that is moderately painful when pressed

## 2020-04-20 IMAGING — DX DG KNEE 1-2V PORT*R*
2 series · 2 of 2 positions shown · non-contrast
Comparison: None.

CLINICAL DATA: Status post right knee arthroplasty today.

EXAM:
PORTABLE RIGHT KNEE - 1-2 VIEW

[knee ap]
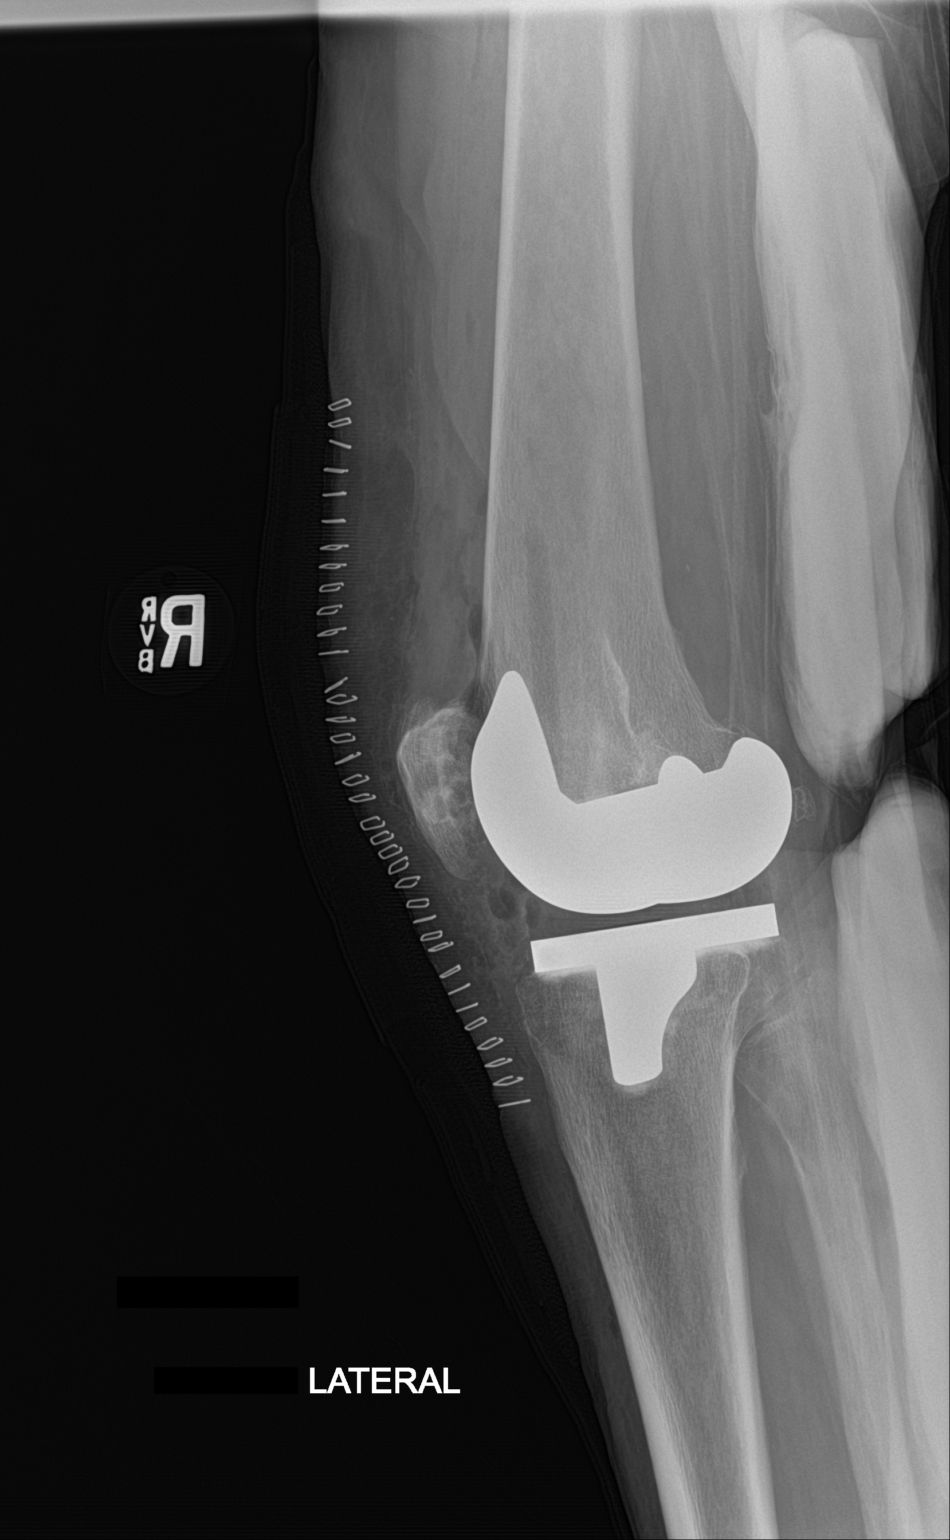

[knee lat]
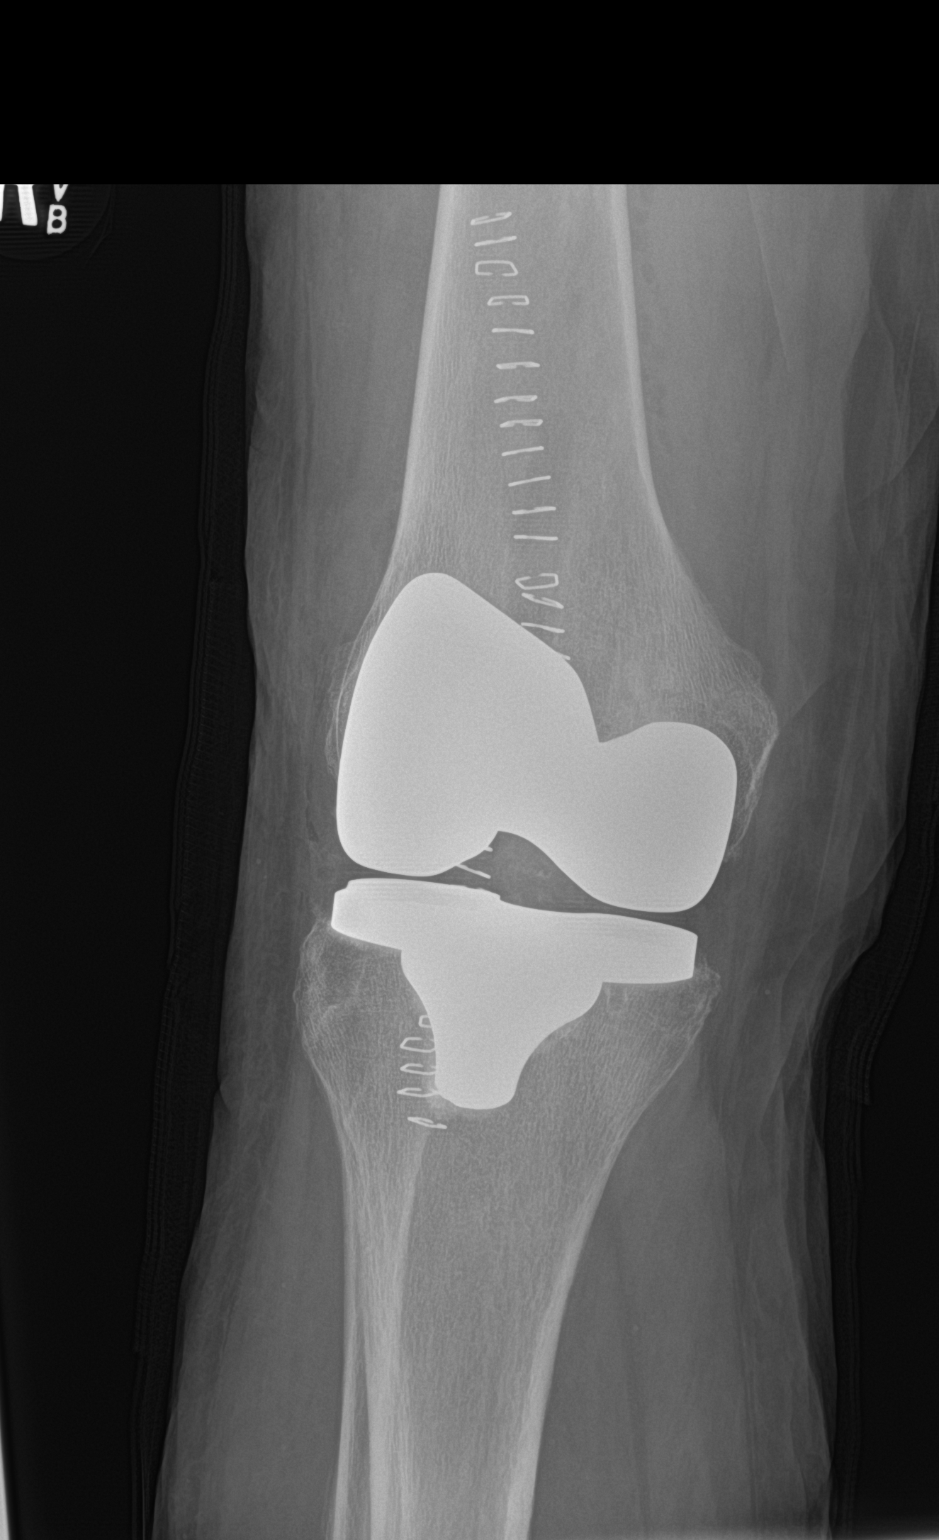

[2 of 2 positions shown; findings below may reference images not displayed]

FINDINGS: Right knee replacement is identified without malalignment.
Postsurgical changes including joint space fluid, air and skin
staples are identified.
IMPRESSION: Right knee replacement without malalignment.

## 2020-05-27 DIAGNOSIS — R69 Illness, unspecified: Secondary | ICD-10-CM | POA: Diagnosis not present

## 2020-05-27 DIAGNOSIS — I10 Essential (primary) hypertension: Secondary | ICD-10-CM | POA: Diagnosis not present

## 2020-05-27 DIAGNOSIS — M8580 Other specified disorders of bone density and structure, unspecified site: Secondary | ICD-10-CM | POA: Diagnosis not present

## 2020-05-27 DIAGNOSIS — E7800 Pure hypercholesterolemia, unspecified: Secondary | ICD-10-CM | POA: Diagnosis not present

## 2020-06-15 DIAGNOSIS — R69 Illness, unspecified: Secondary | ICD-10-CM | POA: Diagnosis not present

## 2020-06-17 ENCOUNTER — Ambulatory Visit: Payer: Medicare HMO | Admitting: Podiatry

## 2020-07-09 DIAGNOSIS — R69 Illness, unspecified: Secondary | ICD-10-CM | POA: Diagnosis not present

## 2020-07-09 DIAGNOSIS — I10 Essential (primary) hypertension: Secondary | ICD-10-CM | POA: Diagnosis not present

## 2020-07-09 DIAGNOSIS — E7800 Pure hypercholesterolemia, unspecified: Secondary | ICD-10-CM | POA: Diagnosis not present

## 2020-07-09 DIAGNOSIS — M8580 Other specified disorders of bone density and structure, unspecified site: Secondary | ICD-10-CM | POA: Diagnosis not present

## 2020-08-02 DIAGNOSIS — S51811A Laceration without foreign body of right forearm, initial encounter: Secondary | ICD-10-CM | POA: Diagnosis not present

## 2020-08-02 DIAGNOSIS — Z23 Encounter for immunization: Secondary | ICD-10-CM | POA: Diagnosis not present

## 2020-08-03 DIAGNOSIS — L821 Other seborrheic keratosis: Secondary | ICD-10-CM | POA: Diagnosis not present

## 2020-08-03 DIAGNOSIS — L570 Actinic keratosis: Secondary | ICD-10-CM | POA: Diagnosis not present

## 2020-08-03 DIAGNOSIS — D225 Melanocytic nevi of trunk: Secondary | ICD-10-CM | POA: Diagnosis not present

## 2020-08-03 DIAGNOSIS — L814 Other melanin hyperpigmentation: Secondary | ICD-10-CM | POA: Diagnosis not present

## 2020-08-03 DIAGNOSIS — L578 Other skin changes due to chronic exposure to nonionizing radiation: Secondary | ICD-10-CM | POA: Diagnosis not present

## 2020-08-03 DIAGNOSIS — Z85828 Personal history of other malignant neoplasm of skin: Secondary | ICD-10-CM | POA: Diagnosis not present

## 2020-08-13 DIAGNOSIS — H3500 Unspecified background retinopathy: Secondary | ICD-10-CM | POA: Diagnosis not present

## 2020-08-13 DIAGNOSIS — H353131 Nonexudative age-related macular degeneration, bilateral, early dry stage: Secondary | ICD-10-CM | POA: Diagnosis not present

## 2020-08-13 DIAGNOSIS — D23112 Other benign neoplasm of skin of right lower eyelid, including canthus: Secondary | ICD-10-CM | POA: Diagnosis not present

## 2020-08-13 DIAGNOSIS — H5213 Myopia, bilateral: Secondary | ICD-10-CM | POA: Diagnosis not present

## 2020-09-07 DIAGNOSIS — I10 Essential (primary) hypertension: Secondary | ICD-10-CM | POA: Diagnosis not present

## 2020-09-07 DIAGNOSIS — M8580 Other specified disorders of bone density and structure, unspecified site: Secondary | ICD-10-CM | POA: Diagnosis not present

## 2020-09-07 DIAGNOSIS — R69 Illness, unspecified: Secondary | ICD-10-CM | POA: Diagnosis not present

## 2020-09-07 DIAGNOSIS — E7800 Pure hypercholesterolemia, unspecified: Secondary | ICD-10-CM | POA: Diagnosis not present

## 2020-09-27 DIAGNOSIS — M2550 Pain in unspecified joint: Secondary | ICD-10-CM | POA: Diagnosis not present

## 2020-09-27 DIAGNOSIS — I10 Essential (primary) hypertension: Secondary | ICD-10-CM | POA: Diagnosis not present

## 2020-10-20 DIAGNOSIS — R739 Hyperglycemia, unspecified: Secondary | ICD-10-CM | POA: Diagnosis not present

## 2020-11-11 DIAGNOSIS — H25812 Combined forms of age-related cataract, left eye: Secondary | ICD-10-CM | POA: Diagnosis not present

## 2020-11-11 DIAGNOSIS — H2512 Age-related nuclear cataract, left eye: Secondary | ICD-10-CM | POA: Diagnosis not present

## 2020-11-19 DIAGNOSIS — D23112 Other benign neoplasm of skin of right lower eyelid, including canthus: Secondary | ICD-10-CM | POA: Diagnosis not present

## 2020-11-19 DIAGNOSIS — M8580 Other specified disorders of bone density and structure, unspecified site: Secondary | ICD-10-CM | POA: Diagnosis not present

## 2020-11-19 DIAGNOSIS — E7800 Pure hypercholesterolemia, unspecified: Secondary | ICD-10-CM | POA: Diagnosis not present

## 2020-11-19 DIAGNOSIS — I10 Essential (primary) hypertension: Secondary | ICD-10-CM | POA: Diagnosis not present

## 2020-11-19 DIAGNOSIS — R69 Illness, unspecified: Secondary | ICD-10-CM | POA: Diagnosis not present

## 2020-12-23 DIAGNOSIS — H2511 Age-related nuclear cataract, right eye: Secondary | ICD-10-CM | POA: Diagnosis not present

## 2020-12-23 DIAGNOSIS — H25811 Combined forms of age-related cataract, right eye: Secondary | ICD-10-CM | POA: Diagnosis not present

## 2021-01-21 DIAGNOSIS — H35353 Cystoid macular degeneration, bilateral: Secondary | ICD-10-CM | POA: Diagnosis not present

## 2021-01-22 DIAGNOSIS — R69 Illness, unspecified: Secondary | ICD-10-CM | POA: Diagnosis not present

## 2021-01-22 DIAGNOSIS — M8580 Other specified disorders of bone density and structure, unspecified site: Secondary | ICD-10-CM | POA: Diagnosis not present

## 2021-01-22 DIAGNOSIS — E7800 Pure hypercholesterolemia, unspecified: Secondary | ICD-10-CM | POA: Diagnosis not present

## 2021-01-22 DIAGNOSIS — I10 Essential (primary) hypertension: Secondary | ICD-10-CM | POA: Diagnosis not present

## 2021-02-04 DIAGNOSIS — H35353 Cystoid macular degeneration, bilateral: Secondary | ICD-10-CM | POA: Diagnosis not present

## 2021-02-10 ENCOUNTER — Other Ambulatory Visit: Payer: Self-pay | Admitting: Geriatric Medicine

## 2021-02-10 DIAGNOSIS — Z1231 Encounter for screening mammogram for malignant neoplasm of breast: Secondary | ICD-10-CM

## 2021-02-11 ENCOUNTER — Other Ambulatory Visit: Payer: Self-pay

## 2021-02-11 ENCOUNTER — Ambulatory Visit
Admission: RE | Admit: 2021-02-11 | Discharge: 2021-02-11 | Disposition: A | Discharge status: Home / Self Care | Payer: Medicare HMO | Source: Ambulatory Visit | Attending: Geriatric Medicine | Admitting: Geriatric Medicine

## 2021-02-11 DIAGNOSIS — Z1231 Encounter for screening mammogram for malignant neoplasm of breast: Secondary | ICD-10-CM | POA: Diagnosis not present

## 2021-02-18 DIAGNOSIS — H35353 Cystoid macular degeneration, bilateral: Secondary | ICD-10-CM | POA: Diagnosis not present

## 2021-02-28 DIAGNOSIS — Z961 Presence of intraocular lens: Secondary | ICD-10-CM | POA: Diagnosis not present

## 2021-03-01 DIAGNOSIS — R0781 Pleurodynia: Secondary | ICD-10-CM | POA: Diagnosis not present

## 2021-03-01 DIAGNOSIS — M6780 Other specified disorders of synovium and tendon, unspecified site: Secondary | ICD-10-CM | POA: Diagnosis not present

## 2021-03-01 DIAGNOSIS — I10 Essential (primary) hypertension: Secondary | ICD-10-CM | POA: Diagnosis not present

## 2021-03-02 ENCOUNTER — Ambulatory Visit (INDEPENDENT_AMBULATORY_CARE_PROVIDER_SITE_OTHER): Payer: Medicare HMO

## 2021-03-02 ENCOUNTER — Encounter: Payer: Self-pay | Admitting: Orthopaedic Surgery

## 2021-03-02 ENCOUNTER — Other Ambulatory Visit: Payer: Self-pay

## 2021-03-02 ENCOUNTER — Ambulatory Visit (INDEPENDENT_AMBULATORY_CARE_PROVIDER_SITE_OTHER): Payer: Medicare HMO | Admitting: Podiatry

## 2021-03-02 ENCOUNTER — Ambulatory Visit (INDEPENDENT_AMBULATORY_CARE_PROVIDER_SITE_OTHER): Payer: Medicare HMO | Admitting: Orthopaedic Surgery

## 2021-03-02 DIAGNOSIS — L84 Corns and callosities: Secondary | ICD-10-CM | POA: Diagnosis not present

## 2021-03-02 DIAGNOSIS — M25562 Pain in left knee: Secondary | ICD-10-CM | POA: Diagnosis not present

## 2021-03-02 DIAGNOSIS — Z96642 Presence of left artificial hip joint: Secondary | ICD-10-CM | POA: Diagnosis not present

## 2021-03-02 DIAGNOSIS — M2041 Other hammer toe(s) (acquired), right foot: Secondary | ICD-10-CM

## 2021-03-02 DIAGNOSIS — M779 Enthesopathy, unspecified: Secondary | ICD-10-CM | POA: Diagnosis not present

## 2021-03-02 DIAGNOSIS — M2042 Other hammer toe(s) (acquired), left foot: Secondary | ICD-10-CM

## 2021-03-02 DIAGNOSIS — M25552 Pain in left hip: Secondary | ICD-10-CM | POA: Diagnosis not present

## 2021-03-02 DIAGNOSIS — M1712 Unilateral primary osteoarthritis, left knee: Secondary | ICD-10-CM

## 2021-03-02 MED ORDER — TRIAMCINOLONE ACETONIDE 10 MG/ML IJ SUSP
20.0000 mg | Freq: Once | INTRAMUSCULAR | Status: AC
  Administered 2021-03-02: 20 mg

## 2021-03-02 NOTE — Progress Notes (Signed)
Office Visit Note   Patient: [name]           Date of Birth: [birth year]           MRN: [id number] Visit Date: 03/02/2021              Requested by: Lajean Manes, MD [address] [place],  [place] [address] PCP: Lajean Manes, MD   Assessment & Plan: Visit Diagnoses:  1. Left knee pain, unspecified chronicity   2. Pain in left hip   3. Unilateral primary osteoarthritis, left knee   4. History of left hip replacement     Plan: I did talk to her in length about robotic knee replacement surgery.  I I did gave her the name of a colleague in [place] who I trust and who does these types of knee replacement surgeries using robotic technology.  For now, she is not interested in any other intervention and will contact us if things worsen.  All questions and concerns were answered and addressed.  Follow-Up Instructions: Return if symptoms worsen or fail to improve.   Orders:  Orders Placed This Encounter  Procedures  . XR Knee 1-2 Views Left  . XR HIP UNILAT W OR W/O PELVIS 2-3 VIEWS LEFT   No orders of the defined types were placed in this encounter.     Procedures: No procedures performed   Clinical Data: No additional findings.   Subjective: Chief Complaint  Patient presents with  . Left Knee - Pain  . Left Hip - Pain  The patient comes in today with left knee and left hip pain.  She has had previous bilateral hip replacements and a right knee replacement.  Her left knee is been bothering her quite a bit.  She is 80 and active.  Is been really getting worse for about a month now.  She is not interested in steroid injections at this point because they have not helped.  She did inquire about robotic joint replacement surgery.  She has been having some thigh pain as well.  She denies any groin pain.  She denies any injury.  She is an active 80 year old and does not walk with assistive device.  She says is worse when she getting out of a chair and in the  morning but okay with walking.  She has had no other acute change in medical status.  She is not a diabetic.  HPI  Review of Systems She currently denies any headache, chest pain, shortness of breath, fever, chills, nausea, vomiting  Objective: Vital Signs: LMP 10/31/1991   Physical Exam She is alert and orient x3 and in no acute distress Ortho Exam Examination of her left hip shows a move smoothly and fluidly.  There are some pain in her thigh area itself.  Her left knee does have lateral joint line tenderness and valgus malalignment with patellofemoral crepitation good range of motion.  Is globally tender with a mild effusion. Specialty Comments:  No specialty comments available.  Imaging: XR HIP UNILAT W OR W/O PELVIS 2-3 VIEWS LEFT  Result Date: 03/02/2021 An AP pelvis and lateral left hip shows bilateral hip replacements with no complicating features.  XR Knee 1-2 Views Left  Result Date: 03/02/2021 2 views of the left knee show tricompartment arthritis involving mainly the lateral compartment and the patellofemoral joint.  There is slight valgus malalignment.    PMFS History: Patient Active Problem List   Diagnosis Date Noted  . Status  post total right knee replacement 12/20/2018  . Chronic pain of left knee 09/06/2017  . Unilateral primary osteoarthritis, left knee 09/06/2017  . Chronic pain of right knee 09/06/2017  . Leg length difference, acquired 03/06/2017  . Low back pain 03/06/2017  . Unilateral primary osteoarthritis, right knee 12/06/2016  . 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 Osteoarthritis of left thumb 10/15/2015  . Degenerative arthritis of hip 04/19/2012  . Anxiety   . Cancer ([place])   . Depression   . Arthritis   . RENAL CALCULUS, RIGHT 12/15/2008  . ABDOMINAL PAIN, UNSPECIFIED SITE 12/15/2008  . NEPHROLITHIASIS, HX OF 12/15/2008  . ANXIETY DEPRESSION 10/29/2007  . CONTACT DERMATITIS DUE TO SOLAR RADIATION 10/29/2007  .  OSTEOARTHROSIS, LOCAL, PRIMARY, UNSPC SITE 08/16/2007   Past Medical History:  Diagnosis Date  . Anxiety   . Arthritis    lower back, hip  . Back pain    generally "causes severe stiffness"  . Complication of anesthesia    "felt it made her feel very goofy for several monthS with general anesthetic"  . Depression   . GERD (gastroesophageal reflux disease)   . History of kidney stones 1988   x1 episode passed on own  . Hypertension   . 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 Osteopenia   . 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 Cancer Father        prostate  . Hypertension Father   . Heart attack Father   . Cancer Brother        Occupational psychologist  . Cancer Maternal Grandmother        throat  . Breast cancer Paternal Grandmother     Past Surgical History:  Procedure Laterality Date  . APPENDECTOMY  2010   open  . BREAST EXCISIONAL BIOPSY Left 1961   benign  . BREAST LUMPECTOMY Left 1961   benign tumor  . TOTAL HIP ARTHROPLASTY  04/19/2012   Procedure: TOTAL HIP ARTHROPLASTY ANTERIOR APPROACH;  Surgeon: Mcarthur Rossetti, MD;  Location: [facility];  Service: Orthopedics;  Laterality: Left;  Left Total Hip Arthroplasty  . TOTAL HIP ARTHROPLASTY Right 04/21/2016   Procedure: RIGHT TOTAL HIP ARTHROPLASTY ANTERIOR APPROACH;  Surgeon: Mcarthur Rossetti, MD;  Location: [facility];  Service: Orthopedics;  Laterality: Right;  . TOTAL KNEE ARTHROPLASTY Right 12/20/2018   Procedure: RIGHT TOTAL KNEE ARTHROPLASTY;  Surgeon: Mcarthur Rossetti, MD;  Location: [facility];  Service: Orthopedics;  Laterality: Right;   Social History   Occupational History  . Not on file  Tobacco Use  . Smoking status: Former Smoker    Types: Cigarettes    Quit date: 04/15/1986    Years since quitting: 34.9  . Smokeless tobacco: Never Used  Vaping Use  . Vaping Use: Never used  Substance and Sexual Activity  . Alcohol use: Yes     Alcohol/week: 7.0 standard drinks    Types: 7 Standard drinks or equivalent per week    Comment: 1 drink daily  . Drug use: No  . Sexual activity: Never

## 2021-03-02 NOTE — Progress Notes (Signed)
Subjective:   Patient ID: [name], female   DOB: 80 y.o.   MRN: [id number]   HPI Patient presents with severe digital deformity digit to left stating the hammertoes gotten worse and she thinks she needs to get it corrected along with pain in the second joint of the MPJ and the subfifth metatarsal head left is painful with several lesions that are painful when palpated   ROS      Objective:  Physical Exam  Significant hammertoe deformity rigid contracture which is worsened over the last year second left along with inflammatory capsulitis second MPJ left plantar capsule fifth MPJ left and keratotic consistent lesions that are loose and of the fifth MPJ     Assessment:  Severe hammertoe deformity rigid in nature second left inflammatory capsulitis second and fifth MPJ left porokeratotic lesions     Plan:  H&P reviewed the digital correction she wants to get it done I explained digital fusion release MPJ and she is going to do it in the fall.  Today I did inject periarticular around the second MPJ left 2 mg Dexasone Kenalog 5 mg Xylocaine in the subcu fifth MPJ I did sterile prep and injected the capsule 3 mg Dexasone Kenalog 5 mg Xylocaine and then I debrided several lesions distinctly plantar aspect left foot no iatrogenic bleeding reappoint routine care and again will be seen for consult concerning surgical correction  X-rays indicate significant dorsal contracture digit to left over right moderate osteoporosis arthritis noted consistent with age

## 2021-03-15 DIAGNOSIS — I10 Essential (primary) hypertension: Secondary | ICD-10-CM | POA: Diagnosis not present

## 2021-03-15 DIAGNOSIS — R69 Illness, unspecified: Secondary | ICD-10-CM | POA: Diagnosis not present

## 2021-03-15 DIAGNOSIS — M8580 Other specified disorders of bone density and structure, unspecified site: Secondary | ICD-10-CM | POA: Diagnosis not present

## 2021-03-15 DIAGNOSIS — E7800 Pure hypercholesterolemia, unspecified: Secondary | ICD-10-CM | POA: Diagnosis not present

## 2021-03-25 DIAGNOSIS — Z0000 Encounter for general adult medical examination without abnormal findings: Secondary | ICD-10-CM | POA: Diagnosis not present

## 2021-03-25 DIAGNOSIS — Z1389 Encounter for screening for other disorder: Secondary | ICD-10-CM | POA: Diagnosis not present

## 2021-03-25 DIAGNOSIS — E7800 Pure hypercholesterolemia, unspecified: Secondary | ICD-10-CM | POA: Diagnosis not present

## 2021-03-25 DIAGNOSIS — Z79899 Other long term (current) drug therapy: Secondary | ICD-10-CM | POA: Diagnosis not present

## 2021-03-25 DIAGNOSIS — M8580 Other specified disorders of bone density and structure, unspecified site: Secondary | ICD-10-CM | POA: Diagnosis not present

## 2021-03-25 DIAGNOSIS — R7303 Prediabetes: Secondary | ICD-10-CM | POA: Diagnosis not present

## 2021-03-25 DIAGNOSIS — Z780 Asymptomatic menopausal state: Secondary | ICD-10-CM | POA: Diagnosis not present

## 2021-03-25 DIAGNOSIS — I10 Essential (primary) hypertension: Secondary | ICD-10-CM | POA: Diagnosis not present

## 2021-03-25 DIAGNOSIS — R69 Illness, unspecified: Secondary | ICD-10-CM | POA: Diagnosis not present

## 2021-03-31 ENCOUNTER — Other Ambulatory Visit: Payer: Self-pay | Admitting: Geriatric Medicine

## 2021-03-31 DIAGNOSIS — M8580 Other specified disorders of bone density and structure, unspecified site: Secondary | ICD-10-CM

## 2021-04-15 DIAGNOSIS — L570 Actinic keratosis: Secondary | ICD-10-CM | POA: Diagnosis not present

## 2021-04-15 DIAGNOSIS — L820 Inflamed seborrheic keratosis: Secondary | ICD-10-CM | POA: Diagnosis not present

## 2021-04-15 DIAGNOSIS — L821 Other seborrheic keratosis: Secondary | ICD-10-CM | POA: Diagnosis not present

## 2021-04-27 ENCOUNTER — Encounter: Payer: Self-pay | Admitting: Orthopaedic Surgery

## 2021-05-06 DIAGNOSIS — E7800 Pure hypercholesterolemia, unspecified: Secondary | ICD-10-CM | POA: Diagnosis not present

## 2021-05-06 DIAGNOSIS — R69 Illness, unspecified: Secondary | ICD-10-CM | POA: Diagnosis not present

## 2021-05-06 DIAGNOSIS — I10 Essential (primary) hypertension: Secondary | ICD-10-CM | POA: Diagnosis not present

## 2021-05-06 DIAGNOSIS — M8580 Other specified disorders of bone density and structure, unspecified site: Secondary | ICD-10-CM | POA: Diagnosis not present

## 2021-07-05 DIAGNOSIS — Z604 Social exclusion and rejection: Secondary | ICD-10-CM | POA: Diagnosis not present

## 2021-07-05 DIAGNOSIS — I10 Essential (primary) hypertension: Secondary | ICD-10-CM | POA: Diagnosis not present

## 2021-07-05 DIAGNOSIS — M1990 Unspecified osteoarthritis, unspecified site: Secondary | ICD-10-CM | POA: Diagnosis not present

## 2021-07-05 DIAGNOSIS — R69 Illness, unspecified: Secondary | ICD-10-CM | POA: Diagnosis not present

## 2021-07-05 DIAGNOSIS — F325 Major depressive disorder, single episode, in full remission: Secondary | ICD-10-CM | POA: Diagnosis not present

## 2021-07-05 DIAGNOSIS — F419 Anxiety disorder, unspecified: Secondary | ICD-10-CM | POA: Diagnosis not present

## 2021-07-05 DIAGNOSIS — E785 Hyperlipidemia, unspecified: Secondary | ICD-10-CM | POA: Diagnosis not present

## 2021-07-05 DIAGNOSIS — Z803 Family history of malignant neoplasm of breast: Secondary | ICD-10-CM | POA: Diagnosis not present

## 2021-07-05 DIAGNOSIS — Z87891 Personal history of nicotine dependence: Secondary | ICD-10-CM | POA: Diagnosis not present

## 2021-07-05 DIAGNOSIS — Z91018 Allergy to other foods: Secondary | ICD-10-CM | POA: Diagnosis not present

## 2021-07-05 DIAGNOSIS — I2510 Atherosclerotic heart disease of native coronary artery without angina pectoris: Secondary | ICD-10-CM | POA: Diagnosis not present

## 2021-07-05 DIAGNOSIS — Z91048 Other nonmedicinal substance allergy status: Secondary | ICD-10-CM | POA: Diagnosis not present

## 2021-07-05 DIAGNOSIS — Z8249 Family history of ischemic heart disease and other diseases of the circulatory system: Secondary | ICD-10-CM | POA: Diagnosis not present

## 2021-07-06 ENCOUNTER — Other Ambulatory Visit: Payer: Self-pay

## 2021-07-06 ENCOUNTER — Encounter: Payer: Self-pay | Admitting: Podiatry

## 2021-07-06 ENCOUNTER — Ambulatory Visit: Payer: Medicare HMO | Admitting: Podiatry

## 2021-07-06 DIAGNOSIS — M779 Enthesopathy, unspecified: Secondary | ICD-10-CM

## 2021-07-06 DIAGNOSIS — M2042 Other hammer toe(s) (acquired), left foot: Secondary | ICD-10-CM | POA: Diagnosis not present

## 2021-07-06 DIAGNOSIS — L84 Corns and callosities: Secondary | ICD-10-CM

## 2021-07-06 DIAGNOSIS — M2041 Other hammer toe(s) (acquired), right foot: Secondary | ICD-10-CM

## 2021-07-06 NOTE — Progress Notes (Signed)
Subjective:   Patient ID: [name], female   DOB: 80 y.o.   MRN: [id number]   HPI Patient presents stating that so far she seems to be doing well with her digits he still gets a lesion underneath her foot she is wondering about orthotics and what the future will hold for her.  States her feet are feeling some better but she has numerous questions   ROS      Objective:  Physical Exam  Neurovascular status intact rigidly contracted second digit left with patient found to have plantarflexed fifth metatarsal left with lesion formation and prominent bone structure plantarly both feet     Assessment:  Foot structural issues with digital rigid contracture digit to left along with prominent metatarsal lesion formation with pain chronic in nature     Plan:  H&P discussed the future and treatment options and how we will go about treating her.  Debrided lesion fifth left no iatrogenic bleeding routine care to be considered discussed again the long-term digital fusion which may be necessary or met head resection and educated her on recovery and other options      

## 2021-07-15 DIAGNOSIS — H9201 Otalgia, right ear: Secondary | ICD-10-CM | POA: Diagnosis not present

## 2021-07-15 DIAGNOSIS — G4452 New daily persistent headache (NDPH): Secondary | ICD-10-CM | POA: Diagnosis not present

## 2021-07-15 DIAGNOSIS — Z23 Encounter for immunization: Secondary | ICD-10-CM | POA: Diagnosis not present

## 2021-07-28 ENCOUNTER — Telehealth: Payer: Self-pay | Admitting: Orthopaedic Surgery

## 2021-07-28 NOTE — Telephone Encounter (Signed)
Noted  

## 2021-07-28 NOTE — Telephone Encounter (Signed)
Pt called stating she would like to get a gel inj in her L knee and would like to start the process. Pt would like a CB when we have heard if she's been approved or denied.   [phone]

## 2021-08-09 DIAGNOSIS — L814 Other melanin hyperpigmentation: Secondary | ICD-10-CM | POA: Diagnosis not present

## 2021-08-09 DIAGNOSIS — Z23 Encounter for immunization: Secondary | ICD-10-CM | POA: Diagnosis not present

## 2021-08-09 DIAGNOSIS — L570 Actinic keratosis: Secondary | ICD-10-CM | POA: Diagnosis not present

## 2021-08-09 DIAGNOSIS — L821 Other seborrheic keratosis: Secondary | ICD-10-CM | POA: Diagnosis not present

## 2021-08-09 DIAGNOSIS — Z85828 Personal history of other malignant neoplasm of skin: Secondary | ICD-10-CM | POA: Diagnosis not present

## 2021-08-09 DIAGNOSIS — D225 Melanocytic nevi of trunk: Secondary | ICD-10-CM | POA: Diagnosis not present

## 2021-08-09 DIAGNOSIS — D485 Neoplasm of uncertain behavior of skin: Secondary | ICD-10-CM | POA: Diagnosis not present

## 2021-08-09 DIAGNOSIS — L578 Other skin changes due to chronic exposure to nonionizing radiation: Secondary | ICD-10-CM | POA: Diagnosis not present

## 2021-08-11 DIAGNOSIS — R143 Flatulence: Secondary | ICD-10-CM | POA: Diagnosis not present

## 2021-08-11 DIAGNOSIS — Z86010 Personal history of colonic polyps: Secondary | ICD-10-CM | POA: Diagnosis not present

## 2021-08-15 ENCOUNTER — Telehealth: Payer: Self-pay

## 2021-08-15 ENCOUNTER — Telehealth: Payer: Self-pay | Admitting: Orthopaedic Surgery

## 2021-08-15 NOTE — Telephone Encounter (Signed)
Called and left a Vm for patient to rerun my call concerning gel injection for left knee.

## 2021-08-15 NOTE — Telephone Encounter (Signed)
Patient called. Checking on the progress of the gel injection authorization. Her call back number is [phone]

## 2021-08-15 NOTE — Telephone Encounter (Signed)
VOB has been submitted for SynviscOne, left knee. Pending BV. 

## 2021-08-18 ENCOUNTER — Telehealth: Payer: Self-pay

## 2021-08-18 NOTE — Telephone Encounter (Signed)
PA required for SynviscOne, left knee. Faxed completed PA form to [facility] at [phone]. PA Pending

## 2021-08-19 ENCOUNTER — Telehealth: Payer: Self-pay

## 2021-08-19 NOTE — Telephone Encounter (Signed)
Called and left a VM for patient to CB to schedule for gel injection with Dr. Ninfa Linden or Artis Delay.  Approved for SynviscOne, left knee. [place] Patient will be responsible for 20% OOP. Co-pay of $25.00 PA Approval# [id number] Valid 08/18/2021- 11/18/2021

## 2021-09-05 DIAGNOSIS — L905 Scar conditions and fibrosis of skin: Secondary | ICD-10-CM | POA: Diagnosis not present

## 2021-09-05 DIAGNOSIS — Z23 Encounter for immunization: Secondary | ICD-10-CM | POA: Diagnosis not present

## 2021-09-05 DIAGNOSIS — D235 Other benign neoplasm of skin of trunk: Secondary | ICD-10-CM | POA: Diagnosis not present

## 2021-09-19 ENCOUNTER — Ambulatory Visit: Payer: Medicare HMO | Admitting: Orthopaedic Surgery

## 2021-09-19 ENCOUNTER — Encounter: Payer: Self-pay | Admitting: Orthopaedic Surgery

## 2021-09-19 ENCOUNTER — Other Ambulatory Visit: Payer: Self-pay

## 2021-09-19 DIAGNOSIS — M1712 Unilateral primary osteoarthritis, left knee: Secondary | ICD-10-CM | POA: Diagnosis not present

## 2021-09-19 DIAGNOSIS — M2170 Unequal limb length (acquired), unspecified site: Secondary | ICD-10-CM

## 2021-09-19 MED ORDER — HYLAN G-F 20 48 MG/6ML IX SOSY
48.0000 mg | PREFILLED_SYRINGE | INTRA_ARTICULAR | Status: AC | PRN
  Administered 2021-09-19: 48 mg via INTRA_ARTICULAR

## 2021-09-19 NOTE — Progress Notes (Signed)
   Procedure Note  Patient: [name]             Date of Birth: Feb 18, 1941           MRN: [id number]             Visit Date: 09/19/2021  Procedures: Visit Diagnoses:  1. Unilateral primary osteoarthritis, left knee     Large Joint Inj: L knee on 09/19/2021 9:55 AM Indications: pain and diagnostic evaluation Details: 22 G 1.5 in needle, superolateral approach  Arthrogram: No  Medications: 48 mg Hylan 48 MG/6ML Outcome: tolerated well, no immediate complications Procedure, treatment alternatives, risks and benefits explained, specific risks discussed. Consent was given by the patient. Immediately prior to procedure a time out was called to verify the correct patient, procedure, equipment, support staff and site/side marked as required. Patient was prepped and draped in the usual sterile fashion.   The patient is here today for scheduled hyaluronic acid injection with Synvisc 1 to treat the pain from left knee osteoarthritis.  She has failed other forms of conservative treatment including steroid injections.  These have helped her in the past.  She does have a leg length discrepancy having had previous total hip arthroplasties with the left side shorter than the right.  She does wear a shoe buildup on the left side and needs a new prescription for that as well.  She has had issues with insurance covering this and is becoming more expensive.  I did place Synvisc 1 in her left knee today without difficulty.  She is traveling to [place] next month.  I did give her prescription for new shoe buildup as well.  All questions and concerns were answered and addressed.

## 2021-09-20 ENCOUNTER — Encounter: Payer: Self-pay | Admitting: Orthopaedic Surgery

## 2021-09-26 ENCOUNTER — Ambulatory Visit (INDEPENDENT_AMBULATORY_CARE_PROVIDER_SITE_OTHER): Payer: Medicare HMO | Admitting: Orthopaedic Surgery

## 2021-09-26 ENCOUNTER — Other Ambulatory Visit: Payer: Self-pay

## 2021-09-26 ENCOUNTER — Encounter: Payer: Self-pay | Admitting: Orthopaedic Surgery

## 2021-09-26 DIAGNOSIS — M25462 Effusion, left knee: Secondary | ICD-10-CM

## 2021-09-26 DIAGNOSIS — M1712 Unilateral primary osteoarthritis, left knee: Secondary | ICD-10-CM | POA: Diagnosis not present

## 2021-09-26 NOTE — Progress Notes (Signed)
[name] comes in today with significant left knee swelling and synovitis.  Last week she had a hyaluronic acid injection into her left knee and has had some type of allergic response or other response to the injection.  She denies any fever and chills but does report pain and swelling in the left knee.  On examination of her left knee there is a moderate effusion.  I was able to aspirate 50 cc of fluid from her knee that gave her immediate relief.  There is no redness and she tolerates easily putting through her range of motion of the left knee.  I then placed a steroid in the knee to see if this will help her feel better since she is going on a big trip next month.  For now we will watch this closely and if she has any issues she will let us know.  All question concerns were answered addressed.  Follow-up is as needed.

## 2021-10-11 ENCOUNTER — Ambulatory Visit
Admission: RE | Admit: 2021-10-11 | Discharge: 2021-10-11 | Disposition: A | Discharge status: Home / Self Care | Payer: Medicare HMO | Source: Ambulatory Visit | Attending: Geriatric Medicine | Admitting: Geriatric Medicine

## 2021-10-11 DIAGNOSIS — Z780 Asymptomatic menopausal state: Secondary | ICD-10-CM | POA: Diagnosis not present

## 2021-10-11 DIAGNOSIS — M8580 Other specified disorders of bone density and structure, unspecified site: Secondary | ICD-10-CM

## 2021-11-09 DIAGNOSIS — I10 Essential (primary) hypertension: Secondary | ICD-10-CM | POA: Diagnosis not present

## 2021-11-09 DIAGNOSIS — N952 Postmenopausal atrophic vaginitis: Secondary | ICD-10-CM | POA: Diagnosis not present

## 2021-11-24 DIAGNOSIS — B379 Candidiasis, unspecified: Secondary | ICD-10-CM | POA: Diagnosis not present

## 2021-11-24 DIAGNOSIS — R399 Unspecified symptoms and signs involving the genitourinary system: Secondary | ICD-10-CM | POA: Diagnosis not present

## 2021-12-09 DIAGNOSIS — T161XXA Foreign body in right ear, initial encounter: Secondary | ICD-10-CM | POA: Diagnosis not present

## 2022-01-09 DIAGNOSIS — L570 Actinic keratosis: Secondary | ICD-10-CM | POA: Diagnosis not present

## 2022-01-09 DIAGNOSIS — C44629 Squamous cell carcinoma of skin of left upper limb, including shoulder: Secondary | ICD-10-CM | POA: Diagnosis not present

## 2022-01-09 DIAGNOSIS — Z23 Encounter for immunization: Secondary | ICD-10-CM | POA: Diagnosis not present

## 2022-01-09 DIAGNOSIS — D485 Neoplasm of uncertain behavior of skin: Secondary | ICD-10-CM | POA: Diagnosis not present

## 2022-01-25 DIAGNOSIS — H3500 Unspecified background retinopathy: Secondary | ICD-10-CM | POA: Diagnosis not present

## 2022-01-25 DIAGNOSIS — Z961 Presence of intraocular lens: Secondary | ICD-10-CM | POA: Diagnosis not present

## 2022-01-25 DIAGNOSIS — H353131 Nonexudative age-related macular degeneration, bilateral, early dry stage: Secondary | ICD-10-CM | POA: Diagnosis not present

## 2022-02-01 DIAGNOSIS — I10 Essential (primary) hypertension: Secondary | ICD-10-CM | POA: Diagnosis not present

## 2022-02-01 DIAGNOSIS — N761 Subacute and chronic vaginitis: Secondary | ICD-10-CM | POA: Diagnosis not present

## 2022-02-20 DIAGNOSIS — N952 Postmenopausal atrophic vaginitis: Secondary | ICD-10-CM | POA: Diagnosis not present

## 2022-02-20 DIAGNOSIS — N766 Ulceration of vulva: Secondary | ICD-10-CM | POA: Diagnosis not present

## 2022-02-23 DIAGNOSIS — L905 Scar conditions and fibrosis of skin: Secondary | ICD-10-CM | POA: Diagnosis not present

## 2022-02-23 DIAGNOSIS — C44629 Squamous cell carcinoma of skin of left upper limb, including shoulder: Secondary | ICD-10-CM | POA: Diagnosis not present

## 2022-03-17 DIAGNOSIS — K5730 Diverticulosis of large intestine without perforation or abscess without bleeding: Secondary | ICD-10-CM | POA: Diagnosis not present

## 2022-03-17 DIAGNOSIS — K6289 Other specified diseases of anus and rectum: Secondary | ICD-10-CM | POA: Diagnosis not present

## 2022-03-17 DIAGNOSIS — K644 Residual hemorrhoidal skin tags: Secondary | ICD-10-CM | POA: Diagnosis not present

## 2022-03-17 DIAGNOSIS — Z09 Encounter for follow-up examination after completed treatment for conditions other than malignant neoplasm: Secondary | ICD-10-CM | POA: Diagnosis not present

## 2022-03-17 DIAGNOSIS — Z86010 Personal history of colonic polyps: Secondary | ICD-10-CM | POA: Diagnosis not present

## 2022-03-17 DIAGNOSIS — D123 Benign neoplasm of transverse colon: Secondary | ICD-10-CM | POA: Diagnosis not present

## 2022-03-21 DIAGNOSIS — D123 Benign neoplasm of transverse colon: Secondary | ICD-10-CM | POA: Diagnosis not present

## 2022-04-05 DIAGNOSIS — I10 Essential (primary) hypertension: Secondary | ICD-10-CM | POA: Diagnosis not present

## 2022-04-05 DIAGNOSIS — Z0000 Encounter for general adult medical examination without abnormal findings: Secondary | ICD-10-CM | POA: Diagnosis not present

## 2022-04-05 DIAGNOSIS — E7800 Pure hypercholesterolemia, unspecified: Secondary | ICD-10-CM | POA: Diagnosis not present

## 2022-04-05 DIAGNOSIS — Z1331 Encounter for screening for depression: Secondary | ICD-10-CM | POA: Diagnosis not present

## 2022-04-05 DIAGNOSIS — D692 Other nonthrombocytopenic purpura: Secondary | ICD-10-CM | POA: Diagnosis not present

## 2022-04-05 DIAGNOSIS — Z79899 Other long term (current) drug therapy: Secondary | ICD-10-CM | POA: Diagnosis not present

## 2022-04-05 DIAGNOSIS — R7303 Prediabetes: Secondary | ICD-10-CM | POA: Diagnosis not present

## 2022-04-10 ENCOUNTER — Encounter: Payer: Self-pay | Admitting: Podiatry

## 2022-04-10 ENCOUNTER — Ambulatory Visit (INDEPENDENT_AMBULATORY_CARE_PROVIDER_SITE_OTHER): Payer: Medicare Other

## 2022-04-10 ENCOUNTER — Ambulatory Visit: Payer: Medicare Other | Admitting: Podiatry

## 2022-04-10 DIAGNOSIS — D0472 Carcinoma in situ of skin of left lower limb, including hip: Secondary | ICD-10-CM | POA: Diagnosis not present

## 2022-04-10 DIAGNOSIS — M2041 Other hammer toe(s) (acquired), right foot: Secondary | ICD-10-CM

## 2022-04-10 DIAGNOSIS — L84 Corns and callosities: Secondary | ICD-10-CM

## 2022-04-10 DIAGNOSIS — M2042 Other hammer toe(s) (acquired), left foot: Secondary | ICD-10-CM

## 2022-04-10 DIAGNOSIS — L578 Other skin changes due to chronic exposure to nonionizing radiation: Secondary | ICD-10-CM | POA: Diagnosis not present

## 2022-04-10 DIAGNOSIS — D485 Neoplasm of uncertain behavior of skin: Secondary | ICD-10-CM | POA: Diagnosis not present

## 2022-04-10 DIAGNOSIS — L814 Other melanin hyperpigmentation: Secondary | ICD-10-CM | POA: Diagnosis not present

## 2022-04-10 DIAGNOSIS — Z85828 Personal history of other malignant neoplasm of skin: Secondary | ICD-10-CM | POA: Diagnosis not present

## 2022-04-10 DIAGNOSIS — D0471 Carcinoma in situ of skin of right lower limb, including hip: Secondary | ICD-10-CM | POA: Diagnosis not present

## 2022-04-10 DIAGNOSIS — L821 Other seborrheic keratosis: Secondary | ICD-10-CM | POA: Diagnosis not present

## 2022-04-10 DIAGNOSIS — M21622 Bunionette of left foot: Secondary | ICD-10-CM

## 2022-04-10 NOTE — Progress Notes (Signed)
Subjective:   Patient ID: [name], female   DOB: 81 y.o.   MRN: [id number]   HPI Patient presents stating the second toe left has been bothering her more recently and underneath her left foot and states she has a lesion underneath the left and on the second digit right foot   ROS      Objective:  Physical Exam  Neuro vascular status intact rigid contracture digit to left elevated significantly along with keratotic lesions of fifth metatarsal head left moderately painful and second digit wrapped right moderately painful     Assessment:  Hammertoe deformity digit to left rigid contracture with pain when wearing shoes and wearing cushions along with keratotic lesion left and right both painful H and     Plan:  PA x-ray reviewed debrided lesions and I then discussed digital correction and reviewed digital fusion procedure.  She wants to get this done most likely but can wait till the fall and we will use cushioning currently and then she will use shoe gear modifications.  Reappoint in the fall  X-rays indicate severe rigid contracture digit to left and tailor's bunion deformity left

## 2022-04-11 DIAGNOSIS — N952 Postmenopausal atrophic vaginitis: Secondary | ICD-10-CM | POA: Diagnosis not present

## 2022-04-14 ENCOUNTER — Other Ambulatory Visit: Payer: Self-pay | Admitting: Geriatric Medicine

## 2022-04-14 DIAGNOSIS — Z1231 Encounter for screening mammogram for malignant neoplasm of breast: Secondary | ICD-10-CM

## 2022-04-19 ENCOUNTER — Ambulatory Visit
Admission: RE | Admit: 2022-04-19 | Discharge: 2022-04-19 | Disposition: A | Discharge status: Home / Self Care | Payer: PRIVATE HEALTH INSURANCE | Source: Ambulatory Visit | Attending: Geriatric Medicine | Admitting: Geriatric Medicine

## 2022-04-19 DIAGNOSIS — Z1231 Encounter for screening mammogram for malignant neoplasm of breast: Secondary | ICD-10-CM

## 2022-04-27 ENCOUNTER — Encounter: Payer: Self-pay | Admitting: Orthopaedic Surgery

## 2022-06-07 DIAGNOSIS — N9089 Other specified noninflammatory disorders of vulva and perineum: Secondary | ICD-10-CM | POA: Diagnosis not present

## 2022-06-07 DIAGNOSIS — N952 Postmenopausal atrophic vaginitis: Secondary | ICD-10-CM | POA: Diagnosis not present

## 2022-06-28 DIAGNOSIS — N9089 Other specified noninflammatory disorders of vulva and perineum: Secondary | ICD-10-CM | POA: Diagnosis not present

## 2022-07-05 ENCOUNTER — Other Ambulatory Visit: Payer: Self-pay

## 2022-07-05 DIAGNOSIS — D071 Carcinoma in situ of vulva: Secondary | ICD-10-CM | POA: Diagnosis not present

## 2022-07-10 ENCOUNTER — Telehealth: Payer: Self-pay | Admitting: *Deleted

## 2022-07-10 NOTE — Telephone Encounter (Signed)
Attempted to reach the patient to schedule a new patient appt with Dr Berline Lopes. LMOM to call the office back

## 2022-07-10 NOTE — Telephone Encounter (Signed)
Spoke with [name] regarding her referral to GYN oncology. She has an appointment scheduled with Dr.Tucker on 08/01/22 at 9:00am. Patient agrees to date and time. She has been provided with office address and location. She is also aware of our mask and visitor policy. Patient verbalized understanding and will call with any questions.

## 2022-07-11 DIAGNOSIS — C519 Malignant neoplasm of vulva, unspecified: Secondary | ICD-10-CM | POA: Diagnosis not present

## 2022-07-11 DIAGNOSIS — L570 Actinic keratosis: Secondary | ICD-10-CM | POA: Diagnosis not present

## 2022-07-11 DIAGNOSIS — D485 Neoplasm of uncertain behavior of skin: Secondary | ICD-10-CM | POA: Diagnosis not present

## 2022-07-14 ENCOUNTER — Telehealth: Payer: Self-pay | Admitting: *Deleted

## 2022-07-14 NOTE — Telephone Encounter (Signed)
Patient called and got appt rescheduled for 9/18 @ 2 pm with Dr. Ernestina Patches. Pt verbalized understanding.

## 2022-07-14 NOTE — Telephone Encounter (Signed)
Called and left the patient a message to call the office back. Offer the patient to move her appt up from 10/3 to 9/18, 9/19 or 9/25

## 2022-07-17 ENCOUNTER — Encounter: Payer: Self-pay | Admitting: Gynecologic Oncology

## 2022-07-17 ENCOUNTER — Encounter: Payer: Self-pay | Admitting: Psychiatry

## 2022-07-17 ENCOUNTER — Other Ambulatory Visit: Payer: Self-pay

## 2022-07-17 ENCOUNTER — Inpatient Hospital Stay: Payer: PRIVATE HEALTH INSURANCE | Attending: Gynecologic Oncology | Admitting: Psychiatry

## 2022-07-17 ENCOUNTER — Inpatient Hospital Stay (HOSPITAL_BASED_OUTPATIENT_CLINIC_OR_DEPARTMENT_OTHER): Payer: PRIVATE HEALTH INSURANCE | Admitting: Gynecologic Oncology

## 2022-07-17 VITALS — BP 131/58 | HR 71 | Temp 98.4°F | Resp 16 | Ht 65.98 in | Wt 155.0 lb

## 2022-07-17 DIAGNOSIS — Z87891 Personal history of nicotine dependence: Secondary | ICD-10-CM | POA: Diagnosis not present

## 2022-07-17 DIAGNOSIS — D071 Carcinoma in situ of vulva: Secondary | ICD-10-CM | POA: Diagnosis not present

## 2022-07-17 MED ORDER — TRAMADOL HCL 50 MG PO TABS: 50.0000 mg | ORAL_TABLET | Freq: Four times a day (QID) | ORAL | 0 refills | Status: DC | PRN

## 2022-07-17 MED ORDER — SENNOSIDES-DOCUSATE SODIUM 8.6-50 MG PO TABS: 2.0000 | ORAL_TABLET | Freq: Every day | ORAL | 0 refills | Status: DC

## 2022-07-17 NOTE — Patient Instructions (Addendum)
Preparing for your Surgery  Plan for surgery on July 25, 2022 with Dr. Bernadene Bell at [facility]. You will be scheduled for wide local excision of the vulva.   Pre-operative Testing -You will receive a phone call from presurgical testing at [facility] to discuss surgery instructions and arrange for lab work if needed.  -Bring your insurance card, copy of an advanced directive if applicable, medication list.  -You should not be taking blood thinners or aspirin at least ten days prior to surgery unless instructed by your surgeon.  -Do not take supplements such as fish oil (omega 3), red yeast rice, turmeric before your surgery. You want to avoid medications with aspirin in them including headache powders such as BC or Goody's), Excedrin migraine.  Day Before Surgery at [place] will be advised you can have clear liquids up until 3 hours before your surgery.    Your role in recovery Your role is to become active as soon as directed by your doctor, while still giving yourself time to heal.  Rest when you feel tired. You will be asked to do the following in order to speed your recovery:  - Cough and breathe deeply. This helps to clear and expand your lungs and can prevent pneumonia after surgery.  - [place]. Do mild physical activity. Walking or moving your legs help your circulation and body functions return to normal. Do not try to get up or walk alone the first time after surgery.   -If you develop swelling on one leg or the other, pain in the back of your leg, redness/warmth in one of your legs, please call the office or go to the Emergency Room to have a doppler to rule out a blood clot. For shortness of breath, chest pain-seek care in the Emergency Room as soon as possible. - Actively manage your pain. Managing your pain lets you move in comfort. We will ask you to rate your pain on a scale of zero to 10. It is your  responsibility to tell your doctor or nurse where and how much you hurt so your pain can be treated.  Special Considerations -Your final pathology results from surgery should be available around one week after surgery and the results will be relayed to you when available.  -FMLA forms can be faxed to [phone] and please allow 5-7 business days for completion.  Pain Management After Surgery -You will be prescribed your pain medication and bowel regimen medications before surgery so that you can have these available when you are discharged from the hospital. The pain medication is for use ONLY AFTER surgery and a new prescription will not be given.   -Make sure that you have Tylenol and Ibuprofen at home IF [place] to use on a regular basis after surgery for pain control. We recommend alternating the medications every hour to six hours since they work differently and are processed in the body differently for pain relief.  -Review the attached handout on narcotic use and their risks and side effects.   Bowel Regimen -You will be prescribed Sennakot-S to take nightly to prevent constipation especially if you are taking the narcotic pain medication intermittently.  It is important to prevent constipation and drink adequate amounts of liquids. You can stop taking this medication when you are not taking pain medication and you are back on your normal bowel routine.  Risks of Surgery  Risks of surgery are low but include bleeding, infection, damage to surrounding structures, re-operation, blood clots, and very rarely death.  AFTER SURGERY INSTRUCTIONS  Return to work:  1-2 weeks if applicable  We recommend purchasing several bags of frozen green peas and dividing them into ziploc bags. You will want to keep these in the freezer and have them ready to use as ice packs to the vulvar incision. Once the ice pack is no longer cold, you can get another from the freezer.  The frozen peas mold to your body better than a regular ice pack.   Activity: 1. Be up and out of the bed during the day.  Take a nap if needed.  You may walk up steps but be careful and use the hand rail.  Stair climbing will tire you more than you think, you may need to stop part way and rest.   2. No lifting or straining for 4 weeks over 10 pounds. No pushing, pulling, straining for 4 weeks.  3. No driving for minimum 24 hours after surgery.  Do not drive if you are taking narcotic pain medicine and make sure that your reaction time has returned.   4. You can shower as soon as the next day after surgery. Shower daily. No tub baths or submerging your body in water until cleared by your surgeon. If you have the soap that was given to you by pre-surgical testing that was used before surgery, you do not need to use it afterwards because this can irritate your incisions.   5. No sexual activity and nothing in the vagina for 4 weeks.  6. You may experience vaginal/vulvar spotting and discharge after surgery.  The spotting is normal but if you experience heavy bleeding, call our office.  7. Take Tylenol or ibuprofen first for pain if you are able to take these medications and only use narcotic pain medication for severe pain not relieved by the Tylenol or Ibuprofen.  Monitor your Tylenol intake to a max of 4,000 mg in a 24 hour period. You can alternate these medications after surgery.  Diet: 1. Low sodium Heart Healthy Diet is recommended but you are cleared to resume your normal (before surgery) diet after your procedure.  2. It is safe to use a laxative, such as Miralax or Colace, if you have difficulty moving your bowels. You have been prescribed Sennakot at bedtime every evening to keep bowel movements regular and to prevent constipation.    Wound Care: 1. Keep clean and dry.  Shower daily.  Reasons to call the Doctor: Fever - Oral temperature greater than 100.4 degrees  Fahrenheit Foul-smelling vaginal discharge Difficulty urinating Nausea and vomiting Increased pain at the site of the incision that is unrelieved with pain medicine. Difficulty breathing with or without chest pain New calf pain especially if only on one side Sudden, continuing increased vaginal bleeding with or without clots.   Contacts: For questions or concerns you should contact:  Dr. Bernadene Bell at [place], NP at [phone]  After Hours: call [phone] and have the GYN Oncologist paged/contacted (after 5 pm or on the weekends).  Messages sent via mychart are for non-urgent matters and are not responded to after hours so for urgent needs, please call the after hours number.

## 2022-07-17 NOTE — H&P (View-Only) (Signed)
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 Surgical excision is the mainstay of treatment, but ablative therapy or pharmacologic treatment is an option for some patients in certain clinical scenarios. The goals of treatment of vulvar dysplasia are to prevent development of vulvar squamous carcinoma and relieve any associated symptoms, such as pain or itching. The goal is also preserve vulvar anatomy as best as possible.  Excision provides both treatment and a diagnostic specimen for those women with high-grade dysplasia. Invasive squamous cell carcinoma is present at the time of excision in 10-22% of women with VIN on initial biopsy.    She has elected to proceed with surgical excision.  Patient consented for: Vulvar wide local excision on 07/25/22   The risks of surgery were discussed in detail and she understands these to including but not limited to bleeding requiring a blood transfusion, infection, wound separation, injury to adjacent organs (including but not limited to the bowels, bladder, ureters, nerves, blood vessels), unforseen complications, and possible need for re-exploration.  If the patient experiences any of these events, she understands that her hospitalization or recovery may be prolonged and that she may need to take additional medications for a prolonged period. The patient will receive DVT and antibiotic prophylaxis as indicated. She voiced a clear understanding. She had the opportunity to ask questions and written informed consent was obtained today. She wishes to proceed.  She does not require preoperative clearance. Her METs are >4.  All preoperative instructions were reviewed. Postoperative expectations were also reviewed. Written handouts were provided to the patient.  A copy of  this note was sent to the patient's referring provider.  Bernadene Bell, MD Gynecologic Oncology   Medical Decision Making I personally spent  TOTAL 43 minutes face-to-face and non-face-to-face in the care of this patient, which includes all pre, intra, and post visit time on the date of service.  5 minutes spent reviewing records prior to the visit 30 Minutes in patient contact      3 minutes in other billable services 5 minutes charting , conferring with consultants etc.   ------------  CC: Vulvar dysplasia  HISTORY OF PRESENT ILLNESS:  [name] is a 81 y.o. 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  She has been applying Premarin twice weekly internally and externally, and was also prescribed clobetasol ointment for the lesion.  She tried the clobetasol for several days but then discontinued due to worsening irritation.  She eventually restarted the clobetasol without improvement.  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  Today, patient reports that prior to her biopsy she noted irritation and pain of the posterior vulva. Since her biopsy her symptoms have improved. She denies abdominal bloating, early satiety, significant weight loss, change in bowel or bladder habits.    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    PAST SURGICAL HISTORY: Past Surgical History:  Procedure Laterality Date   APPENDECTOMY  2010   open   BREAST EXCISIONAL BIOPSY Left 1961   benign   BREAST LUMPECTOMY Left 1961   benign tumor   TOTAL HIP ARTHROPLASTY  04/19/2012   Procedure: TOTAL HIP ARTHROPLASTY ANTERIOR APPROACH;  Surgeon: Mcarthur Rossetti, MD;  Location: [facility];  Service: Orthopedics;  Laterality: Left;  Left Total Hip Arthroplasty   TOTAL HIP ARTHROPLASTY Right 04/21/2016   Procedure: RIGHT TOTAL HIP ARTHROPLASTY ANTERIOR APPROACH;  Surgeon: Mcarthur Rossetti, MD;  Location: [facility];  Service: Orthopedics;  Laterality: Right;   TOTAL KNEE ARTHROPLASTY Right 12/20/2018   Procedure: RIGHT TOTAL KNEE ARTHROPLASTY;  Surgeon: Mcarthur Rossetti, MD;  Location: [facility];  Service: Orthopedics;  Laterality: Right;    OB/GYN HISTORY: OB History  Gravida Para Term Preterm AB Living  '2 2       2  '$ SAB IAB Ectopic Multiple Live Births               # Outcome Date GA Lbr Len/2nd Weight Sex Delivery Anes PTL Lv  2 Para           1 Para               Age at menarche: 51 Age at menopause: 23 Hx of HRT: yes, pills (no longer) Hx of STI: no Last pap: years ago History of abnormal pap smears: once much earlier in life, no excisions or treatment, normal after  SCREENING STUDIES:  Last mammogram: 2023 Last colonoscopy: 2022  MEDICATIONS:  Current Outpatient Medications:    acetaminophen (TYLENOL) 325 MG tablet, 1 tablet as needed, Disp: , Rfl:    ALPRAZolam (XANAX) 0.25 MG tablet, Take 0.125 mg by mouth as needed for anxiety. , Disp: , Rfl:    aspirin 81 MG chewable tablet, Chew 1 tablet (81 mg total) by mouth 2 (two) times daily., Disp: 30 tablet, Rfl: 0   atorvastatin (LIPITOR) 10 MG tablet, Take 10 mg by mouth daily at 6 PM. , Disp: , Rfl: 11   Calcium Carb-Cholecalciferol (CALCIUM + D3  PO), Take 1 tablet by mouth daily., Disp: , Rfl:    calcium carbonate (TUMS - DOSED IN MG ELEMENTAL CALCIUM) 500 MG chewable tablet, Chew 1 tablet by mouth 2 (two) times daily as needed for indigestion or heartburn., Disp: , Rfl:    celecoxib (CELEBREX) 200 MG capsule, TAKE (1) CAPSULE DAILY. (Patient taking differently: Take 200 mg by mouth daily as needed for moderate pain. ), Disp: 30 capsule, Rfl: 2   citalopram (CELEXA) 40 MG tablet, Take 20 mg by mouth at bedtime. , Disp: , Rfl:    Difluprednate 0.05 % EMUL, Apply 1 drop to eye 3 (three) times daily., Disp: , Rfl:    docusate sodium (COLACE) 100 MG capsule, Take 100 mg by mouth daily as needed for mild constipation., Disp: , Rfl:    hydrochlorothiazide (HYDRODIURIL) 12.5 MG tablet, Take 12.5 mg by mouth every morning., Disp: , Rfl: 11   Multiple Vitamin (MULTIVITAMIN WITH MINERALS) TABS, Take 1 tablet by  mouth daily., Disp: , Rfl:    Polyethyl Glycol-Propyl Glycol (SYSTANE OP), Place 1 drop into both eyes daily as needed (For dry eyes.). , Disp: , Rfl:   ALLERGIES: Allergies  Allergen Reactions   Adhesive [Tape] Itching and Rash    FAMILY HISTORY: Family History  Problem Relation Age of Onset   Cancer Father        prostate   Hypertension Father    Heart attack Father    Cancer Brother        pancreatic/liver   Cancer Maternal Grandmother        throat   Breast cancer Paternal Grandmother    Ovarian cancer Neg Hx    Uterine cancer Neg Hx     SOCIAL HISTORY: Social History   Socioeconomic History   Marital status: Divorced    Spouse name: Not on file   Number of children: Not on file   Years of education: Not on file   Highest education level: Not on file  Occupational History   Not on file  Tobacco Use   Smoking status: Former    Types: Cigarettes    Quit date: 04/15/1986    Years since quitting: 36.2   Smokeless tobacco: Never  Vaping Use   Vaping Use: Never used  Substance and Sexual Activity   Alcohol use:  Yes    Alcohol/week: 7.0 standard drinks of alcohol    Types: 7 Standard drinks or equivalent per week    Comment: 1 drink daily   Drug use: No   Sexual activity: Never  Other Topics Concern   Not on file  Social History Narrative   Not on file   Social Determinants of Health   Financial Resource Strain: Not on file  Food Insecurity: Not on file  Transportation Needs: Not on file  Physical Activity: Not on file  Stress: Not on file  Social Connections: Not on file  Intimate Partner Violence: Not on file    REVIEW OF SYSTEMS: New patient intake form was reviewed.  Complete 10-system review is negative except for the following: joint pain, anxiety, depression  PHYSICAL EXAM: BP (!) 131/58 (BP Location: Left Arm, Patient Position: Sitting)   Pulse 71   Temp 98.4 F (36.9 C) (Oral)   Resp 16   Ht 5' 5.98" (1.676 m)   Wt 155 lb (70.3 kg)   LMP 10/31/1991   SpO2 98%   BMI 25.03 kg/m  Constitutional: No acute distress. Neuro/Psych: Alert, oriented.  Head and Neck: Normocephalic, atraumatic. Neck symmetric without masses. Sclera anicteric.  Respiratory: Normal work of breathing. Clear to auscultation bilaterally. Cardiovascular: Regular rate and rhythm, no murmurs, rubs, or gallops. Abdomen: Normoactive bowel sounds. Soft, non-distended, non-tender to palpation. No masses or hepatosplenomegaly appreciated. No evidence of hernia.  Well-healed midline infraumbilical incision. Extremities: Grossly normal range of motion. Warm, well perfused. No edema bilaterally. Skin: No rashes or lesions. Lymphatic: No inguinal adenopathy. Genitourinary: External genitalia with normal bilateral labia majora and minora.  Posterior fourchette at junction of perineal body and vaginal mucosa, evidence of prior biopsy, healing.  Mild erythema at this area.Marland Kitchen Urethral meatus without lesions or prolapse. Exam chaperoned by Joylene John, NP.   COLPOSCOPY PROCEDURE NOTE  Procedure Details: Patient  placed in dorsolithotomy position.  Acetic acid applied to the vulva. Colposcopic exam performed.   Adequate Exam: yes  Biopsy Specimen: no  Condition: Stable. Patient tolerated procedure well.  Complications: None  Findings: Area of prior biopsy site noted and additional  acetowhite changes posterior to biopsy site along posterior fourchette.  No other lesions or areas of acetowhite changes.  Colposcopic Impression: VIN3   LABORATORY AND RADIOLOGIC DATA: Outside medical records were reviewed to synthesize the above history, along with the history and physical obtained during the visit.  Outside laboratory, pathology reports were reviewed, with pertinent results below.    WBC  Date Value Ref Range Status  12/21/2018 10.6 (H) 4.0 - 10.5 K/uL Final   Hemoglobin  Date Value Ref Range Status  12/21/2018 10.2 (L) 12.0 - 15.0 g/dL Final   HCT  Date Value Ref Range Status  12/21/2018 32.4 (L) 36.0 - 46.0 % Final   Platelets  Date Value Ref Range Status  12/21/2018 161 150 - 400 K/uL Final   Creatinine, Ser  Date Value Ref Range Status  12/21/2018 0.68 0.44 - 1.00 mg/dL Final   AST  Date Value Ref Range Status  12/15/2008 18 0 - 37 U/L Final   ALT  Date Value Ref Range Status  12/15/2008 13 0 - 35 U/L Final

## 2022-07-17 NOTE — Progress Notes (Signed)
Patient here for new patient consultation with Dr. Bernadene Bell and for a pre-operative discussion prior to her scheduled surgery on July 25, 2022. She is scheduled for WLE of the vulva. The surgery was discussed in detail.  See after visit summary for additional details.      Discussed post-op pain management in detail including the aspects of the enhanced recovery pathway.  Advised her that a new prescription would be sent in for tramadol and it is only to be used for after her upcoming surgery.  We discussed the use of tylenol post-op and to monitor for a maximum of 4,000 mg in a 24 hour period.  Also prescribed sennakot to be used after surgery and to hold if having loose stools.  Discussed bowel regimen in detail. Post-op, discussed the use of resuming Celebrex OR taking ibuprofen but not together.   Discussed the use of measures to take at home to prevent DVT including frequent mobility.  Reportable signs and symptoms of DVT discussed. Post-operative instructions discussed and expectations for after surgery. Incisional care discussed as well including reportable signs and symptoms including erythema, drainage, wound separation.     5 minutes spent with the patient.  Verbalizing understanding of material discussed. No needs or concerns voiced at the end of the visit.   Advised patient to call for any needs.  Advised that her post-operative medications had been prescribed and could be picked up at any time.    This appointment is included in the global surgical bundle as pre-operative teaching and has no charge.

## 2022-07-17 NOTE — Patient Instructions (Signed)
Preparing for your Surgery   Plan for surgery on July 25, 2022 with Dr. Bernadene Bell at [facility]. You will be scheduled for wide local excision of the vulva.    Pre-operative Testing -You will receive a phone call from presurgical testing at [facility] to discuss surgery instructions and arrange for lab work if needed.   -Bring your insurance card, copy of an advanced directive if applicable, medication list.   -You should not be taking blood thinners or aspirin at least ten days prior to surgery unless instructed by your surgeon.   -Do not take supplements such as fish oil (omega 3), red yeast rice, turmeric before your surgery. You want to avoid medications with aspirin in them including headache powders such as BC or Goody's), Excedrin migraine.   Day Before Surgery at [place] will be advised you can have clear liquids up until 3 hours before your surgery.     Your role in recovery Your role is to become active as soon as directed by your doctor, while still giving yourself time to heal.  Rest when you feel tired. You will be asked to do the following in order to speed your recovery:   - Cough and breathe deeply. This helps to clear and expand your lungs and can prevent pneumonia after surgery.  - [place]. Do mild physical activity. Walking or moving your legs help your circulation and body functions return to normal. Do not try to get up or walk alone the first time after surgery.   -If you develop swelling on one leg or the other, pain in the back of your leg, redness/warmth in one of your legs, please call the office or go to the Emergency Room to have a doppler to rule out a blood clot. For shortness of breath, chest pain-seek care in the Emergency Room as soon as possible. - Actively manage your pain. Managing your pain lets you move in comfort. We will ask you to rate your pain on a scale of zero to 10. It is  your responsibility to tell your doctor or nurse where and how much you hurt so your pain can be treated.   Special Considerations -Your final pathology results from surgery should be available around one week after surgery and the results will be relayed to you when available.   -FMLA forms can be faxed to [phone] and please allow 5-7 business days for completion.   Pain Management After Surgery -You will be prescribed your pain medication and bowel regimen medications before surgery so that you can have these available when you are discharged from the hospital. The pain medication is for use ONLY AFTER surgery and a new prescription will not be given.    -Make sure that you have Tylenol and Ibuprofen at home IF [place] to use on a regular basis after surgery for pain control. We recommend alternating the medications every hour to six hours since they work differently and are processed in the body differently for pain relief.   -Review the attached handout on narcotic use and their risks and side effects.    Bowel Regimen -You will be prescribed Sennakot-S to take nightly to prevent constipation especially if you are taking the narcotic pain medication intermittently.  It is important to prevent constipation and drink adequate amounts of liquids. You can stop taking this medication when you are not taking pain  medication and you are back on your normal bowel routine.   Risks of Surgery Risks of surgery are low but include bleeding, infection, damage to surrounding structures, re-operation, blood clots, and very rarely death.   AFTER SURGERY INSTRUCTIONS   Return to work:  1-2 weeks if applicable   We recommend purchasing several bags of frozen green peas and dividing them into ziploc bags. You will want to keep these in the freezer and have them ready to use as ice packs to the vulvar incision. Once the ice pack is no longer cold, you can get another from  the freezer. The frozen peas mold to your body better than a regular ice pack.    Activity: 1. Be up and out of the bed during the day.  Take a nap if needed.  You may walk up steps but be careful and use the hand rail.  Stair climbing will tire you more than you think, you may need to stop part way and rest.    2. No lifting or straining for 4 weeks over 10 pounds. No pushing, pulling, straining for 4 weeks.   3. No driving for minimum 24 hours after surgery.  Do not drive if you are taking narcotic pain medicine and make sure that your reaction time has returned.    4. You can shower as soon as the next day after surgery. Shower daily. No tub baths or submerging your body in water until cleared by your surgeon. If you have the soap that was given to you by pre-surgical testing that was used before surgery, you do not need to use it afterwards because this can irritate your incisions.    5. No sexual activity and nothing in the vagina for 4 weeks.   6. You may experience vaginal/vulvar spotting and discharge after surgery.  The spotting is normal but if you experience heavy bleeding, call our office.   7. Take Tylenol or ibuprofen first for pain if you are able to take these medications and only use narcotic pain medication for severe pain not relieved by the Tylenol or Ibuprofen.  Monitor your Tylenol intake to a max of 4,000 mg in a 24 hour period. You can alternate these medications after surgery.   Diet: 1. Low sodium Heart Healthy Diet is recommended but you are cleared to resume your normal (before surgery) diet after your procedure.   2. It is safe to use a laxative, such as Miralax or Colace, if you have difficulty moving your bowels. You have been prescribed Sennakot at bedtime every evening to keep bowel movements regular and to prevent constipation.     Wound Care: 1. Keep clean and dry.  Shower daily.   Reasons to call the Doctor: Fever - Oral temperature greater than 100.4  degrees Fahrenheit Foul-smelling vaginal discharge Difficulty urinating Nausea and vomiting Increased pain at the site of the incision that is unrelieved with pain medicine. Difficulty breathing with or without chest pain New calf pain especially if only on one side Sudden, continuing increased vaginal bleeding with or without clots.   Contacts: For questions or concerns you should contact:   Dr. Bernadene Bell at [place], NP at [phone]   After Hours: call [phone] and have the GYN Oncologist paged/contacted (after 5 pm or on the weekends).   Messages sent via mychart are for non-urgent matters and are not responded to after hours so for urgent needs, please call the after hours number.

## 2022-07-17 NOTE — Progress Notes (Addendum)
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 Surgical excision is the mainstay of treatment, but ablative therapy or pharmacologic treatment is an option for some patients in certain clinical scenarios. The goals of treatment of vulvar dysplasia are to prevent development of vulvar squamous carcinoma and relieve any associated symptoms, such as pain or itching. The goal is also preserve vulvar anatomy as best as possible.  Excision provides both treatment and a diagnostic specimen for those women with high-grade dysplasia. Invasive squamous cell carcinoma is present at the time of excision in 10-22% of women with VIN on initial biopsy.    She has elected to proceed with surgical excision.  Patient consented for: Vulvar wide local excision on 07/25/22   The risks of surgery were discussed in detail and she understands these to including but not limited to bleeding requiring a blood transfusion, infection, wound separation, injury to adjacent organs (including but not limited to the bowels, bladder, ureters, nerves, blood vessels), unforseen complications, and possible need for re-exploration.  If the patient experiences any of these events, she understands that her hospitalization or recovery may be prolonged and that she may need to take additional medications for a prolonged period. The patient will receive DVT and antibiotic prophylaxis as indicated. She voiced a clear understanding. She had the opportunity to ask questions and written informed consent was obtained today. She wishes to proceed.  She does not require preoperative clearance. Her METs are >4.  All preoperative instructions were reviewed. Postoperative expectations were also reviewed. Written handouts were provided to the patient.  A copy of  this note was sent to the patient's referring provider.  Bernadene Bell, MD Gynecologic Oncology   Medical Decision Making I personally spent  TOTAL 43 minutes face-to-face and non-face-to-face in the care of this patient, which includes all pre, intra, and post visit time on the date of service.  5 minutes spent reviewing records prior to the visit 30 Minutes in patient contact      3 minutes in other billable services 5 minutes charting , conferring with consultants etc.   ------------  CC: Vulvar dysplasia  HISTORY OF PRESENT ILLNESS:  [name] is a 81 y.o. 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  She has been applying Premarin twice weekly internally and externally, and was also prescribed clobetasol ointment for the lesion.  She tried the clobetasol for several days but then discontinued due to worsening irritation.  She eventually restarted the clobetasol without improvement.  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  Today, patient reports that prior to her biopsy she noted irritation and pain of the posterior vulva. Since her biopsy her symptoms have improved. She denies abdominal bloating, early satiety, significant weight loss, change in bowel or bladder habits.    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    PAST SURGICAL HISTORY: Past Surgical History:  Procedure Laterality Date   APPENDECTOMY  2010   open   BREAST EXCISIONAL BIOPSY Left 1961   benign   BREAST LUMPECTOMY Left 1961   benign tumor   TOTAL HIP ARTHROPLASTY  04/19/2012   Procedure: TOTAL HIP ARTHROPLASTY ANTERIOR APPROACH;  Surgeon: Mcarthur Rossetti, MD;  Location: [facility];  Service: Orthopedics;  Laterality: Left;  Left Total Hip Arthroplasty   TOTAL HIP ARTHROPLASTY Right 04/21/2016   Procedure: RIGHT TOTAL HIP ARTHROPLASTY ANTERIOR APPROACH;  Surgeon: Mcarthur Rossetti, MD;  Location: [facility];  Service: Orthopedics;  Laterality: Right;   TOTAL KNEE ARTHROPLASTY Right 12/20/2018   Procedure: RIGHT TOTAL KNEE ARTHROPLASTY;  Surgeon: Mcarthur Rossetti, MD;  Location: [facility];  Service: Orthopedics;  Laterality: Right;    OB/GYN HISTORY: OB History  Gravida Para Term Preterm AB Living  '2 2       2  '$ SAB IAB Ectopic Multiple Live Births               # Outcome Date GA Lbr Len/2nd Weight Sex Delivery Anes PTL Lv  2 Para           1 Para               Age at menarche: 42 Age at menopause: 26 Hx of HRT: yes, pills (no longer) Hx of STI: no Last pap: years ago History of abnormal pap smears: once much earlier in life, no excisions or treatment, normal after  SCREENING STUDIES:  Last mammogram: 2023 Last colonoscopy: 2022  MEDICATIONS:  Current Outpatient Medications:    acetaminophen (TYLENOL) 325 MG tablet, 1 tablet as needed, Disp: , Rfl:    ALPRAZolam (XANAX) 0.25 MG tablet, Take 0.125 mg by mouth as needed for anxiety. , Disp: , Rfl:    aspirin 81 MG chewable tablet, Chew 1 tablet (81 mg total) by mouth 2 (two) times daily., Disp: 30 tablet, Rfl: 0   atorvastatin (LIPITOR) 10 MG tablet, Take 10 mg by mouth daily at 6 PM. , Disp: , Rfl: 11   Calcium Carb-Cholecalciferol (CALCIUM + D3  PO), Take 1 tablet by mouth daily., Disp: , Rfl:    calcium carbonate (TUMS - DOSED IN MG ELEMENTAL CALCIUM) 500 MG chewable tablet, Chew 1 tablet by mouth 2 (two) times daily as needed for indigestion or heartburn., Disp: , Rfl:    celecoxib (CELEBREX) 200 MG capsule, TAKE (1) CAPSULE DAILY. (Patient taking differently: Take 200 mg by mouth daily as needed for moderate pain. ), Disp: 30 capsule, Rfl: 2   citalopram (CELEXA) 40 MG tablet, Take 20 mg by mouth at bedtime. , Disp: , Rfl:    Difluprednate 0.05 % EMUL, Apply 1 drop to eye 3 (three) times daily., Disp: , Rfl:    docusate sodium (COLACE) 100 MG capsule, Take 100 mg by mouth daily as needed for mild constipation., Disp: , Rfl:    hydrochlorothiazide (HYDRODIURIL) 12.5 MG tablet, Take 12.5 mg by mouth every morning., Disp: , Rfl: 11   Multiple Vitamin (MULTIVITAMIN WITH MINERALS) TABS, Take 1 tablet by  mouth daily., Disp: , Rfl:    Polyethyl Glycol-Propyl Glycol (SYSTANE OP), Place 1 drop into both eyes daily as needed (For dry eyes.). , Disp: , Rfl:   ALLERGIES: Allergies  Allergen Reactions   Adhesive [Tape] Itching and Rash    FAMILY HISTORY: Family History  Problem Relation Age of Onset   Cancer Father        prostate   Hypertension Father    Heart attack Father    Cancer Brother        pancreatic/liver   Cancer Maternal Grandmother        throat   Breast cancer Paternal Grandmother    Ovarian cancer Neg Hx    Uterine cancer Neg Hx     SOCIAL HISTORY: Social History   Socioeconomic History   Marital status: Divorced    Spouse name: Not on file   Number of children: Not on file   Years of education: Not on file   Highest education level: Not on file  Occupational History   Not on file  Tobacco Use   Smoking status: Former    Types: Cigarettes    Quit date: 04/15/1986    Years since quitting: 36.2   Smokeless tobacco: Never  Vaping Use   Vaping Use: Never used  Substance and Sexual Activity   Alcohol use:  Yes    Alcohol/week: 7.0 standard drinks of alcohol    Types: 7 Standard drinks or equivalent per week    Comment: 1 drink daily   Drug use: No   Sexual activity: Never  Other Topics Concern   Not on file  Social History Narrative   Not on file   Social Determinants of Health   Financial Resource Strain: Not on file  Food Insecurity: Not on file  Transportation Needs: Not on file  Physical Activity: Not on file  Stress: Not on file  Social Connections: Not on file  Intimate Partner Violence: Not on file    REVIEW OF SYSTEMS: New patient intake form was reviewed.  Complete 10-system review is negative except for the following: joint pain, anxiety, depression  PHYSICAL EXAM: BP (!) 131/58 (BP Location: Left Arm, Patient Position: Sitting)   Pulse 71   Temp 98.4 F (36.9 C) (Oral)   Resp 16   Ht 5' 5.98" (1.676 m)   Wt 155 lb (70.3 kg)   LMP 10/31/1991   SpO2 98%   BMI 25.03 kg/m  Constitutional: No acute distress. Neuro/Psych: Alert, oriented.  Head and Neck: Normocephalic, atraumatic. Neck symmetric without masses. Sclera anicteric.  Respiratory: Normal work of breathing. Clear to auscultation bilaterally. Cardiovascular: Regular rate and rhythm, no murmurs, rubs, or gallops. Abdomen: Normoactive bowel sounds. Soft, non-distended, non-tender to palpation. No masses or hepatosplenomegaly appreciated. No evidence of hernia.  Well-healed midline infraumbilical incision. Extremities: Grossly normal range of motion. Warm, well perfused. No edema bilaterally. Skin: No rashes or lesions. Lymphatic: No inguinal adenopathy. Genitourinary: External genitalia with normal bilateral labia majora and minora.  Posterior fourchette at junction of perineal body and vaginal mucosa, evidence of prior biopsy, healing.  Mild erythema at this area.Marland Kitchen Urethral meatus without lesions or prolapse. Exam chaperoned by Joylene John, NP.   COLPOSCOPY PROCEDURE NOTE  Procedure Details: Patient  placed in dorsolithotomy position.  Acetic acid applied to the vulva. Colposcopic exam performed.   Adequate Exam: yes  Biopsy Specimen: no  Condition: Stable. Patient tolerated procedure well.  Complications: None  Findings: Area of prior biopsy site noted and additional  acetowhite changes posterior to biopsy site along posterior fourchette.  No other lesions or areas of acetowhite changes.  Colposcopic Impression: VIN3   LABORATORY AND RADIOLOGIC DATA: Outside medical records were reviewed to synthesize the above history, along with the history and physical obtained during the visit.  Outside laboratory, pathology reports were reviewed, with pertinent results below.    WBC  Date Value Ref Range Status  12/21/2018 10.6 (H) 4.0 - 10.5 K/uL Final   Hemoglobin  Date Value Ref Range Status  12/21/2018 10.2 (L) 12.0 - 15.0 g/dL Final   HCT  Date Value Ref Range Status  12/21/2018 32.4 (L) 36.0 - 46.0 % Final   Platelets  Date Value Ref Range Status  12/21/2018 161 150 - 400 K/uL Final   Creatinine, Ser  Date Value Ref Range Status  12/21/2018 0.68 0.44 - 1.00 mg/dL Final   AST  Date Value Ref Range Status  12/15/2008 18 0 - 37 U/L Final   ALT  Date Value Ref Range Status  12/15/2008 13 0 - 35 U/L Final

## 2022-07-18 ENCOUNTER — Encounter (HOSPITAL_BASED_OUTPATIENT_CLINIC_OR_DEPARTMENT_OTHER): Payer: Self-pay | Admitting: Psychiatry

## 2022-07-18 NOTE — Progress Notes (Signed)
Spoke w/ via phone for pre-op interview--- pt Lab needs dos----   [employer], ekg (per anes)/ pre-op orders pending            Lab results------ no COVID test -----patient states asymptomatic no test needed Arrive at ------- 1045 on 07-18-2022 NPO after MN NO Solid Food.  Clear liquids from MN until--- 0945 Med rec completed Medications to take morning of surgery ----- none Diabetic medication ----- n/a Patient instructed no nail polish to be worn day of surgery Patient instructed to bring photo id and insurance card day of surgery Patient aware to have Driver (ride ) / caregiver for 24 hours after surgery --- unsure since pt just posted Pt verbalized understanding to have name of driver/ caregiver when check-in dos Patient Special Instructions ----- hold HCTZ am dos Pre-Op special Istructions ----- case just added on, pre-op orders pending Patient verbalized understanding of instructions that were given at this phone interview. Patient denies shortness of breath, chest pain, fever, cough at this phone interview.

## 2022-07-24 ENCOUNTER — Ambulatory Visit (INDEPENDENT_AMBULATORY_CARE_PROVIDER_SITE_OTHER): Payer: Medicare Other

## 2022-07-24 ENCOUNTER — Telehealth: Payer: Self-pay

## 2022-07-24 ENCOUNTER — Ambulatory Visit (INDEPENDENT_AMBULATORY_CARE_PROVIDER_SITE_OTHER): Payer: Medicare Other | Admitting: Orthopaedic Surgery

## 2022-07-24 DIAGNOSIS — M25562 Pain in left knee: Secondary | ICD-10-CM

## 2022-07-24 DIAGNOSIS — G8929 Other chronic pain: Secondary | ICD-10-CM

## 2022-07-24 DIAGNOSIS — M25462 Effusion, left knee: Secondary | ICD-10-CM

## 2022-07-24 DIAGNOSIS — M1712 Unilateral primary osteoarthritis, left knee: Secondary | ICD-10-CM | POA: Diagnosis not present

## 2022-07-24 NOTE — Progress Notes (Signed)
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  She has had Synvisc 1 injections twice in the past but developed allergic reactions to Synvisc 1 in both times had to have all the fluid drained off her knee less than a week after those injections were placed in her knee.  She is wondering about a different hyaluronic acid injection.  She has well-documented osteoarthritis of the left knee.  X-rays of the left knee today continue to show tricompartment arthritis mainly involving the lateral compartment and patellofemoral joint.  She is not having a lot of pain today but she is traveling out of the country in late January so I feel it is reasonable to try Euflexxa is a series of 3 injections that we can provide in December to treat the pain from osteoarthritis of her left knee.  Steroid injections have failed in the past.  We will work on ordering Euflexxa for her left knee to start in early December for 3 weeks in a row.  She agrees with this treatment plan.  We will be in touch.

## 2022-07-24 NOTE — Telephone Encounter (Signed)
Telephone call to check on pre-operative status.  Patient compliant with pre-operative instructions.  Reinforced nothing to eat after midnight. Clear liquids until 9:45. Patient to arrive at 10:45.  No questions or concerns voiced.  Instructed to call for any needs.

## 2022-07-25 ENCOUNTER — Ambulatory Visit (HOSPITAL_BASED_OUTPATIENT_CLINIC_OR_DEPARTMENT_OTHER): Payer: Medicare Other | Admitting: Anesthesiology

## 2022-07-25 ENCOUNTER — Telehealth: Payer: Self-pay

## 2022-07-25 ENCOUNTER — Other Ambulatory Visit: Payer: Self-pay

## 2022-07-25 ENCOUNTER — Ambulatory Visit (HOSPITAL_BASED_OUTPATIENT_CLINIC_OR_DEPARTMENT_OTHER)
Admission: RE | Admit: 2022-07-25 | Discharge: 2022-07-25 | Disposition: A | Discharge status: Home / Self Care | Payer: Medicare Other | Attending: Psychiatry | Admitting: Psychiatry

## 2022-07-25 ENCOUNTER — Encounter (HOSPITAL_BASED_OUTPATIENT_CLINIC_OR_DEPARTMENT_OTHER)
Admission: RE | Disposition: A | Discharge status: Home / Self Care | Payer: Self-pay | Source: Home / Self Care | Attending: Psychiatry

## 2022-07-25 ENCOUNTER — Encounter (HOSPITAL_BASED_OUTPATIENT_CLINIC_OR_DEPARTMENT_OTHER): Payer: Self-pay | Admitting: Psychiatry

## 2022-07-25 DIAGNOSIS — F418 Other specified anxiety disorders: Secondary | ICD-10-CM | POA: Insufficient documentation

## 2022-07-25 DIAGNOSIS — R87612 Low grade squamous intraepithelial lesion on cytologic smear of cervix (LGSIL): Secondary | ICD-10-CM | POA: Diagnosis not present

## 2022-07-25 DIAGNOSIS — G8929 Other chronic pain: Secondary | ICD-10-CM | POA: Insufficient documentation

## 2022-07-25 DIAGNOSIS — I10 Essential (primary) hypertension: Secondary | ICD-10-CM

## 2022-07-25 DIAGNOSIS — K219 Gastro-esophageal reflux disease without esophagitis: Secondary | ICD-10-CM | POA: Insufficient documentation

## 2022-07-25 DIAGNOSIS — M545 Low back pain, unspecified: Secondary | ICD-10-CM | POA: Diagnosis not present

## 2022-07-25 DIAGNOSIS — Z87891 Personal history of nicotine dependence: Secondary | ICD-10-CM

## 2022-07-25 DIAGNOSIS — D071 Carcinoma in situ of vulva: Secondary | ICD-10-CM

## 2022-07-25 DIAGNOSIS — M1990 Unspecified osteoarthritis, unspecified site: Secondary | ICD-10-CM | POA: Diagnosis not present

## 2022-07-25 DIAGNOSIS — Z01818 Encounter for other preprocedural examination: Secondary | ICD-10-CM

## 2022-07-25 HISTORY — PX: VULVECTOMY: SHX1086

## 2022-07-25 HISTORY — DX: Low back pain, unspecified: M54.50

## 2022-07-25 HISTORY — DX: Presence of spectacles and contact lenses: Z97.3

## 2022-07-25 HISTORY — DX: Presence of external hearing-aid: Z97.4

## 2022-07-25 HISTORY — DX: Other intervertebral disc degeneration, lumbosacral region without mention of lumbar back pain or lower extremity pain: M51.379

## 2022-07-25 HISTORY — DX: Other intervertebral disc degeneration, lumbosacral region: M51.37

## 2022-07-25 HISTORY — DX: Personal history of other malignant neoplasm of skin: Z85.828

## 2022-07-25 HISTORY — DX: Unspecified osteoarthritis, unspecified site: M19.90

## 2022-07-25 HISTORY — DX: Other chronic pain: G89.29

## 2022-07-25 LAB — POCT I-STAT, CHEM 8
BUN: 9 mg/dL (ref 8–23)
Calcium, Ion: 1.26 mmol/L (ref 1.15–1.40)
Chloride: 101 mmol/L (ref 98–111)
Creatinine, Ser: 0.8 mg/dL (ref 0.44–1.00)
Glucose, Bld: 87 mg/dL (ref 70–99)
HCT: 41 % (ref 36.0–46.0)
Hemoglobin: 13.9 g/dL (ref 12.0–15.0)
Potassium: 3.9 mmol/L (ref 3.5–5.1)
Sodium: 138 mmol/L (ref 135–145)
TCO2: 29 mmol/L (ref 22–32)

## 2022-07-25 SURGERY — VULVECTOMY
Anesthesia: Monitor Anesthesia Care | Site: Vulva

## 2022-07-25 MED ORDER — BUPIVACAINE HCL (PF) 0.25 % IJ SOLN
INTRAMUSCULAR | Status: DC | PRN
  Administered 2022-07-25: 17 mL

## 2022-07-25 MED ORDER — ACETIC ACID 5 % SOLN
Status: DC | PRN
  Administered 2022-07-25: 1 via TOPICAL

## 2022-07-25 MED ORDER — FENTANYL CITRATE (PF) 100 MCG/2ML IJ SOLN
INTRAMUSCULAR | Status: DC | PRN
  Administered 2022-07-25: 25 ug via INTRAVENOUS

## 2022-07-25 MED ORDER — DEXAMETHASONE SODIUM PHOSPHATE 10 MG/ML IJ SOLN
INTRAMUSCULAR | Status: DC | PRN
  Administered 2022-07-25: 4 mg via INTRAVENOUS

## 2022-07-25 MED ORDER — 0.9 % SODIUM CHLORIDE (POUR BTL) OPTIME
TOPICAL | Status: DC | PRN
  Administered 2022-07-25: 500 mL

## 2022-07-25 MED ORDER — ACETAMINOPHEN 500 MG PO TABS
ORAL_TABLET | ORAL | Status: AC
  Filled 2022-07-25: qty 2

## 2022-07-25 MED ORDER — PROPOFOL 1000 MG/100ML IV EMUL
INTRAVENOUS | Status: AC
  Filled 2022-07-25: qty 100

## 2022-07-25 MED ORDER — PROPOFOL 500 MG/50ML IV EMUL
INTRAVENOUS | Status: DC | PRN
  Administered 2022-07-25: 125 ug/kg/min via INTRAVENOUS

## 2022-07-25 MED ORDER — DEXAMETHASONE SODIUM PHOSPHATE 10 MG/ML IJ SOLN: 4.0000 mg | INTRAMUSCULAR | Status: DC

## 2022-07-25 MED ORDER — ACETAMINOPHEN 500 MG PO TABS
1000.0000 mg | ORAL_TABLET | ORAL | Status: AC
  Administered 2022-07-25: 1000 mg via ORAL

## 2022-07-25 MED ORDER — ONDANSETRON HCL 4 MG/2ML IJ SOLN
INTRAMUSCULAR | Status: DC | PRN
  Administered 2022-07-25: 4 mg via INTRAVENOUS

## 2022-07-25 MED ORDER — LIDOCAINE HCL (CARDIAC) PF 100 MG/5ML IV SOSY
PREFILLED_SYRINGE | INTRAVENOUS | Status: DC | PRN
  Administered 2022-07-25: 40 mg via INTRAVENOUS

## 2022-07-25 MED ORDER — LACTATED RINGERS IV SOLN: INTRAVENOUS | Status: DC

## 2022-07-25 MED ORDER — FENTANYL CITRATE (PF) 100 MCG/2ML IJ SOLN
INTRAMUSCULAR | Status: AC
  Filled 2022-07-25: qty 2

## 2022-07-25 MED ORDER — PROPOFOL 10 MG/ML IV BOLUS
INTRAVENOUS | Status: DC | PRN
  Administered 2022-07-25 (×3): 10 mg via INTRAVENOUS

## 2022-07-25 SURGICAL SUPPLY — 15 items
CATH ROBINSON RED A/P 16FR (CATHETERS) ×1 IMPLANT
DRSG TELFA 3X8 NADH STRL (GAUZE/BANDAGES/DRESSINGS) IMPLANT
GAUZE 4X4 16PLY ~~LOC~~+RFID DBL (SPONGE) ×1 IMPLANT
GLOVE BIO SURGEON STRL SZ 6 (GLOVE) ×2 IMPLANT
GOWN STRL REUS W/TWL LRG LVL3 (GOWN DISPOSABLE) ×2 IMPLANT
KIT TURNOVER CYSTO (KITS) ×1 IMPLANT
NS IRRIG 1000ML POUR BTL (IV SOLUTION) ×1 IMPLANT
PACK VAGINAL WOMENS (CUSTOM PROCEDURE TRAY) ×1 IMPLANT
PAD OB MATERNITY 4.3X12.25 (PERSONAL CARE ITEMS) ×1 IMPLANT
PAD PREP 24X48 CUFFED NSTRL (MISCELLANEOUS) ×1 IMPLANT
SUT VIC AB 0 SH 27 (SUTURE) IMPLANT
SUT VIC AB 2-0 SH 27 (SUTURE) ×2
SUT VIC AB 2-0 SH 27XBRD (SUTURE) IMPLANT
SUT VICRYL 4-0 PS2 18IN ABS (SUTURE) IMPLANT
TOWEL OR 17X26 10 PK STRL BLUE (TOWEL DISPOSABLE) ×1 IMPLANT

## 2022-07-25 NOTE — Discharge Instructions (Addendum)
AFTER SURGERY INSTRUCTIONS   Return to work:  1-2 weeks if applicable   We recommend purchasing several bags of frozen green peas and dividing them into ziploc bags. You will want to keep these in the freezer and have them ready to use as ice packs to the vulvar incision. Once the ice pack is no longer cold, you can get another from the freezer. The frozen peas mold to your body better than a regular ice pack.    Activity: 1. Be up and out of the bed during the day.  Take a nap if needed.  You may walk up steps but be careful and use the hand rail.  Stair climbing will tire you more than you think, you may need to stop part way and rest.    2. No lifting or straining for 4 weeks over 10 pounds. No pushing, pulling, straining for 4 weeks.   3. No driving for minimum 24 hours after surgery.  Do not drive if you are taking narcotic pain medicine and make sure that your reaction time has returned.    4. You can shower as soon as the next day after surgery. Shower daily. No tub baths or submerging your body in water until cleared by your surgeon. If you have the soap that was given to you by pre-surgical testing that was used before surgery, you do not need to use it afterwards because this can irritate your incisions.    5. No sexual activity and nothing in the vagina for 4 weeks.   6. You may experience vaginal/vulvar spotting and discharge after surgery.  The spotting is normal but if you experience heavy bleeding, call our office.   7. Take Tylenol or ibuprofen first for pain if you are able to take these medications and only use narcotic pain medication for severe pain not relieved by the Tylenol or Ibuprofen.  Monitor your Tylenol intake to a max of 4,000 mg in a 24 hour period. You can alternate these medications after surgery.   Diet: 1. Low sodium Heart Healthy Diet is recommended but you are cleared to resume your normal (before surgery) diet after your procedure.   2. It is safe to use  a laxative, such as Miralax or Colace, if you have difficulty moving your bowels. You have been prescribed Sennakot at bedtime every evening to keep bowel movements regular and to prevent constipation.     Wound Care: 1. Keep clean and dry.  Shower daily.   Reasons to call the Doctor: Fever - Oral temperature greater than 100.4 degrees Fahrenheit Foul-smelling vaginal discharge Difficulty urinating Nausea and vomiting Increased pain at the site of the incision that is unrelieved with pain medicine. Difficulty breathing with or without chest pain New calf pain especially if only on one side Sudden, continuing increased vaginal bleeding with or without clots.   Contacts: For questions or concerns you should contact:   Dr. Bernadene Bell at [place], NP at [phone]   After Hours: call [phone] and have the GYN Oncologist paged/contacted (after 5 pm or on the weekends).   Messages sent via mychart are for non-urgent matters and are not responded to after hours so for urgent needs, please call the after hours number.     Post Anesthesia Home Care Instructions  Activity: Get plenty of rest for the remainder of the day. A responsible individual must stay with you for 24 hours following the procedure.  For the next 24 hours,  DO NOT: -Drive a car -Paediatric nurse -Drink alcoholic beverages -Take any medication unless instructed by your physician -Make any legal decisions or sign important papers.  Meals: Start with liquid foods such as gelatin or soup. Progress to regular foods as tolerated. Avoid greasy, spicy, heavy foods. If nausea and/or vomiting occur, drink only clear liquids until the nausea and/or vomiting subsides. Call your physician if vomiting continues.  Special Instructions/Symptoms: Your throat may feel dry or sore from the anesthesia or the breathing tube placed in your throat during surgery. If this causes discomfort, gargle with warm  salt water. The discomfort should disappear within 24 hours.  Do not take any Tylenol until after 5:15 pm today if needed.

## 2022-07-25 NOTE — Interval H&P Note (Signed)
History and Physical Interval Note:  07/25/2022 11:37 AM  [name]  has presented today for surgery, with the diagnosis of VIN3.  The various methods of treatment have been discussed with the patient and family. After consideration of risks, benefits and other options for treatment, the patient has consented to  Procedure(s): VULVECTOMY (N/A) as a surgical intervention.  The patient's history has been reviewed, patient examined, no change in status, stable for surgery.  I have reviewed the patient's chart and labs.  Questions were answered to the patient's satisfaction.     [name]

## 2022-07-25 NOTE — Telephone Encounter (Signed)
Please get auth for euflexxa left knee-blackman pt

## 2022-07-25 NOTE — Transfer of Care (Signed)
Immediate Anesthesia Transfer of Care Note  Patient: [name]  Procedure(s) Performed: Eugenie Norrie (Vulva)  Patient Location: PACU  Anesthesia Type:MAC  Level of Consciousness: awake, alert , oriented and patient cooperative  Airway & Oxygen Therapy: Patient Spontanous Breathing  Post-op Assessment: Report given to RN and Post -op Vital signs reviewed and stable  Post vital signs: Reviewed and stable  Last Vitals:  Vitals Value Taken Time  BP 104/65 07/25/22 1303  Temp    Pulse 69 07/25/22 1308  Resp 15 07/25/22 1308  SpO2 95 % 07/25/22 1308  Vitals shown include unvalidated device data.  Last Pain:  Vitals:   07/25/22 1047  TempSrc: Oral  PainSc: 0-No pain      Patients Stated Pain Goal: 4 (93/81/01 7510)  Complications: No notable events documented.

## 2022-07-25 NOTE — Op Note (Addendum)
GYNECOLOGIC ONCOLOGY OPERATIVE NOTE  Date of Service: 07/25/2022  Preoperative Diagnosis: VIN3  Postoperative Diagnosis: Same  Procedures: Procedure(s): VULVECTOMY  Surgeon: Bernadene Bell, MD  Assistants: Lahoma Crocker, MD  Anesthesia: Choice  Estimated Blood Loss: Minimal   Fluids: 500 ml, crystalloid  Urine Output: None (voided prior to OR)  Findings: External genitalia with normal bilateral labia majora and minora.  Posterior fourchette at junction of perineal body and vaginal mucosa, evidence of prior biopsy, healing. With acetic acid applied, aceto white changes at this same location, extending <1cm posteriorly onto perineum and milder changes extending to bilateral posterior labial minora.  Specimens:  ID Type Source Tests Collected by Time Destination  1 : six o'clock margin of vulva Tissue PATH Gyn biopsy SURGICAL PATHOLOGY Bernadene Bell, MD 07/25/2022 1226   2 : right posterior of vulva Tissue PATH Gyn biopsy SURGICAL PATHOLOGY Bernadene Bell, MD 07/25/2022 1227   3 : left posterior of vulva Tissue PATH Gyn biopsy SURGICAL PATHOLOGY Bernadene Bell, MD 7/82/9562 1308     Complications:  None  Indications for Procedure: [name] is a 81 y.o. woman with VIN3.  Prior to the procedure, all risks, benefits, and alternatives were discussed and informed surgical consent was signed.  Procedure: Patient was taken to the operating room where general anesthesia was achieved.  She was positioned in dorsal lithotomy and prepped and draped.  Patient voided prior to OR.  A thorough exam of the vulva was done after placing a moist sponge with acetic acid on the vulva with the findings as noted above. The area to be removed was outlined with a marking pen. 0.25% marcaine was injected. A scalpel was used to incise around the lesion. Allis clamps are used to grasp the lesion and a skinning vulvectomy was performed in the standard fashion with aid of cautery. With gentle  traction with allis clamp, specimen, disrupted in parts. After the lesion was removed, the wound bed was made hemostatic with cautery. Several deep 2-0 vicryl suture was placed to bring the wound edges together followed by 4-0 vicryl mattress sutures on the skin edges to re-appoximate the wound. Hemostasis was noted at the end of the procedure.   Patient tolerated the procedure well. Sponge, lap, and instrument counts were correct.  No perioperative antibiotics were indicated for this procedure.  Patient was extubated and taken to the PACU in stable condition.

## 2022-07-25 NOTE — Anesthesia Postprocedure Evaluation (Signed)
Anesthesia Post Note  Patient: [name]  Procedure(s) Performed: VULVECTOMY (Vulva)     Patient location during evaluation: PACU Anesthesia Type: MAC Level of consciousness: awake Pain management: pain level controlled Vital Signs Assessment: post-procedure vital signs reviewed and stable Respiratory status: spontaneous breathing, nonlabored ventilation, respiratory function stable and patient connected to nasal cannula oxygen Cardiovascular status: stable and blood pressure returned to baseline Postop Assessment: no apparent nausea or vomiting Anesthetic complications: no   No notable events documented.  Last Vitals:  Vitals:   07/25/22 1330 07/25/22 1414  BP: 133/68 (!) 147/75  Pulse: (!) 59 62  Resp: 12 16  Temp: (!) 36.4 C (!) 36.4 C  SpO2: 99% 98%    Last Pain:  Vitals:   07/25/22 1414  TempSrc: Axillary  PainSc: 2                  Aarya Quebedeaux P Izael Bessinger

## 2022-07-25 NOTE — Anesthesia Preprocedure Evaluation (Addendum)
Anesthesia Evaluation  Patient identified by MRN, date of birth, ID band Patient awake    Reviewed: Allergy & Precautions, NPO status , Patient's Chart, lab work & pertinent test results  Airway Mallampati: II  TM Distance: >3 FB Neck ROM: Full    Dental no notable dental hx.    Pulmonary former smoker,    Pulmonary exam normal        Cardiovascular hypertension, Pt. on medications Normal cardiovascular exam     Neuro/Psych PSYCHIATRIC DISORDERS Anxiety Depression negative neurological ROS     GI/Hepatic Neg liver ROS, GERD  Controlled,  Endo/Other  negative endocrine ROS  Renal/GU negative Renal ROS     Musculoskeletal  (+) Arthritis , Chronic low back pain   Abdominal   Peds  Hematology negative hematology ROS (+)   Anesthesia Other Findings VIN3  Reproductive/Obstetrics                            Anesthesia Physical Anesthesia Plan  ASA: 2  Anesthesia Plan: MAC   Post-op Pain Management:    Induction: Intravenous  PONV Risk Score and Plan: 2 and Ondansetron, Dexamethasone, Propofol infusion and Treatment may vary due to age or medical condition  Airway Management Planned: Simple Face Mask  Additional Equipment:   Intra-op Plan:   Post-operative Plan:   Informed Consent: I have reviewed the patients History and Physical, chart, labs and discussed the procedure including the risks, benefits and alternatives for the proposed anesthesia with the patient or authorized representative who has indicated his/her understanding and acceptance.     Dental advisory given  Plan Discussed with: CRNA  Anesthesia Plan Comments:         Anesthesia Quick Evaluation

## 2022-07-26 ENCOUNTER — Telehealth: Payer: Self-pay

## 2022-07-26 ENCOUNTER — Encounter (HOSPITAL_BASED_OUTPATIENT_CLINIC_OR_DEPARTMENT_OTHER): Payer: Self-pay | Admitting: Psychiatry

## 2022-07-26 DIAGNOSIS — L570 Actinic keratosis: Secondary | ICD-10-CM | POA: Diagnosis not present

## 2022-07-26 NOTE — Telephone Encounter (Signed)
Spoke with Ms. Exline this morning. She states she is eating, drinking and urinating well. She has had a small BM and is passing gas. She is taking senokot as prescribed and encouraged her to drink plenty of water. She denies fever or chills. Incision is dry and intact. She rates her pain 3/10. Her pain is controlled with Tylenol and Motrin.    Instructed to call office with any fever, chills, purulent drainage, uncontrolled pain or any other questions or concerns. Patient verbalizes understanding.   Encouraged pt to review post op instructions.  Pt aware of post op appointments as well as the office number [phone] and after hours number [phone] to call if she has any questions or concerns

## 2022-07-27 LAB — SURGICAL PATHOLOGY

## 2022-07-28 NOTE — Telephone Encounter (Signed)
VOB submitted for Euflexxa, left knee  

## 2022-08-01 ENCOUNTER — Ambulatory Visit: Payer: PRIVATE HEALTH INSURANCE | Admitting: Gynecologic Oncology

## 2022-08-01 ENCOUNTER — Inpatient Hospital Stay: Payer: Medicare Other | Attending: Psychiatry | Admitting: Psychiatry

## 2022-08-01 ENCOUNTER — Encounter: Payer: Self-pay | Admitting: Psychiatry

## 2022-08-01 DIAGNOSIS — Z9079 Acquired absence of other genital organ(s): Secondary | ICD-10-CM | POA: Diagnosis not present

## 2022-08-01 DIAGNOSIS — N903 Dysplasia of vulva, unspecified: Secondary | ICD-10-CM | POA: Diagnosis not present

## 2022-08-01 DIAGNOSIS — Z7189 Other specified counseling: Secondary | ICD-10-CM

## 2022-08-01 NOTE — Progress Notes (Signed)
Gynecologic Oncology Telehealth Note  I connected with NIMAH UPHOFF on 08/01/22 at  4:20 PM EDT by telephone and verified that I am speaking with the correct person using two identifiers.  I discussed the limitations, risks, security and privacy concerns of performing an evaluation and management service by telemedicine and the availability of in-person appointments. I also discussed with the patient that there may be a patient responsible charge related to this service. The patient expressed understanding and agreed to proceed.  Other persons participating in the visit and their role in the encounter: none.  Patient's location: Home Provider's location: [facility]  Date of Service: 08/01/2022 Referring Provider: Ma Hillock, FNP  Assessment & Plan: RYLEAH MIRAMONTES is a 81 y.o. woman with VIN 3, 1 week status post a simple partial vulvectomy.  She is recovering well from surgery.  Her surgical pathology results were reviewed in detail.  Reviewed the nature of vulvar dysplasia and that this can be a recurring problem.  Fortunately, no invasive cancer noted on specimen and margins free from high-grade dysplasia, although low-grade dysplasia present on margins.  Ongoing precautions reviewed.  Patient plans to resume walking.  She would also like to use 5 pound weights for arm exercises which I note is okay for her to do.  Otherwise, we reviewed that ongoing surveillance for high-grade vulvar dysplasia would include thorough pelvic exams every 6 months, at least initially.  Also reviewed signs and symptoms of recurrent disease including itching, leading, new bump or lesion.  If she were to notice any of the symptoms, we would evaluate sooner.  RTC 2 weeks for in person postop check followed by 83-monthreturn visit.  MBernadene Bell MD Gynecologic Oncology   Medical Decision Making I personally spent  TOTAL 20 minutes face-to-face and non-face-to-face in the care of this patient, which  includes all pre, intra, and post visit time on the date of service.  2 minutes spent reviewing records prior to the visit 11 Minutes in patient contact 7 minutes charting , conferring with consultants etc.   ----------------------- Reason for Visit: Telephone postop check  Interval History: Patient reports that she is overall doing well.  She has not needed the tramadol.  She otherwise is using Tylenol as needed.  Her primary symptom she has noted that is fatigue.  She is otherwise eating and drinking well and having regular bowel movements without straining.  She feels that the stool softener is working well.  She notes that she is keeping her vulva clean without issue.  She feels ready to get back to exercising.   Past Medical/Surgical History: Past Medical History:  Diagnosis Date   Anxiety    Chronic low back pain    Complication of anesthesia    pt stated  "i do not like general anesthesia , it made me feel loopy for two weeks"  pt stated this happended with appendecotmy in 2010   DDD (degenerative disc disease), lumbosacral    Depression    GERD (gastroesophageal reflux disease)    History of kidney stones 1988   x1 episode passed on own   History of nonmelanoma skin cancer    SCC/ BCC   s/p  several excision of both   Hypertension    OA (osteoarthritis)    Osteopenia    Vulvar intraepithelial neoplasia (VIN) grade 3    Wears glasses    Wears hearing aid in both ears     Past Surgical History:  Procedure Laterality Date  BREAST EXCISIONAL BIOPSY Left 1961   benign   CATARACT EXTRACTION W/ INTRAOCULAR LENS IMPLANT Bilateral 2020   LAPAROSCOPIC ABDOMINAL EXPLORATION  12/15/2008   '@[facility]'$  by dr gerkin;   ABORTED TO EXPLORATORY LAPAROTOMY W/ APPENDECTOMY (RUPUTED)   TOTAL HIP ARTHROPLASTY  04/19/2012   Procedure: TOTAL HIP ARTHROPLASTY ANTERIOR APPROACH;  Surgeon: Mcarthur Rossetti, MD;  Location: [facility];  Service: Orthopedics;  Laterality: Left;  Left Total Hip  Arthroplasty   TOTAL HIP ARTHROPLASTY Right 04/21/2016   Procedure: RIGHT TOTAL HIP ARTHROPLASTY ANTERIOR APPROACH;  Surgeon: Mcarthur Rossetti, MD;  Location: [facility];  Service: Orthopedics;  Laterality: Right;   TOTAL KNEE ARTHROPLASTY Right 12/20/2018   Procedure: RIGHT TOTAL KNEE ARTHROPLASTY;  Surgeon: Mcarthur Rossetti, MD;  Location: [facility];  Service: Orthopedics;  Laterality: Right;   VULVECTOMY N/A 07/25/2022   Procedure: Eugenie Norrie;  Surgeon: Bernadene Bell, MD;  Location: [facility];  Service: Gynecology;  Laterality: N/A;    Family History  Problem Relation Age of Onset   Cancer Father        prostate   Hypertension Father    Heart attack Father    Cancer Brother        pancreatic/liver   Cancer Maternal Grandmother        throat   Breast cancer Paternal Grandmother    Ovarian cancer Neg Hx    Uterine cancer Neg Hx     Social History   Socioeconomic History   Marital status: Divorced    Spouse name: Not on file   Number of children: Not on file   Years of education: Not on file   Highest education level: Not on file  Occupational History   Not on file  Tobacco Use   Smoking status: Former    Years: 29.00    Types: Cigarettes    Quit date: 04/15/1986    Years since quitting: 36.3   Smokeless tobacco: Never  Vaping Use   Vaping Use: Never used  Substance and Sexual Activity   Alcohol use: Yes    Comment: occasional   Drug use: Never   Sexual activity: Not on file  Other Topics Concern   Not on file  Social History Narrative   Not on file   Social Determinants of Health   Financial Resource Strain: Not on file  Food Insecurity: Not on file  Transportation Needs: Not on file  Physical Activity: Not on file  Stress: Not on file  Social Connections: Not on file    Current Medications:  Current Outpatient Medications:    acetaminophen (TYLENOL) 325 MG tablet, every 6 (six) hours as needed., Disp: , Rfl:    ALPRAZolam  (XANAX) 0.25 MG tablet, Take 0.125 mg by mouth as needed for anxiety. , Disp: , Rfl:    atorvastatin (LIPITOR) 10 MG tablet, Take 10 mg by mouth daily at 6 PM., Disp: , Rfl: 11   Calcium Carb-Cholecalciferol (CALCIUM + D3 PO), Take 1 tablet by mouth daily., Disp: , Rfl:    calcium carbonate (TUMS - DOSED IN MG ELEMENTAL CALCIUM) 500 MG chewable tablet, Chew 1 tablet by mouth 2 (two) times daily as needed for indigestion or heartburn., Disp: , Rfl:    celecoxib (CELEBREX) 200 MG capsule, TAKE (1) CAPSULE DAILY. (Patient taking differently: Take 200 mg by mouth daily as needed for moderate pain.), Disp: 30 capsule, Rfl: 2   citalopram (CELEXA) 40 MG tablet, Take 20 mg by mouth at bedtime. , Disp: ,  Rfl:    Difluprednate 0.05 % EMUL, Apply 1 drop to eye 3 (three) times daily as needed., Disp: , Rfl:    docusate sodium (COLACE) 100 MG capsule, Take 100 mg by mouth daily as needed for mild constipation., Disp: , Rfl:    hydrochlorothiazide (HYDRODIURIL) 12.5 MG tablet, Take 12.5 mg by mouth every morning., Disp: , Rfl: 11   Multiple Vitamin (MULTIVITAMIN WITH MINERALS) TABS, Take 1 tablet by mouth daily., Disp: , Rfl:    Polyethyl Glycol-Propyl Glycol (SYSTANE OP), Place 1 drop into both eyes daily as needed (For dry eyes.). , Disp: , Rfl:    PREMARIN vaginal cream, Place vaginally 2 (two) times a week., Disp: , Rfl:    senna-docusate (SENOKOT-S) 8.6-50 MG tablet, Take 2 tablets by mouth at bedtime. For AFTER surgery, do not take if having diarrhea (Patient not taking: Reported on 07/18/2022), Disp: 30 tablet, Rfl: 0   traMADol (ULTRAM) 50 MG tablet, Take 1 tablet (50 mg total) by mouth every 6 (six) hours as needed for severe pain. For AFTER surgery, do not take and drive (Patient not taking: Reported on 07/18/2022), Disp: 10 tablet, Rfl: 0  Review of Symptoms: Pertinent positives as per HPI.  Physical Exam: Deferred given limitations of phone visit.  Laboratory & Radiologic Studies: SURGICAL  PATHOLOGY  CASE: [id number]  PATIENT: Flora Neubauer  Surgical Pathology Report  FINAL MICROSCOPIC DIAGNOSIS:   A. VULVA, 6 OCLOCK, MARGIN, BIOPSY:  - Low-grade squamous intraepithelial lesion (VIN1, low grade dysplasia)  - Low-grade dysplasia is present at the tissue resection edge   B. VULVA, RIGHT POSTERIOR, BIOPSY:  - Low-grade squamous intraepithelial lesion (VIN1, low grade dysplasia)  - Low-grade dysplasia is present at the tissue resection edge   C. 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

## 2022-08-02 ENCOUNTER — Telehealth: Payer: Self-pay

## 2022-08-02 ENCOUNTER — Other Ambulatory Visit: Payer: Self-pay | Admitting: Gynecologic Oncology

## 2022-08-02 DIAGNOSIS — B3731 Acute candidiasis of vulva and vagina: Secondary | ICD-10-CM

## 2022-08-02 MED ORDER — CLOTRIMAZOLE-BETAMETHASONE 1-0.05 % EX CREA: 1.0000 | TOPICAL_CREAM | Freq: Two times a day (BID) | CUTANEOUS | 0 refills | Status: DC

## 2022-08-02 MED ORDER — FLUCONAZOLE 150 MG PO TABS: 150.0000 mg | ORAL_TABLET | Freq: Once | ORAL | 0 refills | Status: AC

## 2022-08-02 NOTE — Telephone Encounter (Signed)
Pt called stating she spoke to Dr. Ernestina Patches yesterday and wanted to update her on a symptom she is having. She states she is having itching on the outside of the vagina with sm amount of white discharge. Also has noticed itching in the crease of legs. No odor, no pain/pressure/burning or urgency symptoms, no pelvic pain, no fever/chills. No bleeding. She states she has been trying to keep everything dry as possible. She has not changed soaps/laundry detergent.   Pt aware I will notify Dr. Ernestina Patches and call her back with advise. She voiced an understanding.

## 2022-08-02 NOTE — Telephone Encounter (Signed)
Pt states she has not looked but it does feel bumpy. The incision is dry and intact, no redness or oozing. She is aware of the Diflucan being sent to the pharmacy. She has Hydrocortisone cream at the house and will use it. Also, advised her to try and keep area dry as possible, wear loose fitting clothing/no panties when able. She voiced an understanding and states she will call back on Friday with an update.

## 2022-08-10 ENCOUNTER — Telehealth: Payer: Self-pay

## 2022-08-10 NOTE — Telephone Encounter (Signed)
Uploaded office notes online through Euflexxa portal to start PA for Eulfexxa, left knee. PA pending

## 2022-08-13 NOTE — Progress Notes (Unsigned)
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  - Pt recovering well from surgery and healing appropriately postoperatively - Intraoperative findings and pathology results reviewed: VIN3. - Reviewed the nature of vulvar dysplasia and recommend ongoing surveillance with exams every 6 months. - Also reviewed signs and symptoms of recurrent disease including itching, bleeding, new bump or lesion.  If she were to notice any of the symptoms, we would evaluate sooner. - Ongoing postoperative expectations and precautions reviewed. Continue with no lifting >10lbs through 4 weeks postoperatively -Okay to return to work.  RTC 6 months.  Bernadene Bell, MD Gynecologic Oncology   Medical Decision Making I personally spent  TOTAL 21 minutes face-to-face and non-face-to-face in the care of this patient, which includes all pre, intra, and post visit time on the date of service.  3 minutes spent reviewing records prior to the visit 15 Minutes in patient contact 3 minutes charting , conferring with consultants etc.   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  She was using Premarin twice weekly internally and externally, and was also prescribed clobetasol ointment for the lesion. Given persistent symptoms, lesion,  on 07/05/2022 she underwent an exam which noted a white plaque at the fourchette tender to palpation, and a biopsy was obtained, which resulted with VIN 3. Pt underwent a simple partial vulvectomy on 07/25/22 with final pathology showing VIN3, negatives positive for VIN1 but negative for VIN3.  Interval History: Pt reports that she is  recovering well from surgery. She is using no longer needing any pain meds for pain.  She reports that she never took any of the tramadol.  She is eating and drinking well. She is voiding without issue and having regular bowel movements.  She does report some ongoing vulvar itching.  She has returned to work without issue.   Past Medical/Surgical History: Past Medical History:  Diagnosis Date   Anxiety    Chronic low back pain    Complication of anesthesia    pt stated  "i do not like general anesthesia , it made me feel loopy for two weeks"  pt stated this happended with appendecotmy in 2010   DDD (degenerative disc disease), lumbosacral    Depression    GERD (gastroesophageal reflux disease)    History of kidney stones 1988   x1 episode passed on own   History of nonmelanoma skin cancer    SCC/ BCC   s/p  several excision of both   Hypertension    OA (osteoarthritis)    Osteopenia    Vulvar intraepithelial neoplasia (VIN) grade 3    Wears glasses    Wears hearing aid in both ears     Past Surgical History:  Procedure Laterality Date   BREAST EXCISIONAL BIOPSY Left 1961   benign   CATARACT EXTRACTION W/ INTRAOCULAR LENS IMPLANT Bilateral 2020   LAPAROSCOPIC ABDOMINAL EXPLORATION  12/15/2008   '@[facility]'$  by dr gerkin;   ABORTED TO EXPLORATORY LAPAROTOMY W/ APPENDECTOMY (RUPUTED)   TOTAL HIP ARTHROPLASTY  04/19/2012   Procedure: TOTAL HIP ARTHROPLASTY ANTERIOR APPROACH;  Surgeon: Mcarthur Rossetti, MD;  Location: [facility];  Service: Orthopedics;  Laterality: Left;  Left Total Hip Arthroplasty  TOTAL HIP ARTHROPLASTY Right 04/21/2016   Procedure: RIGHT TOTAL HIP ARTHROPLASTY ANTERIOR APPROACH;  Surgeon: Mcarthur Rossetti, MD;  Location: [facility];  Service: Orthopedics;  Laterality: Right;   TOTAL KNEE ARTHROPLASTY Right 12/20/2018   Procedure: RIGHT TOTAL KNEE ARTHROPLASTY;  Surgeon: Mcarthur Rossetti, MD;  Location: [facility];  Service: Orthopedics;  Laterality: Right;    VULVECTOMY N/A 07/25/2022   Procedure: Eugenie Norrie;  Surgeon: Bernadene Bell, MD;  Location: [facility];  Service: Gynecology;  Laterality: N/A;    Family History  Problem Relation Age of Onset   Cancer Father        prostate   Hypertension Father    Heart attack Father    Cancer Brother        pancreatic/liver   Cancer Maternal Grandmother        throat   Breast cancer Paternal Grandmother    Ovarian cancer Neg Hx    Uterine cancer Neg Hx     Social History   Socioeconomic History   Marital status: Divorced    Spouse name: Not on file   Number of children: Not on file   Years of education: Not on file   Highest education level: Not on file  Occupational History   Not on file  Tobacco Use   Smoking status: Former    Years: 29.00    Types: Cigarettes    Quit date: 04/15/1986    Years since quitting: 36.3   Smokeless tobacco: Never  Vaping Use   Vaping Use: Never used  Substance and Sexual Activity   Alcohol use: Yes    Comment: occasional   Drug use: Never   Sexual activity: Not on file  Other Topics Concern   Not on file  Social History Narrative   Not on file   Social Determinants of Health   Financial Resource Strain: Not on file  Food Insecurity: Not on file  Transportation Needs: Not on file  Physical Activity: Not on file  Stress: Not on file  Social Connections: Not on file    Current Medications:  Current Outpatient Medications:    acetaminophen (TYLENOL) 325 MG tablet, every 6 (six) hours as needed., Disp: , Rfl:    ALPRAZolam (XANAX) 0.25 MG tablet, Take 0.125 mg by mouth as needed for anxiety. , Disp: , Rfl:    atorvastatin (LIPITOR) 10 MG tablet, Take 10 mg by mouth daily at 6 PM., Disp: , Rfl: 11   Calcium Carb-Cholecalciferol (CALCIUM + D3 PO), Take 1 tablet by mouth daily., Disp: , Rfl:    calcium carbonate (TUMS - DOSED IN MG ELEMENTAL CALCIUM) 500 MG chewable tablet, Chew 1 tablet by mouth 2 (two) times daily as needed  for indigestion or heartburn., Disp: , Rfl:    celecoxib (CELEBREX) 200 MG capsule, TAKE (1) CAPSULE DAILY. (Patient taking differently: Take 200 mg by mouth daily as needed for moderate pain.), Disp: 30 capsule, Rfl: 2   citalopram (CELEXA) 40 MG tablet, Take 20 mg by mouth at bedtime. , Disp: , Rfl:    clotrimazole-betamethasone (LOTRISONE) cream, Apply 1 Application topically 2 (two) times daily. Apply to groin for itching, Disp: 30 g, Rfl: 0   Difluprednate 0.05 % EMUL, Apply 1 drop to eye 3 (three) times daily as needed., Disp: , Rfl:    docusate sodium (COLACE) 100 MG capsule, Take 100 mg by mouth daily as needed for mild constipation., Disp: , Rfl:    hydrochlorothiazide (HYDRODIURIL) 12.5 MG tablet, Take 12.5  mg by mouth every morning., Disp: , Rfl: 11   Multiple Vitamin (MULTIVITAMIN WITH MINERALS) TABS, Take 1 tablet by mouth daily., Disp: , Rfl:    Polyethyl Glycol-Propyl Glycol (SYSTANE OP), Place 1 drop into both eyes daily as needed (For dry eyes.). , Disp: , Rfl:    PREMARIN vaginal cream, Place vaginally 2 (two) times a week., Disp: , Rfl:   Review of Symptoms: Complete 10-system review is negative except as above in Interval History.  Physical Exam: BP 138/62 (BP Location: Left Arm, Patient Position: Sitting)   Pulse 62   Temp 98.4 F (36.9 C) (Oral)   Resp 18   Wt 158 lb (71.7 kg)   LMP 10/31/1991   BMI 25.50 kg/m  General: Alert, oriented, no acute distress. HEENT: Normocephalic, atraumatic. Neck symmetric without masses.  Chest: Normal work of breathing.  Extremities: Grossly normal range of motion.  Warm, well perfused.  No edema bilaterally. Skin: No rashes or lesions noted. GU: Normal appearing external genitalia with well hearing posterior partial vulvectomy.  Stitches still present but dissolving.  Minimal separation in the midline posteriorly at the junction with the vaginal mucosa.  No erythema or drainage.  Confirmed on single digit vaginal exam.  Exam  chaperoned by Joylene John, NP   Laboratory & Radiologic Studies: Surgical pathology (07/25/22):  SURGICAL PATHOLOGY  CASE: [id number]  PATIENT: Tziporah Gloster  Surgical Pathology Report    Clinical History: VIN 3 (crm)    FINAL MICROSCOPIC DIAGNOSIS:   A. VULVA, 6 OCLOCK, MARGIN, BIOPSY:  - Low-grade squamous intraepithelial lesion (VIN1, low grade dysplasia)  - Low-grade dysplasia is present at the tissue resection edge   B. VULVA, RIGHT POSTERIOR, BIOPSY:  - Low-grade squamous intraepithelial lesion (VIN1, low grade dysplasia)  - Low-grade dysplasia is present at the tissue resection edge   C. 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

## 2022-08-14 ENCOUNTER — Other Ambulatory Visit: Payer: Self-pay

## 2022-08-14 ENCOUNTER — Encounter: Payer: Self-pay | Admitting: Psychiatry

## 2022-08-14 ENCOUNTER — Inpatient Hospital Stay (HOSPITAL_BASED_OUTPATIENT_CLINIC_OR_DEPARTMENT_OTHER): Payer: Medicare Other | Admitting: Psychiatry

## 2022-08-14 VITALS — BP 138/62 | HR 62 | Temp 98.4°F | Resp 18 | Wt 158.0 lb

## 2022-08-14 DIAGNOSIS — Z9079 Acquired absence of other genital organ(s): Secondary | ICD-10-CM

## 2022-08-14 DIAGNOSIS — N903 Dysplasia of vulva, unspecified: Secondary | ICD-10-CM

## 2022-08-14 DIAGNOSIS — D071 Carcinoma in situ of vulva: Secondary | ICD-10-CM

## 2022-08-14 NOTE — Patient Instructions (Signed)
It was a pleasure to see you in clinic today. -You are healing well from surgery - You may resume your vaginal estrogen - Return visit planned for 6 months  Thank you very much for allowing me to provide care for you today.  I appreciate your confidence in choosing our Gynecologic Oncology team at [facility].  If you have any questions about your visit today please call our office or send [place] a MyChart message and we will get back to you as soon as possible.

## 2022-08-15 ENCOUNTER — Telehealth: Payer: Self-pay

## 2022-08-15 NOTE — Telephone Encounter (Signed)
PA was denied per [employer]. Preferred products are Duorlane, Gelsyn-3, Synvisc/SynviscOne.  Faxed office notes to [facility] for an appeal due to patient having an allergic reaction to Synvisc.  Call reference# [id number]

## 2022-08-21 ENCOUNTER — Telehealth: Payer: Self-pay | Admitting: Orthopaedic Surgery

## 2022-08-21 DIAGNOSIS — L814 Other melanin hyperpigmentation: Secondary | ICD-10-CM | POA: Diagnosis not present

## 2022-08-21 DIAGNOSIS — L821 Other seborrheic keratosis: Secondary | ICD-10-CM | POA: Diagnosis not present

## 2022-08-21 DIAGNOSIS — L578 Other skin changes due to chronic exposure to nonionizing radiation: Secondary | ICD-10-CM | POA: Diagnosis not present

## 2022-08-21 DIAGNOSIS — D485 Neoplasm of uncertain behavior of skin: Secondary | ICD-10-CM | POA: Diagnosis not present

## 2022-08-21 DIAGNOSIS — L570 Actinic keratosis: Secondary | ICD-10-CM | POA: Diagnosis not present

## 2022-08-21 DIAGNOSIS — Z85828 Personal history of other malignant neoplasm of skin: Secondary | ICD-10-CM | POA: Diagnosis not present

## 2022-08-21 NOTE — Telephone Encounter (Signed)
Latitia called from [place]. Asking for a caqll back concerning medicatrion questions. [employer] asking for clarification with clinical explation as to why pt hasn't tried orthovisc first. Dr. Ninfa Linden sent in [place]. Please call Latitia at [phone].

## 2022-08-22 NOTE — Telephone Encounter (Signed)
Called and left a VM for [name] to call me back concerning gel injection brand for patient.  Will submit for Orthovisc.

## 2022-08-30 ENCOUNTER — Other Ambulatory Visit: Payer: Self-pay

## 2022-08-30 DIAGNOSIS — M1712 Unilateral primary osteoarthritis, left knee: Secondary | ICD-10-CM

## 2022-09-07 ENCOUNTER — Ambulatory Visit (INDEPENDENT_AMBULATORY_CARE_PROVIDER_SITE_OTHER): Payer: Medicare Other | Admitting: Physician Assistant

## 2022-09-07 ENCOUNTER — Encounter: Payer: Self-pay | Admitting: Physician Assistant

## 2022-09-07 DIAGNOSIS — M1712 Unilateral primary osteoarthritis, left knee: Secondary | ICD-10-CM

## 2022-09-07 NOTE — Telephone Encounter (Signed)
Pt came in today to start her Orthovisc injection. She advised me that she has had a reaction to rooster combs. After looking further examination of the orthovisc medication by Artis Delay deemed it was too risky to give her the orthovisc inj. He would like pt to have the euflexxa injs. Please submit an appeal

## 2022-09-08 NOTE — Telephone Encounter (Signed)
I resubmitted for Euflexxa, I will submit the appeal once I receive VOB.

## 2022-09-14 ENCOUNTER — Telehealth: Payer: Self-pay | Admitting: Physician Assistant

## 2022-09-14 ENCOUNTER — Ambulatory Visit: Payer: PRIVATE HEALTH INSURANCE | Admitting: Physician Assistant

## 2022-09-14 ENCOUNTER — Telehealth: Payer: Self-pay

## 2022-09-14 NOTE — Telephone Encounter (Signed)
PA is still pending through [facility] for euflexxa, left knee.

## 2022-09-14 NOTE — Telephone Encounter (Signed)
Pt called today for advise, she is having discomfort on her vulvar around the surgery site. No bleeding/pain. No fever/chills no swelling.She states it feels like "an Estate manager/land agent" is in that area. Grainy feeling. Has used Cortizone cream but nothing else  She has also noticed a little vaginal itching, no discharge/no odor. When asked if it was like a yeast infection she states "it's hard to be objective when you have had cancer" She is wondering if she needs to be seen, given her history.  Pt aware I will notify Dr.Newton.

## 2022-09-15 ENCOUNTER — Telehealth: Payer: Self-pay

## 2022-09-15 NOTE — Telephone Encounter (Signed)
Pt has an allergy risk for the orthovisc. Please submit an appeal. Thank you

## 2022-09-15 NOTE — Telephone Encounter (Signed)
I need an appeal letter from Dr. Ninfa Linden or Artis Delay, I have already advised [employer] that she has an allergic reaction to SynviscOne and Orthovisc.

## 2022-09-15 NOTE — Telephone Encounter (Signed)
Received a fax from [facility] stating that [place] was denied for left knee due to not trying/failing two preferred drugs.  Patient has only tried one preferred drug which was SynviscOne and the other option would be Orthovisc.    We are able to appeal this decision, but will need an appeal letter to send to [facility] as to why patient is not able to try Orthovisc.  Please advise.  Thank youl

## 2022-09-18 ENCOUNTER — Inpatient Hospital Stay: Payer: Medicare Other | Attending: Psychiatry | Admitting: Psychiatry

## 2022-09-18 ENCOUNTER — Other Ambulatory Visit: Payer: Self-pay

## 2022-09-18 VITALS — BP 134/59 | HR 69 | Temp 98.1°F | Resp 16 | Ht 65.0 in | Wt 153.0 lb

## 2022-09-18 DIAGNOSIS — D071 Carcinoma in situ of vulva: Secondary | ICD-10-CM

## 2022-09-18 DIAGNOSIS — L292 Pruritus vulvae: Secondary | ICD-10-CM

## 2022-09-18 DIAGNOSIS — Z9079 Acquired absence of other genital organ(s): Secondary | ICD-10-CM

## 2022-09-18 NOTE — Progress Notes (Unsigned)
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  Postop: - Pt recovering well from surgery and healing appropriately postoperatively - Intraoperative findings and pathology results reviewed: VIN3. - Reviewed the nature of vulvar dysplasia and recommend ongoing surveillance with exams every 6 months. - Also reviewed signs and symptoms of recurrent disease including itching, bleeding, new bump or lesion.  If she were to notice any of the symptoms, we would evaluate sooner. - Ongoing postoperative expectations and precautions reviewed. Continue with no lifting >10lbs through 4 weeks postoperatively -Okay to return to work. ***   RTC 6 months.  Bernadene Bell, MD Gynecologic Oncology   Medical Decision Making I personally spent  TOTAL 21 minutes face-to-face and non-face-to-face in the care of this patient, which includes all pre, intra, and post visit time on the date of service.  3 minutes spent reviewing records prior to the visit 15 Minutes in patient contact 3 minutes charting , conferring with consultants etc.   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  She was using Premarin twice weekly internally and externally, and was also prescribed clobetasol ointment for the lesion. Given persistent symptoms, lesion,  on 07/05/2022 she underwent an exam which noted a white plaque at the fourchette tender to palpation, and a biopsy was obtained, which resulted with VIN 3. Pt underwent a simple partial vulvectomy on 07/25/22 with final pathology showing VIN3, negatives positive for VIN1 but negative for VIN3.  Interval  History: *** Itching - resumed soon after surgery, persistent Rough sensation Some discharge  Premarin 2x/week Steroid cream - using it; 1-2x/wk.   Pt reports that she is recovering well from surgery. She is using no longer needing any pain meds for pain.  She reports that she never took any of the tramadol.  She is eating and drinking well. She is voiding without issue and having regular bowel movements.  She does report some ongoing vulvar itching.  She has returned to work without issue.   Past Medical/Surgical History: Past Medical History:  Diagnosis Date   Anxiety    Chronic low back pain    Complication of anesthesia    pt stated  "i do not like general anesthesia , it made me feel loopy for two weeks"  pt stated this happended with appendecotmy in 2010   DDD (degenerative disc disease), lumbosacral    Depression    GERD (gastroesophageal reflux disease)    History of kidney stones 1988   x1 episode passed on own   History of nonmelanoma skin cancer    SCC/ BCC   s/p  several excision of both   Hypertension    OA (osteoarthritis)    Osteopenia    Vulvar intraepithelial neoplasia (VIN) grade 3    Wears glasses    Wears hearing aid in both ears     Past Surgical History:  Procedure Laterality Date   BREAST EXCISIONAL BIOPSY Left 1961   benign   CATARACT EXTRACTION W/ INTRAOCULAR LENS IMPLANT Bilateral 2020   LAPAROSCOPIC ABDOMINAL EXPLORATION  12/15/2008   '@[facility]'$  by dr gerkin;   ABORTED TO EXPLORATORY LAPAROTOMY W/ APPENDECTOMY (RUPUTED)   TOTAL HIP ARTHROPLASTY  04/19/2012   Procedure: TOTAL HIP  ARTHROPLASTY ANTERIOR APPROACH;  Surgeon: Mcarthur Rossetti, MD;  Location: [facility];  Service: Orthopedics;  Laterality: Left;  Left Total Hip Arthroplasty   TOTAL HIP ARTHROPLASTY Right 04/21/2016   Procedure: RIGHT TOTAL HIP ARTHROPLASTY ANTERIOR APPROACH;  Surgeon: Mcarthur Rossetti, MD;  Location: [facility];  Service: Orthopedics;  Laterality: Right;   TOTAL KNEE  ARTHROPLASTY Right 12/20/2018   Procedure: RIGHT TOTAL KNEE ARTHROPLASTY;  Surgeon: Mcarthur Rossetti, MD;  Location: [facility];  Service: Orthopedics;  Laterality: Right;   VULVECTOMY N/A 07/25/2022   Procedure: Eugenie Norrie;  Surgeon: Bernadene Bell, MD;  Location: [facility];  Service: Gynecology;  Laterality: N/A;    Family History  Problem Relation Age of Onset   Cancer Father        prostate   Hypertension Father    Heart attack Father    Cancer Brother        pancreatic/liver   Cancer Maternal Grandmother        throat   Breast cancer Paternal Grandmother    Ovarian cancer Neg Hx    Uterine cancer Neg Hx     Social History   Socioeconomic History   Marital status: Divorced    Spouse name: Not on file   Number of children: Not on file   Years of education: Not on file   Highest education level: Not on file  Occupational History   Not on file  Tobacco Use   Smoking status: Former    Years: 29.00    Types: Cigarettes    Quit date: 04/15/1986    Years since quitting: 36.4   Smokeless tobacco: Never  Vaping Use   Vaping Use: Never used  Substance and Sexual Activity   Alcohol use: Yes    Comment: occasional   Drug use: Never   Sexual activity: Not on file  Other Topics Concern   Not on file  Social History Narrative   Not on file   Social Determinants of Health   Financial Resource Strain: Not on file  Food Insecurity: Not on file  Transportation Needs: Not on file  Physical Activity: Not on file  Stress: Not on file  Social Connections: Not on file    Current Medications:  Current Outpatient Medications:    acetaminophen (TYLENOL) 325 MG tablet, every 6 (six) hours as needed., Disp: , Rfl:    ALPRAZolam (XANAX) 0.25 MG tablet, Take 0.125 mg by mouth as needed for anxiety. , Disp: , Rfl:    atorvastatin (LIPITOR) 10 MG tablet, Take 10 mg by mouth daily at 6 PM., Disp: , Rfl: 11   Calcium Carb-Cholecalciferol (CALCIUM + D3 PO), Take  1 tablet by mouth daily., Disp: , Rfl:    calcium carbonate (TUMS - DOSED IN MG ELEMENTAL CALCIUM) 500 MG chewable tablet, Chew 1 tablet by mouth 2 (two) times daily as needed for indigestion or heartburn., Disp: , Rfl:    citalopram (CELEXA) 40 MG tablet, Take 20 mg by mouth at bedtime. , Disp: , Rfl:    clotrimazole-betamethasone (LOTRISONE) cream, Apply 1 Application topically 2 (two) times daily. Apply to groin for itching, Disp: 30 g, Rfl: 0   Difluprednate 0.05 % EMUL, Apply 1 drop to eye 3 (three) times daily as needed., Disp: , Rfl:    docusate sodium (COLACE) 100 MG capsule, Take 100 mg by mouth daily as needed for mild constipation., Disp: , Rfl:    hydrochlorothiazide (HYDRODIURIL) 12.5 MG tablet, Take 12.5 mg by mouth every  morning., Disp: , Rfl: 11   Multiple Vitamin (MULTIVITAMIN WITH MINERALS) TABS, Take 1 tablet by mouth daily., Disp: , Rfl:    Polyethyl Glycol-Propyl Glycol (SYSTANE OP), Place 1 drop into both eyes daily as needed (For dry eyes.). , Disp: , Rfl:    PREMARIN vaginal cream, Place vaginally 2 (two) times a week., Disp: , Rfl:   Review of Symptoms: Complete 10-system review is negative except as above in Interval History.  Physical Exam: LMP 10/31/1991  ***General: Alert, oriented, no acute distress. HEENT: Normocephalic, atraumatic. Neck symmetric without masses.  Chest: Normal work of breathing.  Extremities: Grossly normal range of motion.  Warm, well perfused.  No edema bilaterally. Skin: No rashes or lesions noted. GU: Normal appearing external genitalia with well hearing posterior partial vulvectomy.  Stitches still present but dissolving.  Minimal separation in the midline posteriorly at the junction with the vaginal mucosa.  No erythema or drainage.  Confirmed on single digit vaginal exam.  Exam chaperoned by Joylene John, NP ***granulation tissue silver nitrate  Laboratory & Radiologic Studies: Surgical pathology (07/25/22):  SURGICAL PATHOLOGY  CASE:  [id number]  PATIENT: Marcene Checo  Surgical Pathology Report    Clinical History: VIN 3 (crm)    FINAL MICROSCOPIC DIAGNOSIS:   A. VULVA, 6 OCLOCK, MARGIN, BIOPSY:  - Low-grade squamous intraepithelial lesion (VIN1, low grade dysplasia)  - Low-grade dysplasia is present at the tissue resection edge   B. VULVA, RIGHT POSTERIOR, BIOPSY:  - Low-grade squamous intraepithelial lesion (VIN1, low grade dysplasia)  - Low-grade dysplasia is present at the tissue resection edge   C. 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

## 2022-09-18 NOTE — Patient Instructions (Signed)
It was a pleasure to see you in clinic today. - Increase estrogen to nightly for 2 weeks and then reduce back to 2 times a week. - Return visit planned as scheduled.   Thank you very much for allowing me to provide care for you today.  I appreciate your confidence in choosing our Gynecologic Oncology team at [facility].  If you have any questions about your visit today please call our office or send [place] a MyChart message and we will get back to you as soon as possible.

## 2022-09-19 ENCOUNTER — Encounter: Payer: Self-pay | Admitting: Psychiatry

## 2022-09-20 ENCOUNTER — Telehealth: Payer: Self-pay | Admitting: Physician Assistant

## 2022-09-20 NOTE — Telephone Encounter (Signed)
Joelene Millin From [place] called in stating that they just received the appeal letter.... Just FYI.Marland KitchenMarland KitchenMarland Kitchen

## 2022-09-20 NOTE — Telephone Encounter (Signed)
Noted! Thank you

## 2022-09-20 NOTE — Telephone Encounter (Signed)
09/20/2022  To whom it may concern:  [name] 81 year old female with known osteoarthritis involving her left knee.  She has had prior Synvisc 1 injections that caused significant swelling and effusion of her left knee in the area for is unable to undergo Synvisc 1 injections.  She was scheduled for Orthovisc injections.  However orthopedics has the same precautions and contraindications for those that have not hypersensitive to eggs, feathers and avian proteins.  Therefore is not felt that Orthovisc to the unacceptable alternative to Synvisc 1.  Recommend Euflexxa which does not have the same contraindications/precautions.  Sincerely,    Benita Stabile, PA-C

## 2022-09-20 NOTE — Telephone Encounter (Signed)
Faxed Appeal letter to [facility] at [phone] for euflexxa, left knee

## 2022-09-25 ENCOUNTER — Ambulatory Visit: Payer: PRIVATE HEALTH INSURANCE | Admitting: Physician Assistant

## 2022-09-25 DIAGNOSIS — L821 Other seborrheic keratosis: Secondary | ICD-10-CM | POA: Diagnosis not present

## 2022-09-25 DIAGNOSIS — L570 Actinic keratosis: Secondary | ICD-10-CM | POA: Diagnosis not present

## 2022-09-27 ENCOUNTER — Other Ambulatory Visit: Payer: Self-pay

## 2022-09-27 DIAGNOSIS — M1712 Unilateral primary osteoarthritis, left knee: Secondary | ICD-10-CM

## 2022-10-05 ENCOUNTER — Ambulatory Visit (INDEPENDENT_AMBULATORY_CARE_PROVIDER_SITE_OTHER): Payer: Medicare Other | Admitting: Physician Assistant

## 2022-10-05 ENCOUNTER — Encounter: Payer: Self-pay | Admitting: Physician Assistant

## 2022-10-05 DIAGNOSIS — M1712 Unilateral primary osteoarthritis, left knee: Secondary | ICD-10-CM

## 2022-10-05 MED ORDER — METHYLPREDNISOLONE ACETATE 40 MG/ML IJ SUSP
40.0000 mg | INTRAMUSCULAR | Status: DC | PRN
  Administered 2022-10-05: 40 mg via INTRA_ARTICULAR

## 2022-10-05 MED ORDER — SODIUM HYALURONATE (VISCOSUP) 20 MG/2ML IX SOSY
20.0000 mg | PREFILLED_SYRINGE | INTRA_ARTICULAR | Status: AC | PRN
  Administered 2022-10-05: 20 mg via INTRA_ARTICULAR

## 2022-10-05 MED ORDER — LIDOCAINE HCL 1 % IJ SOLN
3.0000 mL | INTRAMUSCULAR | Status: DC | PRN
  Administered 2022-10-05: 3 mL

## 2022-10-05 NOTE — Progress Notes (Addendum)
   Procedure Note  Patient: [name]             Date of Birth: [birth year]           MRN: [id number]             Visit Date: 10/05/2022 HPI: [name] comes in today for schedule Euflexxa injection left knee.  She has known left knee osteoarthritis.  She has failed conservative treatment including Synvisc 1 injection which caused her to have allergic reaction.  She has had no new injuries to the knee.  She has no scheduled knee surgery in the next 6 months.  Denies any change in her knee pain.  Denies any fevers chills.  Review of systems: See HPI otherwise negative  Physical exam: Left knee good range of motion.  Positive slight effusion no abnormal warmth or erythema.   Procedures: Visit Diagnoses:  1. Unilateral primary osteoarthritis, left knee     Large Joint Inj: L knee on 10/05/2022 4:21 PM Indications: pain Details: 22 G 1.5 in needle, superolateral approach  Arthrogram: No  Medications: 20 mg Sodium Hyaluronate (Viscosup) 20 MG/2ML Aspirate: 7 mL yellow Outcome: tolerated well, no immediate complications Procedure, treatment alternatives, risks and benefits explained, specific risks discussed. Consent was given by the patient. Immediately prior to procedure a time out was called to verify the correct patient, procedure, equipment, support staff and site/side marked as required. Patient was prepped and draped in the usual sterile fashion.    Plan: Will see her back in a week for her second Euflexxa injection.  Questions were encouraged and answered at length.  She tolerated the injection well.  Ace bandage was applied to the knee she will remove this before retiring to bed this evening.

## 2022-10-11 DIAGNOSIS — I10 Essential (primary) hypertension: Secondary | ICD-10-CM | POA: Diagnosis not present

## 2022-10-12 ENCOUNTER — Encounter: Payer: Self-pay | Admitting: Physician Assistant

## 2022-10-12 ENCOUNTER — Ambulatory Visit (INDEPENDENT_AMBULATORY_CARE_PROVIDER_SITE_OTHER): Payer: Medicare Other | Admitting: Physician Assistant

## 2022-10-12 DIAGNOSIS — M1712 Unilateral primary osteoarthritis, left knee: Secondary | ICD-10-CM | POA: Diagnosis not present

## 2022-10-12 MED ORDER — SODIUM HYALURONATE (VISCOSUP) 20 MG/2ML IX SOSY
20.0000 mg | PREFILLED_SYRINGE | INTRA_ARTICULAR | Status: AC | PRN
  Administered 2022-10-12: 20 mg via INTRA_ARTICULAR

## 2022-10-12 NOTE — Progress Notes (Signed)
   Procedure Note  Patient: [name]             Date of Birth: June 05, 1941           MRN: [id number]             Visit Date: 10/12/2022 HPI: [name] returns today for her second Euflexxa injection.  She states overall she had no adverse effects and left knee.  No real improvement though on the knee.  Physical exam: Left knee no abnormal warmth erythema.  Overall good range of motion of the knee.  Procedures: Visit Diagnoses: No diagnosis found.  Large Joint Inj on 10/12/2022 12:58 PM Indications: pain Details: 22 G 1.5 in needle, anterolateral approach  Arthrogram: No  Medications: 20 mg Sodium Hyaluronate (Viscosup) 20 MG/2ML Outcome: tolerated well, no immediate complications Procedure, treatment alternatives, risks and benefits explained, specific risks discussed. Consent was given by the patient. Immediately prior to procedure a time out was called to verify the correct patient, procedure, equipment, support staff and site/side marked as required. Patient was prepped and draped in the usual sterile fashion.      Plan: Will see her back in 1 week for her third Euflexxa injection.  Questions were encouraged and answered

## 2022-10-19 ENCOUNTER — Ambulatory Visit (INDEPENDENT_AMBULATORY_CARE_PROVIDER_SITE_OTHER): Payer: Medicare Other | Admitting: Physician Assistant

## 2022-10-19 ENCOUNTER — Encounter: Payer: Self-pay | Admitting: Physician Assistant

## 2022-10-19 DIAGNOSIS — M1712 Unilateral primary osteoarthritis, left knee: Secondary | ICD-10-CM | POA: Diagnosis not present

## 2022-10-19 MED ORDER — SODIUM HYALURONATE (VISCOSUP) 20 MG/2ML IX SOSY
20.0000 mg | PREFILLED_SYRINGE | INTRA_ARTICULAR | Status: AC | PRN
  Administered 2022-10-19: 20 mg via INTRA_ARTICULAR

## 2022-10-19 NOTE — Progress Notes (Signed)
   Procedure Note  Patient: [name]             Date of Birth: 1941-04-23           MRN: [id number]             Visit Date: 10/19/2022  HPI: [name] comes in today for third Euflexxa injection left knee.  She has had no adverse effects with the Euflexxa injection.  She really is felt no improvement as of yet.  Physical exam: Left knee good range of motion no abnormal warmth erythema or effusion. Procedures: Visit Diagnoses:  1. Unilateral primary osteoarthritis, left knee     Large Joint Inj: L knee on 10/19/2022 5:26 PM Indications: pain Details: 22 G 1.5 in needle, anterolateral approach  Arthrogram: No  Medications: 20 mg Sodium Hyaluronate (Viscosup) 20 MG/2ML Outcome: tolerated well, no immediate complications Procedure, treatment alternatives, risks and benefits explained, specific risks discussed. Consent was given by the patient. Immediately prior to procedure a time out was called to verify the correct patient, procedure, equipment, support staff and site/side marked as required. Patient was prepped and draped in the usual sterile fashion.     Plan: She will follow-up with [place] as needed.  Next will be 6 months between Euflexxa injections.  Questions were encouraged and answered

## 2022-10-31 ENCOUNTER — Telehealth: Payer: Self-pay | Admitting: Physician Assistant

## 2022-10-31 NOTE — Telephone Encounter (Signed)
Patient called in stating she is having pain and swelling in left knee after injection would like to speak with a nurse, please advise

## 2022-10-31 NOTE — Telephone Encounter (Signed)
LMOM for patient to use ice and elevate, anti-inflammatories and the pain should decrease in a few days after gel injection

## 2022-11-21 DIAGNOSIS — R399 Unspecified symptoms and signs involving the genitourinary system: Secondary | ICD-10-CM | POA: Diagnosis not present

## 2022-11-21 DIAGNOSIS — N761 Subacute and chronic vaginitis: Secondary | ICD-10-CM | POA: Diagnosis not present

## 2022-12-12 DIAGNOSIS — L570 Actinic keratosis: Secondary | ICD-10-CM | POA: Diagnosis not present

## 2022-12-12 DIAGNOSIS — D0439 Carcinoma in situ of skin of other parts of face: Secondary | ICD-10-CM | POA: Diagnosis not present

## 2022-12-12 DIAGNOSIS — D485 Neoplasm of uncertain behavior of skin: Secondary | ICD-10-CM | POA: Diagnosis not present

## 2023-01-01 DIAGNOSIS — C44329 Squamous cell carcinoma of skin of other parts of face: Secondary | ICD-10-CM | POA: Diagnosis not present

## 2023-01-04 ENCOUNTER — Encounter: Payer: Self-pay | Admitting: Radiology

## 2023-01-08 ENCOUNTER — Ambulatory Visit: Payer: Medicare Other | Admitting: Podiatry

## 2023-01-08 DIAGNOSIS — L84 Corns and callosities: Secondary | ICD-10-CM

## 2023-01-08 DIAGNOSIS — I999 Unspecified disorder of circulatory system: Secondary | ICD-10-CM | POA: Diagnosis not present

## 2023-01-08 NOTE — Progress Notes (Signed)
Subjective:   Patient ID: [name], female   DOB: 81 y.o.   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

## 2023-01-29 DIAGNOSIS — Z5189 Encounter for other specified aftercare: Secondary | ICD-10-CM | POA: Diagnosis not present

## 2023-01-29 DIAGNOSIS — Z85828 Personal history of other malignant neoplasm of skin: Secondary | ICD-10-CM | POA: Diagnosis not present

## 2023-02-06 DIAGNOSIS — H26493 Other secondary cataract, bilateral: Secondary | ICD-10-CM | POA: Diagnosis not present

## 2023-02-06 DIAGNOSIS — Z961 Presence of intraocular lens: Secondary | ICD-10-CM | POA: Diagnosis not present

## 2023-02-06 DIAGNOSIS — H3500 Unspecified background retinopathy: Secondary | ICD-10-CM | POA: Diagnosis not present

## 2023-02-06 DIAGNOSIS — H353131 Nonexudative age-related macular degeneration, bilateral, early dry stage: Secondary | ICD-10-CM | POA: Diagnosis not present

## 2023-02-08 ENCOUNTER — Encounter: Payer: Self-pay | Admitting: Podiatry

## 2023-02-08 ENCOUNTER — Ambulatory Visit: Payer: Medicare Other | Admitting: Podiatry

## 2023-02-08 DIAGNOSIS — M7751 Other enthesopathy of right foot: Secondary | ICD-10-CM | POA: Diagnosis not present

## 2023-02-08 DIAGNOSIS — M2041 Other hammer toe(s) (acquired), right foot: Secondary | ICD-10-CM

## 2023-02-08 MED ORDER — TRIAMCINOLONE ACETONIDE 10 MG/ML IJ SUSP
10.0000 mg | Freq: Once | INTRAMUSCULAR | Status: AC
  Administered 2023-02-08: 10 mg

## 2023-02-08 NOTE — Progress Notes (Signed)
Subjective:   Patient ID: [name], female   DOB: 82 y.o.   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

## 2023-02-19 ENCOUNTER — Inpatient Hospital Stay: Payer: PRIVATE HEALTH INSURANCE | Admitting: Psychiatry

## 2023-02-19 DIAGNOSIS — D071 Carcinoma in situ of vulva: Secondary | ICD-10-CM

## 2023-02-20 DIAGNOSIS — E7800 Pure hypercholesterolemia, unspecified: Secondary | ICD-10-CM | POA: Diagnosis not present

## 2023-02-20 DIAGNOSIS — R7303 Prediabetes: Secondary | ICD-10-CM | POA: Diagnosis not present

## 2023-02-20 DIAGNOSIS — J392 Other diseases of pharynx: Secondary | ICD-10-CM | POA: Diagnosis not present

## 2023-02-20 DIAGNOSIS — I10 Essential (primary) hypertension: Secondary | ICD-10-CM | POA: Diagnosis not present

## 2023-02-21 DIAGNOSIS — L814 Other melanin hyperpigmentation: Secondary | ICD-10-CM | POA: Diagnosis not present

## 2023-02-21 DIAGNOSIS — L578 Other skin changes due to chronic exposure to nonionizing radiation: Secondary | ICD-10-CM | POA: Diagnosis not present

## 2023-02-21 DIAGNOSIS — L570 Actinic keratosis: Secondary | ICD-10-CM | POA: Diagnosis not present

## 2023-02-21 DIAGNOSIS — Z85828 Personal history of other malignant neoplasm of skin: Secondary | ICD-10-CM | POA: Diagnosis not present

## 2023-02-21 DIAGNOSIS — L821 Other seborrheic keratosis: Secondary | ICD-10-CM | POA: Diagnosis not present

## 2023-02-21 NOTE — Progress Notes (Signed)
This encounter was created in error - please disregard.  Patient rescheduled

## 2023-02-26 ENCOUNTER — Inpatient Hospital Stay: Payer: Medicare Other | Attending: Psychiatry | Admitting: Psychiatry

## 2023-02-26 ENCOUNTER — Other Ambulatory Visit: Payer: Self-pay

## 2023-02-26 VITALS — BP 127/57 | HR 86 | Temp 98.3°F | Wt 161.5 lb

## 2023-02-26 DIAGNOSIS — Z79890 Hormone replacement therapy (postmenopausal): Secondary | ICD-10-CM | POA: Insufficient documentation

## 2023-02-26 DIAGNOSIS — Z86002 Personal history of in-situ neoplasm of other and unspecified genital organs: Secondary | ICD-10-CM | POA: Diagnosis not present

## 2023-02-26 DIAGNOSIS — Z9079 Acquired absence of other genital organ(s): Secondary | ICD-10-CM | POA: Insufficient documentation

## 2023-02-26 DIAGNOSIS — D071 Carcinoma in situ of vulva: Secondary | ICD-10-CM

## 2023-02-26 NOTE — Patient Instructions (Signed)
It was a pleasure to see you in clinic today. - Things look good today. - Return visit planned for 6 months. If worsening itching or new lesion, let us know sooner.  Thank you very much for allowing me to provide care for you today.  I appreciate your confidence in choosing our Gynecologic Oncology team at [facility].  If you have any questions about your visit today please call our office or send [place] a MyChart message and we will get back to you as soon as possible.

## 2023-02-26 NOTE — Progress Notes (Unsigned)
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  VIN3 - NED on exam today *** - Signs/symptoms of recurrence reviewed. - Continue ***estrogen cream. - Recommend continued q49mo surveillance exams through 5 years.   RTC 6 months.  Clide Cliff, MD Gynecologic Oncology   Medical Decision Making I personally spent  TOTAL *** minutes face-to-face and non-face-to-face in the care of this patient, which includes all pre, intra, and post visit time on the date of service.  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  She was using Premarin twice weekly internally and externally, and was also prescribed clobetasol ointment for the lesion. Given persistent symptoms, lesion,  on 07/05/2022 she underwent an exam which noted a white plaque at the fourchette tender to palpation, and a biopsy was obtained, which resulted with VIN 3. Pt underwent a simple partial vulvectomy on 07/25/22 with final pathology showing VIN3, margins positive for VIN1 but negative for VIN3.  Interval History: ***occasion Estrogen 2x per week Steroid as needed - no often    Past Medical/Surgical History: Past Medical History:  Diagnosis Date   Anxiety    Chronic low back pain    Complication of anesthesia    pt stated  "i do not like general anesthesia , it made me feel loopy for two weeks"  pt stated this happended with appendecotmy in 2010   DDD (degenerative disc disease), lumbosacral    Depression    GERD (gastroesophageal reflux disease)    History of kidney stones 1988   x1 episode passed on own   History of nonmelanoma skin  cancer    SCC/ BCC   s/p  several excision of both   Hypertension    OA (osteoarthritis)    Osteopenia    Vulvar intraepithelial neoplasia (VIN) grade 3    Wears glasses    Wears hearing aid in both ears     Past Surgical History:  Procedure Laterality Date   BREAST EXCISIONAL BIOPSY Left 1961   benign   CATARACT EXTRACTION W/ INTRAOCULAR LENS IMPLANT Bilateral 2020   LAPAROSCOPIC ABDOMINAL EXPLORATION  12/15/2008   @[facility]  by dr gerkin;   ABORTED TO EXPLORATORY LAPAROTOMY W/ APPENDECTOMY (RUPUTED)   TOTAL HIP ARTHROPLASTY  04/19/2012   Procedure: TOTAL HIP ARTHROPLASTY ANTERIOR APPROACH;  Surgeon: Kathryne Hitch, MD;  Location: [facility];  Service: Orthopedics;  Laterality: Left;  Left Total Hip Arthroplasty   TOTAL HIP ARTHROPLASTY Right 04/21/2016   Procedure: RIGHT TOTAL HIP ARTHROPLASTY ANTERIOR APPROACH;  Surgeon: Kathryne Hitch, MD;  Location: [facility];  Service: Orthopedics;  Laterality: Right;   TOTAL KNEE ARTHROPLASTY Right 12/20/2018   Procedure: RIGHT TOTAL KNEE ARTHROPLASTY;  Surgeon: Kathryne Hitch, MD;  Location: [facility];  Service: Orthopedics;  Laterality: Right;   VULVECTOMY N/A 07/25/2022   Procedure: Jeannene Patella;  Surgeon: Clide Cliff, MD;  Location: [facility];  Service: Gynecology;  Laterality: N/A;    Family History  Problem Relation Age of Onset   Cancer Father        prostate   Hypertension Father    Heart attack Father    Cancer Brother        Designer, industrial/product  Cancer Maternal Grandmother        throat   Breast cancer Paternal Grandmother    Ovarian cancer Neg Hx    Uterine cancer Neg Hx     Social History   Socioeconomic History   Marital status: Divorced    Spouse name: Not on file   Number of children: Not on file   Years of education: Not on file   Highest education level: Not on file  Occupational History   Not on file  Tobacco Use   Smoking status: Former    Years: 29    Types: Cigarettes    Quit  date: 04/15/1986    Years since quitting: 36.8   Smokeless tobacco: Never  Vaping Use   Vaping Use: Never used  Substance and Sexual Activity   Alcohol use: Yes    Comment: occasional   Drug use: Never   Sexual activity: Not on file  Other Topics Concern   Not on file  Social History Narrative   Not on file   Social Determinants of Health   Financial Resource Strain: Not on file  Food Insecurity: Not on file  Transportation Needs: Not on file  Physical Activity: Not on file  Stress: Not on file  Social Connections: Not on file    Current Medications:  Current Outpatient Medications:    acetaminophen (TYLENOL) 325 MG tablet, every 6 (six) hours as needed., Disp: , Rfl:    ALPRAZolam (XANAX) 0.25 MG tablet, Take 0.125 mg by mouth as needed for anxiety. , Disp: , Rfl:    amLODipine (NORVASC) 2.5 MG tablet, Take 2.5 mg by mouth at bedtime., Disp: , Rfl:    atorvastatin (LIPITOR) 10 MG tablet, Take 10 mg by mouth daily at 6 PM., Disp: , Rfl: 11   Calcium Carb-Cholecalciferol (CALCIUM + D3 PO), Take 1 tablet by mouth daily., Disp: , Rfl:    calcium carbonate (TUMS - DOSED IN MG ELEMENTAL CALCIUM) 500 MG chewable tablet, Chew 1 tablet by mouth 2 (two) times daily as needed for indigestion or heartburn., Disp: , Rfl:    citalopram (CELEXA) 40 MG tablet, Take 20 mg by mouth at bedtime. , Disp: , Rfl:    Difluprednate 0.05 % EMUL, Apply 1 drop to eye 3 (three) times daily as needed., Disp: , Rfl:    docusate sodium (COLACE) 100 MG capsule, Take 100 mg by mouth daily as needed for mild constipation., Disp: , Rfl:    hydrochlorothiazide (HYDRODIURIL) 12.5 MG tablet, Take 12.5 mg by mouth every morning., Disp: , Rfl: 11   Multiple Vitamin (MULTIVITAMIN WITH MINERALS) TABS, Take 1 tablet by mouth daily., Disp: , Rfl:    Polyethyl Glycol-Propyl Glycol (SYSTANE OP), Place 1 drop into both eyes daily as needed (For dry eyes.). , Disp: , Rfl:    PREMARIN vaginal cream, Place vaginally 2 (two)  times a week., Disp: , Rfl:    estrogens, conjugated, (PREMARIN) 0.3 MG tablet, Premarin, Disp: , Rfl:   Review of Symptoms: Complete 10-system review is positive for: joint pain, back pain, dizziness, allergy cough, occasional itching  Physical Exam: LMP 10/31/1991  General: Alert, oriented, no acute distress. HEENT: Normocephalic, atraumatic. Neck symmetric without masses.  Chest: Normal work of breathing.  Extremities: Grossly normal range of motion.  Warm, well perfused.  No edema bilaterally. Skin: No rashes or lesions noted. GU: ***External genitalia well healed from  posterior partial vulvectomy. Stitches have dissolved. Subcentimeter polpypoid granulation tissue of incision line at posterior left medial  labia. Otherwise no new or concerning areas. Silver nitrate applied to this granulation tissue. No abnormal discharge. Exam chaperoned by ***   Laboratory & Radiologic Studies: ***

## 2023-03-01 ENCOUNTER — Encounter: Payer: Self-pay | Admitting: Psychiatry

## 2023-03-08 DIAGNOSIS — K080 Exfoliation of teeth due to systemic causes: Secondary | ICD-10-CM | POA: Diagnosis not present

## 2023-03-12 ENCOUNTER — Encounter: Payer: Self-pay | Admitting: Orthopaedic Surgery

## 2023-04-04 ENCOUNTER — Telehealth: Payer: Self-pay

## 2023-04-04 NOTE — Telephone Encounter (Signed)
VOB submitted for Euflexxa, left knee. 

## 2023-04-10 ENCOUNTER — Telehealth: Payer: Self-pay

## 2023-04-10 NOTE — Telephone Encounter (Signed)
PA has been started online through euflexxa portal for Euflexxa, left knee. PA pending

## 2023-04-16 ENCOUNTER — Telehealth: Payer: Self-pay | Admitting: *Deleted

## 2023-04-16 NOTE — Telephone Encounter (Signed)
Ms. Pehlke called the office requesting an appointment with Dr. Alvester Morin. Patient is stating that she is having increased sensations in her vulva area and describes her symptoms as "irritation"  Pt denies any vaginal bleeding or discharge. Asked patient if her Vulva has increased redness or new lesions? Patient states, "I don't know, It's hard to see"   Patient denies pain, fever or chills and denies all urinary symptoms. Patient is still using premarin twice weekly, denies any new medications or ointments. Per Dr. Mayfield Blas last visit and progress note, Patient was seen on 02/26/23 and surveillance exams every six months to monitor.  Patient was advised that we would forward her message to Dr. Alvester Morin and the office would give her a call with recommendations for an appointment.

## 2023-04-16 NOTE — Telephone Encounter (Signed)
Spoke with [name] and Patient states she has an appt. With her dermatologist on Monday, July 8th.  Patient was given an appt. On Monday, July 15th. With Dr. Alvester Morin. Patient agreed to date and time and advised if after seeing her dermatologist and she felt the need to cancel her appt. With Dr. Alvester Morin to call the office at [phone]. Pt has no further concerns or questions at this time.

## 2023-04-19 DIAGNOSIS — F330 Major depressive disorder, recurrent, mild: Secondary | ICD-10-CM | POA: Diagnosis not present

## 2023-04-19 DIAGNOSIS — Z85828 Personal history of other malignant neoplasm of skin: Secondary | ICD-10-CM | POA: Diagnosis not present

## 2023-04-26 NOTE — Telephone Encounter (Signed)
Holding for [name]

## 2023-04-27 ENCOUNTER — Ambulatory Visit: Payer: Medicare Other | Admitting: Podiatry

## 2023-04-27 ENCOUNTER — Encounter: Payer: Self-pay | Admitting: Podiatry

## 2023-04-27 DIAGNOSIS — M7751 Other enthesopathy of right foot: Secondary | ICD-10-CM

## 2023-04-27 NOTE — Progress Notes (Signed)
Subjective:   Patient ID: [name], female   DOB: 82 y.o.   MRN: [id number]   HPI Patient presents painful second toe right upset about her wait   ROS      Objective:  Physical Exam  Neurovascular status intact inflammation in her phalangeal joint digit to right with keratotic lesion     Assessment:  Inflammatory capsulitis digit to right     Plan:  Courtesy injection of 2 mg Dexasone Kenalog 5 g Liken into the joint and debrided lesion and apologized profusely for her weight of 30 minutes to 40 minutes but I did explain our shortage of staff currently and they were doing the best I can.

## 2023-04-30 ENCOUNTER — Other Ambulatory Visit: Payer: Self-pay

## 2023-04-30 ENCOUNTER — Telehealth: Payer: Self-pay | Admitting: Physician Assistant

## 2023-04-30 DIAGNOSIS — M1712 Unilateral primary osteoarthritis, left knee: Secondary | ICD-10-CM

## 2023-04-30 NOTE — Telephone Encounter (Signed)
Pt called requesting a script for lift of left shoe from [facility]. Pt phone number is [phone]

## 2023-04-30 NOTE — Telephone Encounter (Signed)
Called and left a VM for patient to return my call to schedule

## 2023-04-30 NOTE — Telephone Encounter (Signed)
Pt also called for approval of lt gel injection. Please call pt at [phone].

## 2023-04-30 NOTE — Telephone Encounter (Signed)
Called and left a Vm for patient to CB to schedule for gel injection with Dr. Blackman or Gil.  See referrals tab  

## 2023-05-01 NOTE — Telephone Encounter (Signed)
LMOM for patient asking if she would like to get her [facility] Rx tomorrow at her appt

## 2023-05-02 ENCOUNTER — Other Ambulatory Visit: Payer: Self-pay | Admitting: Internal Medicine

## 2023-05-02 DIAGNOSIS — Z1231 Encounter for screening mammogram for malignant neoplasm of breast: Secondary | ICD-10-CM

## 2023-05-07 ENCOUNTER — Other Ambulatory Visit: Payer: Self-pay

## 2023-05-07 ENCOUNTER — Encounter: Payer: Self-pay | Admitting: Orthopaedic Surgery

## 2023-05-07 ENCOUNTER — Ambulatory Visit: Payer: Medicare Other | Admitting: Orthopaedic Surgery

## 2023-05-07 DIAGNOSIS — M1712 Unilateral primary osteoarthritis, left knee: Secondary | ICD-10-CM

## 2023-05-07 DIAGNOSIS — M25562 Pain in left knee: Secondary | ICD-10-CM

## 2023-05-07 DIAGNOSIS — Z5189 Encounter for other specified aftercare: Secondary | ICD-10-CM | POA: Diagnosis not present

## 2023-05-07 MED ORDER — SODIUM HYALURONATE (VISCOSUP) 20 MG/2ML IX SOSY
20.0000 mg | PREFILLED_SYRINGE | INTRA_ARTICULAR | Status: AC | PRN
  Administered 2023-05-07: 20 mg via INTRA_ARTICULAR

## 2023-05-07 NOTE — Progress Notes (Signed)
   Procedure Note  Patient: [name]             Date of Birth: Jan 10, 1941           MRN: [id number]             Visit Date: 05/07/2023  Procedures: Visit Diagnoses:  1. Unilateral primary osteoarthritis, left knee     Large Joint Inj: L knee on 05/07/2023 8:15 AM Indications: diagnostic evaluation and pain Details: 22 G 1.5 in needle, superolateral approach  Arthrogram: No  Medications: 20 mg Sodium Hyaluronate (Viscosup) 20 MG/2ML Outcome: tolerated well, no immediate complications Procedure, treatment alternatives, risks and benefits explained, specific risks discussed. Consent was given by the patient. Immediately prior to procedure a time out was called to verify the correct patient, procedure, equipment, support staff and site/side marked as required. Patient was prepped and draped in the usual sterile fashion.    The patient is here today for series of 3 hyaluronic acid injections with Euflexxa.  Today's injection #1 of a series of 3 injections in her left knee to treat the pain from osteoarthritis.  She did let me know she is having some problems with lower extremity weakness in terms of just her balance and coordination and proprioception.  She does have a slight leg length discrepancy and this does throw her off.  Her left knee shows no effusion today.  We have replaced her right knee and her left hip.  I did place injection #1 of 3 hyaluronic acid injections in her left knee today.  She tolerated this well.  We will see her back next week for injection #2.

## 2023-05-08 ENCOUNTER — Ambulatory Visit
Admission: RE | Admit: 2023-05-08 | Discharge: 2023-05-08 | Disposition: A | Discharge status: Home / Self Care | Payer: Medicare Other | Source: Ambulatory Visit | Attending: Internal Medicine | Admitting: Internal Medicine

## 2023-05-08 DIAGNOSIS — Z1231 Encounter for screening mammogram for malignant neoplasm of breast: Secondary | ICD-10-CM

## 2023-05-09 ENCOUNTER — Telehealth: Payer: Self-pay | Admitting: *Deleted

## 2023-05-09 NOTE — Telephone Encounter (Signed)
Spoke with [name] who called the office requesting to cancel her upcoming appointment with Dr. Alvester Morin on Monday, July 15 th at 1:45. Patient states reason for canceling -"I am having less irritation and I will just follow up with Dr. Alvester Morin in October"  Pt has a follow up on Monday, October 28 th at 1:30 pm. Advised patient to call the office with any concerns or questions.

## 2023-05-11 DIAGNOSIS — I10 Essential (primary) hypertension: Secondary | ICD-10-CM | POA: Diagnosis not present

## 2023-05-11 DIAGNOSIS — F3341 Major depressive disorder, recurrent, in partial remission: Secondary | ICD-10-CM | POA: Diagnosis not present

## 2023-05-11 DIAGNOSIS — H35033 Hypertensive retinopathy, bilateral: Secondary | ICD-10-CM | POA: Diagnosis not present

## 2023-05-11 DIAGNOSIS — H353131 Nonexudative age-related macular degeneration, bilateral, early dry stage: Secondary | ICD-10-CM | POA: Diagnosis not present

## 2023-05-11 NOTE — Therapy (Addendum)
OUTPATIENT PHYSICAL THERAPY EVALUATION   Patient Name: [name] MRN: [id number] DOB:Jan 19, 1941, 82 y.o., female Today's Date: 05/18/2023  END OF SESSION:  PT End of Session - 05/18/23 0801     Visit Number 1    Number of Visits 20    Date for PT Re-Evaluation 07/27/23    Authorization Type [employer] $10 copay    Progress Note Due on Visit 10    PT Start Time 0800    PT Stop Time 0840    PT Time Calculation (min) 40 min    Activity Tolerance Patient tolerated treatment well    Behavior During Therapy WFL for tasks assessed/performed             Past Medical History:  Diagnosis Date   Anxiety    Chronic low back pain    Complication of anesthesia    pt stated  "i do not like general anesthesia , it made me feel loopy for two weeks"  pt stated this happended with appendecotmy in 2010   DDD (degenerative disc disease), lumbosacral    Depression    GERD (gastroesophageal reflux disease)    History of kidney stones 1988   x1 episode passed on own   History of nonmelanoma skin cancer    SCC/ BCC   s/p  several excision of both   Hypertension    OA (osteoarthritis)    Osteopenia    Vulvar intraepithelial neoplasia (VIN) grade 3    Wears glasses    Wears hearing aid in both ears    Past Surgical History:  Procedure Laterality Date   BREAST EXCISIONAL BIOPSY Left 1961   benign   CATARACT EXTRACTION W/ INTRAOCULAR LENS IMPLANT Bilateral 2020   LAPAROSCOPIC ABDOMINAL EXPLORATION  12/15/2008   @[facility]  by dr gerkin;   ABORTED TO EXPLORATORY LAPAROTOMY W/ APPENDECTOMY (RUPUTED)   TOTAL HIP ARTHROPLASTY  04/19/2012   Procedure: TOTAL HIP ARTHROPLASTY ANTERIOR APPROACH;  Surgeon: Kathryne Hitch, MD;  Location: [facility];  Service: Orthopedics;  Laterality: Left;  Left Total Hip Arthroplasty   TOTAL HIP ARTHROPLASTY Right 04/21/2016   Procedure: RIGHT TOTAL HIP ARTHROPLASTY ANTERIOR APPROACH;  Surgeon: Kathryne Hitch, MD;  Location: [facility];  Service: Orthopedics;   Laterality: Right;   TOTAL KNEE ARTHROPLASTY Right 12/20/2018   Procedure: RIGHT TOTAL KNEE ARTHROPLASTY;  Surgeon: Kathryne Hitch, MD;  Location: [facility];  Service: Orthopedics;  Laterality: Right;   VULVECTOMY N/A 07/25/2022   Procedure: Jeannene Patella;  Surgeon: Clide Cliff, MD;  Location: [facility];  Service: Gynecology;  Laterality: N/A;   Patient Active Problem List   Diagnosis Date Noted   Vulvar intraepithelial neoplasia (VIN) grade 3    Status post total right knee replacement 12/20/2018   Chronic pain of left knee 09/06/2017   Unilateral primary osteoarthritis, left knee 09/06/2017   Chronic pain of right knee 09/06/2017   Leg length difference, acquired 03/06/2017   Low back pain 03/06/2017   Unilateral primary osteoarthritis, right knee 12/06/2016   Status post total replacement of right hip 04/21/2016   Osteoarthritis of left thumb 10/15/2015   Degenerative arthritis of hip 04/19/2012   Anxiety    Cancer (HCC)    Depression    Arthritis    RENAL CALCULUS, RIGHT 12/15/2008   ABDOMINAL PAIN, UNSPECIFIED SITE 12/15/2008   NEPHROLITHIASIS, HX OF 12/15/2008   ANXIETY DEPRESSION 10/29/2007   CONTACT DERMATITIS DUE TO SOLAR RADIATION 10/29/2007   OSTEOARTHROSIS, LOCAL, PRIMARY, UNSPC SITE 08/16/2007  PCP: Thana Ates MD  REFERRING PROVIDER: Kathryne Hitch, MD  REFERRING DIAG: [id number] (ICD-10-CM) - Unilateral primary osteoarthritis, left knee M25.562 (ICD-10-CM) - Left knee pain, unspecified chronicity  THERAPY DIAG:  Chronic pain of left knee  Muscle weakness (generalized)  Difficulty in walking, not elsewhere classified  Rationale for Evaluation and Treatment: Rehabilitation  ONSET DATE: approx 1-2 years.   SUBJECTIVE:   SUBJECTIVE STATEMENT: Pt indicated having complaints with both knees but Lt knee was more trouble and had difficulty going up/down stairs, walking on uneven surfaces.  She indicated having occasional  discomfort in knee and some pain with pressure.   2 gels shots so far and 1 more coming.   Insidious onset.   PERTINENT HISTORY: UEA:VWUJ Rt THA now with Rt leg longer and wears lift in Lt shoe 04/21/16, anx,backpain,dep,HTN,osteopenia.  Rt TKA 12/20/18  PAIN:  NPRS scale: at worst 8/10, at best 0/10 Pain location: Lt knee  Pain description: stiffness, sharp/achy Aggravating factors: stairs, squatting, uneven surface walking Relieving factors: gel injection  PRECAUTIONS: None  WEIGHT BEARING RESTRICTIONS: No  FALLS:  Has patient fallen in last 6 months? 2 times with bruised ribs and Rt arm.  Pt reported having no fear of falling.   LIVING ENVIRONMENT: Lives in: House/apartment Stairs: getting inside 3-5 with rail on Rt side  OCCUPATION: Retired but work part time standing/walking required.   PLOF: Independent , walking, yard work ,hiking, previously Chartered certified accountant.  [employer] membership  PATIENT GOALS: Reduce pain, get more steady   OBJECTIVE:   PATIENT SURVEYS:  05/18/2023 FOTO intake:    predicted:    COGNITION: 05/18/2023 Overall cognitive status: WFL    SENSATION: 05/18/2023 WFL  EDEMA:  05/18/2023 No specific observed edema Lt knee   MUSCLE LENGTH: 05/18/2023 No specific testing  PALPATION: 05/18/2023 No specific tenderness indicated.   LOWER EXTREMITY ROM:   ROM Right 05/18/2023 Left 05/18/2023  Hip flexion    Hip extension    Hip abduction    Hip adduction    Hip internal rotation    Hip external rotation    Knee flexion  128 AROM c discomfort in supine heel slide   Knee extension 0 AROM in seated LAQ 0 AROM in seated LAQ  Ankle dorsiflexion    Ankle plantarflexion    Ankle inversion    Ankle eversion     (Blank rows = not tested)  LOWER EXTREMITY MMT:  MMT Right 05/18/2023 Left 05/18/2023  Hip flexion 5/5 5/5  Hip extension    Hip abduction    Hip adduction    Hip internal rotation    Hip external rotation    Knee flexion 5/5 5/5  Knee  extension 5/5 4/5  Ankle dorsiflexion 5/5 5/5  Ankle plantarflexion    Ankle inversion    Ankle eversion     (Blank rows = not tested)  LOWER EXTREMITY SPECIAL TESTS:  05/18/2023 No specific testing today.   FUNCTIONAL TESTS:  05/18/2023 18 inch chair transfer:  s UE on 1st try with mild weight shift to Rt  Lt SLS:  8 seconds Rt SLS: 5 seconds Tandem stance limited in control < 5 seconds s assist bilaterally  GAIT: 05/18/2023 Independent ambulation.  Shoe lift adaptation on Lt shoe.  TODAY'S TREATMENT                                                                          DATE:05/18/2023 Therex:    HEP instruction/performance c cues for techniques, handout provided.  Trial set performed of each for comprehension and symptom assessment.  See below for exercise list  Recumbent bike 5.5 mins lvl 2 c cues for use at gym.   PATIENT EDUCATION:  05/18/2023 Education details: HEP, POC Person educated: Patient Education method: Programmer, multimedia, Demonstration, Verbal cues, and Handouts Education comprehension: verbalized understanding, returned demonstration, and verbal cues required  HOME EXERCISE PROGRAM: Access Code: [email or web address] URL: [email or web address] Date: 05/18/2023 Prepared by: Chyrel Masson  Exercises - Sit to Stand  - 3 x daily - 7 x weekly - 1 sets - 10 reps - Seated Long Arc Quad  - 3-5 x daily - 7 x weekly - 1-2 sets - 10 reps - 2 hold - Seated Quad Set (Mirrored)  - 3-5 x daily - 7 x weekly - 1 sets - 10 reps - 5 hold - Seated Straight Leg Heel Taps  - 1-2 x daily - 7 x weekly - 3 sets - 10 reps - Tandem Stance  - 1 x daily - 7 x weekly - 1 sets - 3-5 reps - 30 hold  ASSESSMENT:  CLINICAL IMPRESSION: Patient is a 82 y.o. who comes to clinic with complaints of Lt knee pain with mobility,  strength and movement coordination deficits that impair their ability to perform usual daily and recreational functional activities without increase difficulty/symptoms at this time.  Patient to benefit from skilled PT services to address impairments and limitations to improve to previous level of function without restriction secondary to condition.   OBJECTIVE IMPAIRMENTS: decreased activity tolerance, decreased balance, decreased coordination, decreased endurance, decreased mobility, difficulty walking, decreased ROM, decreased strength, increased fascial restrictions, impaired perceived functional ability, impaired flexibility, improper body mechanics, and pain.   ACTIVITY LIMITATIONS: carrying, lifting, bending, standing, squatting, stairs, transfers, and locomotion level  PARTICIPATION LIMITATIONS: cleaning, laundry, interpersonal relationship, shopping, community activity, occupation, and yard work  PERSONAL FACTORS:  ZOX:WRUE Rt THA now with Rt leg longer and wears lift in Lt shoe 04/21/16, anx,backpain,dep,HTN,osteopenia  are also affecting patient's functional outcome.   REHAB POTENTIAL: Good  CLINICAL DECISION MAKING: Stable/uncomplicated  EVALUATION COMPLEXITY: Low   GOALS: Goals reviewed with patient? Yes  SHORT TERM GOALS: (target date for Short term goals are 3 weeks 06/08/2023)   1.  Patient will demonstrate independent use of home exercise program to maintain progress from in clinic treatments.  Goal status: New  LONG TERM GOALS: (target dates for all long term goals are 10 weeks  07/27/2023 )   1. Patient will demonstrate/report pain at worst less than or equal to 2/10 to facilitate minimal limitation in daily activity secondary to pain symptoms.  Goal status: New   2. Patient will demonstrate independent use of home exercise program to facilitate ability to maintain/progress functional gains from skilled physical therapy services.  Goal status: New   3. Patient will  demonstrate FOTO outcome > or = (update from testing on 2nd visit) % to indicate reduced disability due to condition.  Goal status: New  4.  Patient will demonstrate Lt  LE MMT 5/5 throughout to faciltiate usual transfers, stairs, squatting at [facility] for daily life.   Goal status: New   5.  Patient will demonstrate/report ability to ascend/descend stairs s UE assist for entry to house and in community.  Goal status: New   6.  Patient will demonstrate/report ability to perform sit to stand to sit s deviation s UE assist form 18 inch chair to indicate reduced fall risk.  Goal status: New   7.  Patient will demonstrate SLS testing bilateral > 15 seconds for stability in ambulation on even and uneven surfaces.  Goal Status: New   PLAN:  PT FREQUENCY: 1-2x/week  PT DURATION: 10 weeks  PLANNED INTERVENTIONS: Therapeutic exercises, Therapeutic activity, Neuro Muscular re-education, Balance training, Gait training, Patient/Family education, Joint mobilization, Stair training, DME instructions, Dry Needling, Electrical stimulation, Traction, Cryotherapy, vasopneumatic deviceMoist heat, Taping, Ultrasound, Ionotophoresis 4mg /ml Dexamethasone, and aquatic therapy, Manual therapy.  All included unless contraindicated  PLAN FOR NEXT SESSION: Review HEP knowledge/results.   Progressive quad strengthening without pain increase in WB (leg press possible).  Progressive balance challenges.   DO FOTO on 2nd visit if not too long since eval    Chyrel Masson, PT, DPT, OCS, ATC 05/18/23  8:45 AM

## 2023-05-14 ENCOUNTER — Ambulatory Visit: Payer: Medicare Other | Admitting: Physician Assistant

## 2023-05-14 ENCOUNTER — Encounter: Payer: Self-pay | Admitting: Physician Assistant

## 2023-05-14 ENCOUNTER — Inpatient Hospital Stay: Payer: Medicare Other | Admitting: Psychiatry

## 2023-05-14 DIAGNOSIS — M1712 Unilateral primary osteoarthritis, left knee: Secondary | ICD-10-CM | POA: Diagnosis not present

## 2023-05-14 MED ORDER — SODIUM HYALURONATE (VISCOSUP) 20 MG/2ML IX SOSY
20.0000 mg | PREFILLED_SYRINGE | INTRA_ARTICULAR | Status: AC | PRN
Start: 2023-05-14 — End: 2023-05-14
  Administered 2023-05-14: 20 mg via INTRA_ARTICULAR

## 2023-05-14 MED ORDER — HYALURONAN 88 MG/4ML IX SOSY
88.0000 mg | PREFILLED_SYRINGE | INTRA_ARTICULAR | Status: AC | PRN
Start: 2023-05-14 — End: 2023-05-14
  Administered 2023-05-14: 88 mg via INTRA_ARTICULAR

## 2023-05-14 NOTE — Progress Notes (Signed)
   Procedure Note  Patient: [name]             Date of Birth: 1941-04-21           MRN: [id number]             Visit Date: 05/14/2023 HPI: [name] comes in today for her second Euflexxa injection left knee.  She denies any adverse reaction to the first injection.  Physical exam: Left knee good range of motion.  No abnormal warmth erythema or effusion.  Ambulates without any assistive device. Procedures: Visit Diagnoses:  1. Unilateral primary osteoarthritis, left knee     Large Joint Inj on 05/14/2023 10:34 AM Indications: pain Details: 22 G 1.5 in needle, anterolateral approach  Arthrogram: No  Medications: 88 mg Hyaluronan 88 MG/4ML; 20 mg Sodium Hyaluronate (Viscosup) 20 MG/2ML Outcome: tolerated well, no immediate complications Procedure, treatment alternatives, risks and benefits explained, specific risks discussed. Consent was given by the patient. Immediately prior to procedure a time out was called to verify the correct patient, procedure, equipment, support staff and site/side marked as required. Patient was prepped and draped in the usual sterile fashion.    Plan: Will see her back in 1 week for her third Euflexxa injection left knee.

## 2023-05-18 ENCOUNTER — Other Ambulatory Visit: Payer: Self-pay

## 2023-05-18 ENCOUNTER — Encounter: Payer: Self-pay | Admitting: Rehabilitative and Restorative Service Providers"

## 2023-05-18 ENCOUNTER — Ambulatory Visit (INDEPENDENT_AMBULATORY_CARE_PROVIDER_SITE_OTHER): Payer: Medicare Other | Admitting: Rehabilitative and Restorative Service Providers"

## 2023-05-18 DIAGNOSIS — G8929 Other chronic pain: Secondary | ICD-10-CM | POA: Diagnosis not present

## 2023-05-18 DIAGNOSIS — R262 Difficulty in walking, not elsewhere classified: Secondary | ICD-10-CM | POA: Diagnosis not present

## 2023-05-18 DIAGNOSIS — M6281 Muscle weakness (generalized): Secondary | ICD-10-CM

## 2023-05-18 DIAGNOSIS — M25562 Pain in left knee: Secondary | ICD-10-CM

## 2023-05-21 ENCOUNTER — Ambulatory Visit (INDEPENDENT_AMBULATORY_CARE_PROVIDER_SITE_OTHER): Payer: Medicare Other | Admitting: Physician Assistant

## 2023-05-21 ENCOUNTER — Encounter: Payer: Self-pay | Admitting: Physician Assistant

## 2023-05-21 VITALS — Ht 65.0 in | Wt 161.5 lb

## 2023-05-21 DIAGNOSIS — M1712 Unilateral primary osteoarthritis, left knee: Secondary | ICD-10-CM | POA: Diagnosis not present

## 2023-05-21 MED ORDER — HYALURONAN 88 MG/4ML IX SOSY
88.0000 mg | PREFILLED_SYRINGE | INTRA_ARTICULAR | Status: AC | PRN
Start: 2023-05-21 — End: 2023-05-21
  Administered 2023-05-21: 88 mg via INTRA_ARTICULAR

## 2023-05-21 MED ORDER — SODIUM HYALURONATE (VISCOSUP) 20 MG/2ML IX SOSY
20.0000 mg | PREFILLED_SYRINGE | INTRA_ARTICULAR | Status: AC | PRN
Start: 2023-05-21 — End: 2023-05-21
  Administered 2023-05-21: 20 mg via INTRA_ARTICULAR

## 2023-05-21 NOTE — Progress Notes (Signed)
   Procedure Note  Patient: [name]             Date of Birth: 1941-05-19           MRN: [id number]             Visit Date: 05/21/2023 HPI: Mrs. Victorino December returns today for her third Euflexxa injection.  She has had no adverse effects to the injection.  Left knee: No abnormal warmth erythema.  Good range of motion. Procedures: Visit Diagnoses:  1. Unilateral primary osteoarthritis, left knee     Large Joint Inj: L knee on 05/21/2023 9:50 AM Indications: pain Details: 22 G 1.5 in needle, anterolateral approach  Arthrogram: No  Medications: 88 mg Hyaluronan 88 MG/4ML; 20 mg Sodium Hyaluronate (Viscosup) 20 MG/2ML Outcome: tolerated well, no immediate complications Procedure, treatment alternatives, risks and benefits explained, specific risks discussed. Consent was given by the patient. Immediately prior to procedure a time out was called to verify the correct patient, procedure, equipment, support staff and site/side marked as required. Patient was prepped and draped in the usual sterile fashion.     Plan: She will follow-up with [place] as needed most likely 6 months between supplemental injections.  Questions were encouraged and answered

## 2023-05-22 DIAGNOSIS — L570 Actinic keratosis: Secondary | ICD-10-CM | POA: Diagnosis not present

## 2023-06-07 ENCOUNTER — Encounter: Payer: Medicare Other | Admitting: Rehabilitative and Restorative Service Providers"

## 2023-06-12 ENCOUNTER — Ambulatory Visit: Payer: Medicare Other | Admitting: Rehabilitative and Restorative Service Providers"

## 2023-06-12 ENCOUNTER — Encounter: Payer: Self-pay | Admitting: Rehabilitative and Restorative Service Providers"

## 2023-06-12 DIAGNOSIS — M25562 Pain in left knee: Secondary | ICD-10-CM | POA: Diagnosis not present

## 2023-06-12 DIAGNOSIS — R262 Difficulty in walking, not elsewhere classified: Secondary | ICD-10-CM | POA: Diagnosis not present

## 2023-06-12 DIAGNOSIS — G8929 Other chronic pain: Secondary | ICD-10-CM

## 2023-06-12 DIAGNOSIS — M6281 Muscle weakness (generalized): Secondary | ICD-10-CM | POA: Diagnosis not present

## 2023-06-12 NOTE — Therapy (Signed)
OUTPATIENT PHYSICAL THERAPY TREATMENT   Patient Name: [name] MRN: [id number] DOB:09-11-1941, 82 y.o., female Today's Date: 06/12/2023  END OF SESSION:  PT End of Session - 06/12/23 1010     Visit Number 2    Number of Visits 20    Date for PT Re-Evaluation 07/27/23    Authorization Type [employer] $10 copay    Progress Note Due on Visit 10    PT Start Time 1005    PT Stop Time 1044    PT Time Calculation (min) 39 min    Activity Tolerance Patient tolerated treatment well    Behavior During Therapy WFL for tasks assessed/performed              Past Medical History:  Diagnosis Date   Anxiety    Chronic low back pain    Complication of anesthesia    pt stated  "i do not like general anesthesia , it made me feel loopy for two weeks"  pt stated this happended with appendecotmy in 2010   DDD (degenerative disc disease), lumbosacral    Depression    GERD (gastroesophageal reflux disease)    History of kidney stones 1988   x1 episode passed on own   History of nonmelanoma skin cancer    SCC/ BCC   s/p  several excision of both   Hypertension    OA (osteoarthritis)    Osteopenia    Vulvar intraepithelial neoplasia (VIN) grade 3    Wears glasses    Wears hearing aid in both ears    Past Surgical History:  Procedure Laterality Date   BREAST EXCISIONAL BIOPSY Left 1961   benign   CATARACT EXTRACTION W/ INTRAOCULAR LENS IMPLANT Bilateral 2020   LAPAROSCOPIC ABDOMINAL EXPLORATION  12/15/2008   @[facility]  by dr gerkin;   ABORTED TO EXPLORATORY LAPAROTOMY W/ APPENDECTOMY (RUPUTED)   TOTAL HIP ARTHROPLASTY  04/19/2012   Procedure: TOTAL HIP ARTHROPLASTY ANTERIOR APPROACH;  Surgeon: Kathryne Hitch, MD;  Location: [facility];  Service: Orthopedics;  Laterality: Left;  Left Total Hip Arthroplasty   TOTAL HIP ARTHROPLASTY Right 04/21/2016   Procedure: RIGHT TOTAL HIP ARTHROPLASTY ANTERIOR APPROACH;  Surgeon: Kathryne Hitch, MD;  Location: [facility];  Service: Orthopedics;   Laterality: Right;   TOTAL KNEE ARTHROPLASTY Right 12/20/2018   Procedure: RIGHT TOTAL KNEE ARTHROPLASTY;  Surgeon: Kathryne Hitch, MD;  Location: [facility];  Service: Orthopedics;  Laterality: Right;   VULVECTOMY N/A 07/25/2022   Procedure: Jeannene Patella;  Surgeon: Clide Cliff, MD;  Location: [facility];  Service: Gynecology;  Laterality: N/A;   Patient Active Problem List   Diagnosis Date Noted   Vulvar intraepithelial neoplasia (VIN) grade 3    Status post total right knee replacement 12/20/2018   Chronic pain of left knee 09/06/2017   Unilateral primary osteoarthritis, left knee 09/06/2017   Chronic pain of right knee 09/06/2017   Leg length difference, acquired 03/06/2017   Low back pain 03/06/2017   Unilateral primary osteoarthritis, right knee 12/06/2016   Status post total replacement of right hip 04/21/2016   Osteoarthritis of left thumb 10/15/2015   Degenerative arthritis of hip 04/19/2012   Anxiety    Cancer (HCC)    Depression    Arthritis    RENAL CALCULUS, RIGHT 12/15/2008   ABDOMINAL PAIN, UNSPECIFIED SITE 12/15/2008   NEPHROLITHIASIS, HX OF 12/15/2008   ANXIETY DEPRESSION 10/29/2007   CONTACT DERMATITIS DUE TO SOLAR RADIATION 10/29/2007   OSTEOARTHROSIS, LOCAL, PRIMARY, UNSPC SITE 08/16/2007  PCP: Thana Ates MD  REFERRING PROVIDER: Kathryne Hitch, MD  REFERRING DIAG: [phone] (ICD-10-CM) - Unilateral primary osteoarthritis, left knee M25.562 (ICD-10-CM) - Left knee pain, unspecified chronicity  THERAPY DIAG:  Chronic pain of left knee  Muscle weakness (generalized)  Difficulty in walking, not elsewhere classified  Rationale for Evaluation and Treatment: Rehabilitation  ONSET DATE: approx 1-2 years.   SUBJECTIVE:   SUBJECTIVE STATEMENT: Pt indicated having 3rd injection in Lt knee.  Pt indicated doing some hiking in time after the injection.  Pt indicated intermittent pain.    PERTINENT HISTORY: YQM:VHQI Rt THA  now with Rt leg longer and wears lift in Lt shoe 04/21/16, anx,backpain,dep,HTN,osteopenia.  Rt TKA 12/20/18  PAIN:  NPRS scale: at worst 3/10 Pain location: Lt knee  Pain description: stiffness, sharp/achy Aggravating factors: stairs, squatting, uneven surface walking Relieving factors: gel injection  PRECAUTIONS: None  WEIGHT BEARING RESTRICTIONS: No  FALLS:  Has patient fallen in last 6 months? 2 times with bruised ribs and Rt arm.  Pt reported having no fear of falling.   LIVING ENVIRONMENT: Lives in: House/apartment Stairs: getting inside 3-5 with rail on Rt side  OCCUPATION: Retired but work part time standing/walking required.   PLOF: Independent , walking, yard work ,hiking, previously [employer] certified accountant.  [employer] membership  PATIENT GOALS: Reduce pain, get more steady   OBJECTIVE:   PATIENT SURVEYS:  No FOTO (computer issues on first visit)  COGNITION: 05/18/2023 Overall cognitive status: WFL    SENSATION: 05/18/2023 WFL  EDEMA:  05/18/2023 No specific observed edema Lt knee   MUSCLE LENGTH: 05/18/2023 No specific testing  PALPATION: 05/18/2023 No specific tenderness indicated.   LOWER EXTREMITY ROM:   ROM Right 05/18/2023 Left 05/18/2023  Hip flexion    Hip extension    Hip abduction    Hip adduction    Hip internal rotation    Hip external rotation    Knee flexion  128 AROM c discomfort in supine heel slide   Knee extension 0 AROM in seated LAQ 0 AROM in seated LAQ  Ankle dorsiflexion    Ankle plantarflexion    Ankle inversion    Ankle eversion     (Blank rows = not tested)  LOWER EXTREMITY MMT:  MMT Right 05/18/2023 Left 05/18/2023 Left 06/12/2023  Hip flexion 5/5 5/5   Hip extension     Hip abduction     Hip adduction     Hip internal rotation     Hip external rotation     Knee flexion 5/5 5/5   Knee extension 5/5 4/5 4+/5  Ankle dorsiflexion 5/5 5/5   Ankle plantarflexion     Ankle inversion     Ankle eversion      (Blank rows =  not tested)  LOWER EXTREMITY SPECIAL TESTS:  05/18/2023 No specific testing today.   FUNCTIONAL TESTS:  05/18/2023 18 inch chair transfer:  s UE on 1st try with mild weight shift to Rt  Lt SLS:  8 seconds Rt SLS: 5 seconds Tandem stance limited in control < 5 seconds s assist bilaterally  GAIT: 05/18/2023 Independent ambulation.  Shoe lift adaptation on Lt shoe.  TODAY'S TREATMENT                                                                          DATE: 06/12/2023 Therex:  Nustep lvl 5 10 mins UE/LE Leg press double leg 75 lbs x 15, single leg x 15 bilateral Leg extension machine double leg up, single leg lowering 5 lbs x5.  Instruction for isometric loading in positioning with higher weight set on machine to avoid crepitus in patellar movement Seated Lt knee flexion machine 15 lbs x 10  Cues given on adjustments on gym equipment, pain monitoring and adjustment of weight/techniques based  off those symptoms.    Neuro Re-ed Review of tandem stance activity Heel toe lifts alternating x 20    TODAY'S TREATMENT                                                                          DATE:05/18/2023 Therex:    HEP instruction/performance c cues for techniques, handout provided.  Trial set performed of each for comprehension and symptom assessment.  See below for exercise list  Recumbent bike 5.5 mins lvl 2 c cues for use at gym.   PATIENT EDUCATION:  06/12/2023 Education details: HEP update, gym educaiton Person educated: Patient Education method: Explanation, Demonstration, Verbal cues, and Handouts Education comprehension: verbalized understanding, returned demonstration, and verbal cues required  HOME EXERCISE PROGRAM: Access Code: [email or web address] URL: [email or web address] Date:  06/12/2023 Prepared by: Chyrel Masson  Exercises - Sit to Stand  - 3 x daily - 7 x weekly - 1 sets - 10 reps - Seated Long Arc Quad  - 3-5 x daily - 7 x weekly - 1-2 sets - 10 reps - 2 hold - Seated Quad Set (Mirrored)  - 3-5 x daily - 7 x weekly - 1 sets - 10 reps - 5 hold - Seated Straight Leg Heel Taps  - 1-2 x daily - 7 x weekly - 3 sets - 10 reps - Tandem Stance  - 1 x daily - 7 x weekly - 1 sets - 3-5 reps - 30 hold - Heel Toe Raises with Counter Support  - 1 x daily - 7 x weekly - 2-3 sets - 10 reps  ASSESSMENT:  CLINICAL IMPRESSION: Spent time today in review of possible gym based activity to progressive strengthening program on own when appropriate.  Pt may continue to benefit from in clinic activity to promote improved strength and balance control.   OBJECTIVE IMPAIRMENTS: decreased activity tolerance, decreased balance, decreased coordination, decreased endurance, decreased mobility, difficulty walking, decreased ROM, decreased strength, increased fascial restrictions, impaired perceived functional ability, impaired flexibility, improper body mechanics, and pain.   ACTIVITY LIMITATIONS: carrying, lifting, bending, standing, squatting, stairs, transfers, and locomotion level  PARTICIPATION LIMITATIONS: cleaning, laundry, interpersonal relationship, shopping, community activity, occupation, and yard work  PERSONAL FACTORS:  ZOX:WRUE Rt THA now with Rt leg longer and wears lift in Lt shoe 04/21/16, anx,backpain,dep,HTN,osteopenia  are also  affecting patient's functional outcome.   REHAB POTENTIAL: Good  CLINICAL DECISION MAKING: Stable/uncomplicated  EVALUATION COMPLEXITY: Low   GOALS: Goals reviewed with patient? Yes  SHORT TERM GOALS: (target date for Short term goals are 3 weeks 06/08/2023)   1.  Patient will demonstrate independent use of home exercise program to maintain progress from in clinic treatments.  Goal status: Met   LONG TERM GOALS: (target dates for all  long term goals are 10 weeks  07/27/2023 )   1. Patient will demonstrate/report pain at worst less than or equal to 2/10 to facilitate minimal limitation in daily activity secondary to pain symptoms.  Goal status: New   2. Patient will demonstrate independent use of home exercise program to facilitate ability to maintain/progress functional gains from skilled physical therapy services.  Goal status: on going 06/12/2023   3. Patient will demonstrate FOTO outcome > or = (update from testing on 2nd visit) % to indicate reduced disability due to condition.  Goal status: on going 06/12/2023   4.  Patient will demonstrate Lt  LE MMT 5/5 throughout to faciltiate usual transfers, stairs, squatting at [facility] for daily life.   Goal status: on going 06/12/2023   5.  Patient will demonstrate/report ability to ascend/descend stairs s UE assist for entry to house and in community.  Goal status: on going 06/12/2023   6.  Patient will demonstrate/report ability to perform sit to stand to sit s deviation s UE assist form 18 inch chair to indicate reduced fall risk.  Goal status: on going 06/12/2023   7.  Patient will demonstrate SLS testing bilateral > 15 seconds for stability in ambulation on even and uneven surfaces.  Goal Status: on going 06/12/2023   PLAN:  PT FREQUENCY: 1-2x/week  PT DURATION: 10 weeks  PLANNED INTERVENTIONS: Therapeutic exercises, Therapeutic activity, Neuro Muscular re-education, Balance training, Gait training, Patient/Family education, Joint mobilization, Stair training, DME instructions, Dry Needling, Electrical stimulation, Traction, Cryotherapy, vasopneumatic deviceMoist heat, Taping, Ultrasound, Ionotophoresis 4mg /ml Dexamethasone, and aquatic therapy, Manual therapy.  All included unless contraindicated  PLAN FOR NEXT SESSION: Check any gym trial results.    Chyrel Masson, PT, DPT, OCS, ATC 06/12/23  10:48 AM

## 2023-06-15 ENCOUNTER — Encounter: Payer: Self-pay | Admitting: Rehabilitative and Restorative Service Providers"

## 2023-06-15 ENCOUNTER — Ambulatory Visit: Payer: Medicare Other | Admitting: Rehabilitative and Restorative Service Providers"

## 2023-06-15 DIAGNOSIS — R262 Difficulty in walking, not elsewhere classified: Secondary | ICD-10-CM

## 2023-06-15 DIAGNOSIS — M25562 Pain in left knee: Secondary | ICD-10-CM | POA: Diagnosis not present

## 2023-06-15 DIAGNOSIS — M6281 Muscle weakness (generalized): Secondary | ICD-10-CM

## 2023-06-15 DIAGNOSIS — G8929 Other chronic pain: Secondary | ICD-10-CM | POA: Diagnosis not present

## 2023-06-15 NOTE — Therapy (Signed)
OUTPATIENT PHYSICAL THERAPY TREATMENT   Patient Name: [name] MRN: [id number] DOB:June 15, 1941, 82 y.o., female Today's Date: 06/15/2023  END OF SESSION:  PT End of Session - 06/15/23 0918     Visit Number 3    Number of Visits 20    Date for PT Re-Evaluation 07/27/23    Authorization Type [employer] $10 copay    Progress Note Due on Visit 10    PT Start Time 0918    PT Stop Time 0958    PT Time Calculation (min) 40 min    Activity Tolerance Patient tolerated treatment well    Behavior During Therapy [facility] for tasks assessed/performed               Past Medical History:  Diagnosis Date   Anxiety    Chronic low back pain    Complication of anesthesia    pt stated  "i do not like general anesthesia , it made me feel loopy for two weeks"  pt stated this happended with appendecotmy in 2010   DDD (degenerative disc disease), lumbosacral    Depression    GERD (gastroesophageal reflux disease)    History of kidney stones 1988   x1 episode passed on own   History of nonmelanoma skin cancer    SCC/ BCC   s/p  several excision of both   Hypertension    OA (osteoarthritis)    Osteopenia    Vulvar intraepithelial neoplasia (VIN) grade 3    Wears glasses    Wears hearing aid in both ears    Past Surgical History:  Procedure Laterality Date   BREAST EXCISIONAL BIOPSY Left 1961   benign   CATARACT EXTRACTION W/ INTRAOCULAR LENS IMPLANT Bilateral 2020   LAPAROSCOPIC ABDOMINAL EXPLORATION  12/15/2008   @[facility]  by dr gerkin;   ABORTED TO EXPLORATORY LAPAROTOMY W/ APPENDECTOMY (RUPUTED)   TOTAL HIP ARTHROPLASTY  04/19/2012   Procedure: TOTAL HIP ARTHROPLASTY ANTERIOR APPROACH;  Surgeon: Kathryne Hitch, MD;  Location: [facility];  Service: Orthopedics;  Laterality: Left;  Left Total Hip Arthroplasty   TOTAL HIP ARTHROPLASTY Right 04/21/2016   Procedure: RIGHT TOTAL HIP ARTHROPLASTY ANTERIOR APPROACH;  Surgeon: Kathryne Hitch, MD;  Location: [facility];  Service: Orthopedics;   Laterality: Right;   TOTAL KNEE ARTHROPLASTY Right 12/20/2018   Procedure: RIGHT TOTAL KNEE ARTHROPLASTY;  Surgeon: Kathryne Hitch, MD;  Location: [facility];  Service: Orthopedics;  Laterality: Right;   VULVECTOMY N/A 07/25/2022   Procedure: Jeannene Patella;  Surgeon: Clide Cliff, MD;  [facility];  Service: Gynecology;  Laterality: N/A;   Patient Active Problem List   Diagnosis Date Noted   Vulvar intraepithelial neoplasia (VIN) grade 3    Status post total right knee replacement 12/20/2018   Chronic pain of left knee 09/06/2017   Unilateral primary osteoarthritis, left knee 09/06/2017   Chronic pain of right knee 09/06/2017   Leg length difference, acquired 03/06/2017   Low back pain 03/06/2017   Unilateral primary osteoarthritis, right knee 12/06/2016   Status post total replacement of right hip 04/21/2016   Osteoarthritis of left thumb 10/15/2015   Degenerative arthritis of hip 04/19/2012   Anxiety    Cancer (HCC)    Depression    Arthritis    RENAL CALCULUS, RIGHT 12/15/2008   ABDOMINAL PAIN, UNSPECIFIED SITE 12/15/2008   NEPHROLITHIASIS, HX OF 12/15/2008   ANXIETY DEPRESSION 10/29/2007   CONTACT DERMATITIS DUE TO SOLAR RADIATION 10/29/2007   OSTEOARTHROSIS, LOCAL, PRIMARY, UNSPC SITE 08/16/2007  PCP: Thana Ates MD  REFERRING PROVIDER: Kathryne Hitch, MD  REFERRING DIAG: [phone] (ICD-10-CM) - Unilateral primary osteoarthritis, left knee M25.562 (ICD-10-CM) - Left knee pain, unspecified chronicity  THERAPY DIAG:  Chronic pain of left knee  Muscle weakness (generalized)  Difficulty in walking, not elsewhere classified  Rationale for Evaluation and Treatment: Rehabilitation  ONSET DATE: approx 1-2 years.   SUBJECTIVE:   SUBJECTIVE STATEMENT: Pt indicated trying to do some movements like stairs more normally.  Reported a little ache today.   PERTINENT HISTORY: BJY:NWGN Rt THA now with Rt leg longer and wears lift in  Lt shoe 04/21/16, anx,backpain,dep,HTN,osteopenia.  Rt TKA 12/20/18  PAIN:  NPRS scale: at worst 3/10 Pain location: Lt knee  Pain description: stiffness, sharp/achy Aggravating factors: stairs, squatting, uneven surface walking Relieving factors: gel injection  PRECAUTIONS: None  WEIGHT BEARING RESTRICTIONS: No  FALLS:  Has patient fallen in last 6 months? 2 times with bruised ribs and Rt arm.  Pt reported having no fear of falling.   LIVING ENVIRONMENT: Lives in: House/apartment Stairs: getting inside 3-5 with rail on Rt side  OCCUPATION: Retired but work part time standing/walking required.   PLOF: Independent , walking, yard work ,hiking, previously Chartered certified accountant.  [employer] membership  PATIENT GOALS: Reduce pain, get more steady   OBJECTIVE:   PATIENT SURVEYS:  No FOTO (computer issues on first visit)  COGNITION: 05/18/2023 Overall cognitive status: WFL    SENSATION: 05/18/2023 WFL  EDEMA:  05/18/2023 No specific observed edema Lt knee   MUSCLE LENGTH: 05/18/2023 No specific testing  PALPATION: 05/18/2023 No specific tenderness indicated.   LOWER EXTREMITY ROM:   ROM Right 05/18/2023 Left 05/18/2023  Hip flexion    Hip extension    Hip abduction    Hip adduction    Hip internal rotation    Hip external rotation    Knee flexion  128 AROM c discomfort in supine heel slide   Knee extension 0 AROM in seated LAQ 0 AROM in seated LAQ  Ankle dorsiflexion    Ankle plantarflexion    Ankle inversion    Ankle eversion     (Blank rows = not tested)  LOWER EXTREMITY MMT:  MMT Right 05/18/2023 Left 05/18/2023 Left 06/12/2023  Hip flexion 5/5 5/5   Hip extension     Hip abduction     Hip adduction     Hip internal rotation     Hip external rotation     Knee flexion 5/5 5/5   Knee extension 5/5 4/5 4+/5  Ankle dorsiflexion 5/5 5/5   Ankle plantarflexion     Ankle inversion     Ankle eversion      (Blank rows = not tested)  LOWER EXTREMITY SPECIAL  TESTS:  05/18/2023 No specific testing today.   FUNCTIONAL TESTS:  05/18/2023 18 inch chair transfer:  s UE on 1st try with mild weight shift to Rt  Lt SLS:  8 seconds Rt SLS: 5 seconds Tandem stance limited in control < 5 seconds s assist bilaterally  GAIT: 05/18/2023 Independent ambulation.  Shoe lift adaptation on Lt shoe.  TODAY'S TREATMENT                                                                          DATE: 06/15/2023 Therex: Nustep lvl 6 10 mins UE/LE Leg press double leg 75 lbs x 15, single leg 2 x 15 bilateral 31 lbs  Sit to stand to sit 18 inch chair s UE assist slowly  x 10   Additional time spent in review of gym cues and exercise adjustements   Neuro Re-ed Heel toe lifts alternating x 20 slow movement focus  Tandem stance 1 min x 1 bilateral c occasional HHA   TODAY'S TREATMENT                                                                          DATE: 06/12/2023 Therex:  Nustep lvl 5 10 mins UE/LE Leg press double leg 75 lbs x 15, single leg x 15 bilateral Leg extension machine double leg up, single leg lowering 5 lbs x5.  Instruction for isometric loading in positioning with higher weight set on machine to avoid crepitus in patellar movement Seated Lt knee flexion machine 15 lbs x 10  Cues given on adjustments on gym equipment, pain monitoring and adjustment of weight/techniques based  off those symptoms.    Neuro Re-ed Review of tandem stance activity Heel toe lifts alternating x 20    TODAY'S TREATMENT                                                                          DATE:05/18/2023 Therex:    HEP instruction/performance c cues for techniques, handout provided.  Trial set performed of each for comprehension and symptom assessment.  See below for exercise list  Recumbent bike  5.5 mins lvl 2 c cues for use at gym.   PATIENT EDUCATION:  06/12/2023 Education details: HEP update, gym educaiton Person educated: Patient Education method: Explanation, Demonstration, Verbal cues, and Handouts Education comprehension: verbalized understanding, returned demonstration, and verbal cues required  HOME EXERCISE PROGRAM: Access Code: [email or web address] URL: [email or web address] Date: 06/12/2023 Prepared by: Chyrel Masson  Exercises - Sit to Stand  - 3 x daily - 7 x weekly - 1 sets - 10 reps - Seated Long Arc Quad  - 3-5 x daily - 7 x weekly - 1-2 sets - 10 reps - 2 hold - Seated Quad Set (Mirrored)  - 3-5 x daily - 7 x weekly - 1 sets - 10 reps - 5 hold - Seated Straight Leg Heel Taps  - 1-2 x daily - 7 x weekly - 3 sets - 10 reps - Tandem Stance  - 1 x daily - 7 x weekly - 1  sets - 3-5 reps - 30 hold - Heel Toe Raises with Counter Support  - 1 x daily - 7 x weekly - 2-3 sets - 10 reps  ASSESSMENT:  CLINICAL IMPRESSION: Continued education and cues for gym based activity and benefits of progression strengthening.  Pt to continue to benefit from progressive strengthening and balance improvements to improve functional activity including walking/stairs and transfers.   OBJECTIVE IMPAIRMENTS: decreased activity tolerance, decreased balance, decreased coordination, decreased endurance, decreased mobility, difficulty walking, decreased ROM, decreased strength, increased fascial restrictions, impaired perceived functional ability, impaired flexibility, improper body mechanics, and pain.   ACTIVITY LIMITATIONS: carrying, lifting, bending, standing, squatting, stairs, transfers, and locomotion level  PARTICIPATION LIMITATIONS: cleaning, laundry, interpersonal relationship, shopping, community activity, occupation, and yard work  PERSONAL FACTORS:  CBJ:SEGB Rt THA now with Rt leg longer and wears lift in Lt shoe 04/21/16, anx,backpain,dep,HTN,osteopenia  are also affecting  patient's functional outcome.   REHAB POTENTIAL: Good  CLINICAL DECISION MAKING: Stable/uncomplicated  EVALUATION COMPLEXITY: Low   GOALS: Goals reviewed with patient? Yes  SHORT TERM GOALS: (target date for Short term goals are 3 weeks 06/08/2023)   1.  Patient will demonstrate independent use of home exercise program to maintain progress from in clinic treatments.  Goal status: Met   LONG TERM GOALS: (target dates for all long term goals are 10 weeks  07/27/2023 )   1. Patient will demonstrate/report pain at worst less than or equal to 2/10 to facilitate minimal limitation in daily activity secondary to pain symptoms.  Goal status: New   2. Patient will demonstrate independent use of home exercise program to facilitate ability to maintain/progress functional gains from skilled physical therapy services.  Goal status: on going 06/12/2023   3. Patient will demonstrate FOTO outcome > or = (update from testing on 2nd visit) % to indicate reduced disability due to condition.  Goal status: on going 06/12/2023   4.  Patient will demonstrate Lt  LE MMT 5/5 throughout to faciltiate usual transfers, stairs, squatting at [facility] for daily life.   Goal status: on going 06/12/2023   5.  Patient will demonstrate/report ability to ascend/descend stairs s UE assist for entry to house and in community.  Goal status: on going 06/12/2023   6.  Patient will demonstrate/report ability to perform sit to stand to sit s deviation s UE assist form 18 inch chair to indicate reduced fall risk.  Goal status: on going 06/12/2023   7.  Patient will demonstrate SLS testing bilateral > 15 seconds for stability in ambulation on even and uneven surfaces.  Goal Status: on going 06/12/2023   PLAN:  PT FREQUENCY: 1-2x/week  PT DURATION: 10 weeks  PLANNED INTERVENTIONS: Therapeutic exercises, Therapeutic activity, Neuro Muscular re-education, Balance training, Gait training, Patient/Family education, Joint  mobilization, Stair training, DME instructions, Dry Needling, Electrical stimulation, Traction, Cryotherapy, vasopneumatic deviceMoist heat, Taping, Ultrasound, Ionotophoresis 4mg /ml Dexamethasone, and aquatic therapy, Manual therapy.  All included unless contraindicated  PLAN FOR NEXT SESSION: Compliant surface balance.  Recheck strength with dynamometry for extension.    Chyrel Masson, PT, DPT, OCS, ATC 06/15/23  10:00 AM

## 2023-07-04 ENCOUNTER — Encounter: Payer: Medicare Other | Admitting: Rehabilitative and Restorative Service Providers"

## 2023-07-11 ENCOUNTER — Ambulatory Visit: Payer: Medicare Other | Admitting: Rehabilitative and Restorative Service Providers"

## 2023-07-11 ENCOUNTER — Encounter: Payer: Self-pay | Admitting: Rehabilitative and Restorative Service Providers"

## 2023-07-11 DIAGNOSIS — M6281 Muscle weakness (generalized): Secondary | ICD-10-CM

## 2023-07-11 DIAGNOSIS — G8929 Other chronic pain: Secondary | ICD-10-CM | POA: Diagnosis not present

## 2023-07-11 DIAGNOSIS — R262 Difficulty in walking, not elsewhere classified: Secondary | ICD-10-CM

## 2023-07-11 DIAGNOSIS — M25562 Pain in left knee: Secondary | ICD-10-CM | POA: Diagnosis not present

## 2023-07-11 NOTE — Therapy (Addendum)
OUTPATIENT PHYSICAL THERAPY TREATMENT / DISCHARGE   Patient Name: [name] MRN: [id number] DOB:1941-08-01, 82 y.o., female Today's Date: 07/11/2023  END OF SESSION:  PT End of Session - 07/11/23 0920     Visit Number 4    Number of Visits 20    Date for PT Re-Evaluation 07/27/23    Authorization Type [employer] $10 copay    Progress Note Due on Visit 10    PT Start Time 0929    PT Stop Time 1008    PT Time Calculation (min) 39 min    Activity Tolerance Patient tolerated treatment well    Behavior During Therapy WFL for tasks assessed/performed                Past Medical History:  Diagnosis Date   Anxiety    Chronic low back pain    Complication of anesthesia    pt stated  "i do not like general anesthesia , it made me feel loopy for two weeks"  pt stated this happended with appendecotmy in 2010   DDD (degenerative disc disease), lumbosacral    Depression    GERD (gastroesophageal reflux disease)    History of kidney stones 1988   x1 episode passed on own   History of nonmelanoma skin cancer    SCC/ BCC   s/p  several excision of both   Hypertension    OA (osteoarthritis)    Osteopenia    Vulvar intraepithelial neoplasia (VIN) grade 3    Wears glasses    Wears hearing aid in both ears    Past Surgical History:  Procedure Laterality Date   BREAST EXCISIONAL BIOPSY Left 1961   benign   CATARACT EXTRACTION W/ INTRAOCULAR LENS IMPLANT Bilateral 2020   LAPAROSCOPIC ABDOMINAL EXPLORATION  12/15/2008   @[facility]  by dr gerkin;   ABORTED TO EXPLORATORY LAPAROTOMY W/ APPENDECTOMY (RUPUTED)   TOTAL HIP ARTHROPLASTY  04/19/2012   Procedure: TOTAL HIP ARTHROPLASTY ANTERIOR APPROACH;  Surgeon: Kathryne Hitch, MD;  Location: [facility];  Service: Orthopedics;  Laterality: Left;  Left Total Hip Arthroplasty   TOTAL HIP ARTHROPLASTY Right 04/21/2016   Procedure: RIGHT TOTAL HIP ARTHROPLASTY ANTERIOR APPROACH;  Surgeon: Kathryne Hitch, MD;  Location: [facility];  Service:  Orthopedics;  Laterality: Right;   TOTAL KNEE ARTHROPLASTY Right 12/20/2018   Procedure: RIGHT TOTAL KNEE ARTHROPLASTY;  Surgeon: Kathryne Hitch, MD;  Location: [facility];  Service: Orthopedics;  Laterality: Right;   VULVECTOMY N/A 07/25/2022   Procedure: Jeannene Patella;  Surgeon: Clide Cliff, MD;  Location: [facility];  Service: Gynecology;  Laterality: N/A;   Patient Active Problem List   Diagnosis Date Noted   Vulvar intraepithelial neoplasia (VIN) grade 3    Status post total right knee replacement 12/20/2018   Chronic pain of left knee 09/06/2017   Unilateral primary osteoarthritis, left knee 09/06/2017   Chronic pain of right knee 09/06/2017   Leg length difference, acquired 03/06/2017   Low back pain 03/06/2017   Unilateral primary osteoarthritis, right knee 12/06/2016   Status post total replacement of right hip 04/21/2016   Osteoarthritis of left thumb 10/15/2015   Degenerative arthritis of hip 04/19/2012   Anxiety    Cancer (HCC)    Depression    Arthritis    RENAL CALCULUS, RIGHT 12/15/2008   ABDOMINAL PAIN, UNSPECIFIED SITE 12/15/2008   NEPHROLITHIASIS, HX OF 12/15/2008   ANXIETY DEPRESSION 10/29/2007   CONTACT DERMATITIS DUE TO SOLAR RADIATION 10/29/2007   OSTEOARTHROSIS, LOCAL, PRIMARY,  UNSPC SITE 08/16/2007    PCP: Thana Ates MD  REFERRING PROVIDER: Kathryne Hitch, MD  REFERRING DIAG: [phone] (ICD-10-CM) - Unilateral primary osteoarthritis, left knee M25.562 (ICD-10-CM) - Left knee pain, unspecified chronicity  THERAPY DIAG:  Chronic pain of left knee  Muscle weakness (generalized)  Difficulty in walking, not elsewhere classified  Rationale for Evaluation and Treatment: Rehabilitation  ONSET DATE: approx 1-2 years.   SUBJECTIVE:   SUBJECTIVE STATEMENT: Pt indicated she has been walking and doing more steps.  Reported going to the [facility] and using leg press and doing exercise since last visit.    Pt indicated having a  bout of pain after last time.   Global rating of change indicated at +5 quite a bit better.   PERTINENT HISTORY: ONG:EXBM Rt THA now with Rt leg longer and wears lift in Lt shoe 04/21/16, anx,backpain,dep,HTN,osteopenia.  Rt TKA 12/20/18  PAIN:  NPRS scale: at worst 2/10 Pain location: Lt knee  Pain description: stiffness, sharp/achy Aggravating factors: stairs, squatting, uneven surface walking Relieving factors: gel injection  PRECAUTIONS: None  WEIGHT BEARING RESTRICTIONS: No  FALLS:  Has patient fallen in last 6 months? 2 times with bruised ribs and Rt arm.  Pt reported having no fear of falling.   LIVING ENVIRONMENT: Lives in: House/apartment Stairs: getting inside 3-5 with rail on Rt side  OCCUPATION: Retired but work part time standing/walking required.   PLOF: Independent , walking, yard work ,hiking, previously Chartered certified accountant.  [employer] membership  PATIENT GOALS: Reduce pain, get more steady   OBJECTIVE:   PATIENT SURVEYS:  No FOTO (computer issues on first visit)  COGNITION: 05/18/2023 Overall cognitive status: WFL    SENSATION: 05/18/2023 WFL  EDEMA:  05/18/2023 No specific observed edema Lt knee   MUSCLE LENGTH: 05/18/2023 No specific testing  PALPATION: 05/18/2023 No specific tenderness indicated.   LOWER EXTREMITY ROM:   ROM Right 05/18/2023 Left 05/18/2023  Hip flexion    Hip extension    Hip abduction    Hip adduction    Hip internal rotation    Hip external rotation    Knee flexion  128 AROM c discomfort in supine heel slide   Knee extension 0 AROM in seated LAQ 0 AROM in seated LAQ  Ankle dorsiflexion    Ankle plantarflexion    Ankle inversion    Ankle eversion     (Blank rows = not tested)  LOWER EXTREMITY MMT:  MMT Right 05/18/2023 Left 05/18/2023 Left 06/12/2023 Left 07/11/2023  Hip flexion 5/5 5/5    Hip extension      Hip abduction      Hip adduction      Hip internal rotation      Hip external rotation      Knee flexion  5/5 5/5    Knee extension 5/5 4/5 4+/5 5/5  Ankle dorsiflexion 5/5 5/5    Ankle plantarflexion      Ankle inversion      Ankle eversion       (Blank rows = not tested)  LOWER EXTREMITY SPECIAL TESTS:  05/18/2023 No specific testing today.   FUNCTIONAL TESTS:  07/11/2023:  Lt SLS: 15 seconds Rt SLS: 15 seconds  05/18/2023 18 inch chair transfer:  s UE on 1st try with mild weight shift to Rt  Lt SLS:  8 seconds Rt SLS: 5 seconds Tandem stance limited in control < 5 seconds s assist bilaterally  GAIT: 05/18/2023 Independent ambulation.  Shoe lift adaptation on Lt shoe.  TODAY'S TREATMENT                                                                          DATE: 07/11/2023 Therex: Nustep lvl 6 12 mins UE/LE Leg press double leg 75 lbs x 15, single leg x 15 bilateral 31 lbs  Seated machine leg extension trial 5 lbs with crepitus noted so advised not preforming at gym if painful.   Extended time in review of HEP plan going forward with cues for techniques and frequency use.  Question and answer time given with patient for improved understanding.   Neuro Re-ed Tandem stance 30 seconds bilateral c cues for home use SLS 30 sec attempts with occasional HHA x 1 bilateral c cues for home use   TODAY'S TREATMENT                                                                          DATE: 06/15/2023 Therex: Nustep lvl 6 10 mins UE/LE Leg press double leg 75 lbs x 15, single leg 2 x 15 bilateral 31 lbs  Sit to stand to sit 18 inch chair s UE assist slowly  x 10   Additional time spent in review of gym cues and exercise adjustements   Neuro Re-ed Heel toe lifts alternating x 20 slow movement focus  Tandem stance 1 min x 1 bilateral c occasional HHA   TODAY'S TREATMENT                                                                           DATE: 06/12/2023 Therex:  Nustep lvl 5 10 mins UE/LE Leg press double leg 75 lbs x 15, single leg x 15 bilateral Leg extension machine double leg up, single leg lowering 5 lbs x5.  Instruction for isometric loading in positioning with higher weight set on machine to avoid crepitus in patellar movement Seated Lt knee flexion machine 15 lbs x 10  Cues given on adjustments on gym equipment, pain monitoring and adjustment of weight/techniques based  off those symptoms.    Neuro Re-ed Review of tandem stance activity Heel toe lifts alternating x 20    TODAY'S TREATMENT                                                                          DATE:05/18/2023 Therex:    HEP instruction/performance c cues for techniques, handout provided.  Trial  set performed of each for comprehension and symptom assessment.  See below for exercise list  Recumbent bike 5.5 mins lvl 2 c cues for use at gym.   PATIENT EDUCATION:  07/11/2023 Education details: HEP update, gym educaiton Person educated: Patient Education method: Explanation, Demonstration, Verbal cues, and Handouts Education comprehension: verbalized understanding, returned demonstration, and verbal cues required  HOME EXERCISE PROGRAM: Access Code: [email or web address] URL: [email or web address] Date: 07/11/2023 Prepared by: Chyrel Masson  Exercises - Sit to Stand  - 3 x daily - 7 x weekly - 1 sets - 10 reps - Seated Long Arc Quad  - 3-5 x daily - 7 x weekly - 1-2 sets - 10 reps - 2 hold - Seated Quad Set (Mirrored)  - 3-5 x daily - 7 x weekly - 1 sets - 10 reps - 5 hold - Seated Straight Leg Heel Taps  - 1-2 x daily - 7 x weekly - 3 sets - 10 reps - Tandem Stance  - 1 x daily - 7 x weekly - 1 sets - 3 reps - 30 hold - Heel Toe Raises with Counter Support  - 1 x daily - 7 x weekly - 2-3 sets - 10 reps - Single Leg Stance  - 1 x daily - 7 x weekly - 1 sets - 3 reps - 30 hold  ASSESSMENT:  CLINICAL  IMPRESSION: Pt has attended 4 visits overall with good symptom reduction noted and improved confidence in activity during the day and use of gym and HEP.  Due to general improvements and confidence, recommended trial HEP period.  Pt was in agreement.   OBJECTIVE IMPAIRMENTS: decreased activity tolerance, decreased balance, decreased coordination, decreased endurance, decreased mobility, difficulty walking, decreased ROM, decreased strength, increased fascial restrictions, impaired perceived functional ability, impaired flexibility, improper body mechanics, and pain.   ACTIVITY LIMITATIONS: carrying, lifting, bending, standing, squatting, stairs, transfers, and locomotion level  PARTICIPATION LIMITATIONS: cleaning, laundry, interpersonal relationship, shopping, community activity, occupation, and yard work  PERSONAL FACTORS:  NGE:XBMW Rt THA now with Rt leg longer and wears lift in Lt shoe 04/21/16, anx,backpain,dep,HTN,osteopenia  are also affecting patient's functional outcome.   REHAB POTENTIAL: Good  CLINICAL DECISION MAKING: Stable/uncomplicated  EVALUATION COMPLEXITY: Low   GOALS: Goals reviewed with patient? Yes  SHORT TERM GOALS: (target date for Short term goals are 3 weeks 06/08/2023)   1.  Patient will demonstrate independent use of home exercise program to maintain progress from in clinic treatments.  Goal status: Met   LONG TERM GOALS: (target dates for all long term goals are 10 weeks  07/27/2023 )   1. Patient will demonstrate/report pain at worst less than or equal to 2/10 to facilitate minimal limitation in daily activity secondary to pain symptoms.  Goal status: met    2. Patient will demonstrate independent use of home exercise program to facilitate ability to maintain/progress functional gains from skilled physical therapy services.  Goal status: met    3. Patient will demonstrate FOTO outcome > or = (update from testing on 2nd visit) % to indicate reduced  disability due to condition.  Goal status: no available testing due to technology issues on 1st day   4.  Patient will demonstrate Lt  LE MMT 5/5 throughout to faciltiate usual transfers, stairs, squatting at [facility] for daily life.   Goal status: met    5.  Patient will demonstrate/report ability to ascend/descend stairs s UE assist for entry to house and in community.  Goal status: partially met  6.  Patient will demonstrate/report ability to perform sit to stand to sit s deviation s UE assist form 18 inch chair to indicate reduced fall risk.  Goal status: met   7.  Patient will demonstrate SLS testing bilateral > 15 seconds for stability in ambulation on even and uneven surfaces.  Goal Status:met    PLAN:  PT FREQUENCY: 1-2x/week  PT DURATION: 10 weeks  PLANNED INTERVENTIONS: Therapeutic exercises, Therapeutic activity, Neuro Muscular re-education, Balance training, Gait training, Patient/Family education, Joint mobilization, Stair training, DME instructions, Dry Needling, Electrical stimulation, Traction, Cryotherapy, vasopneumatic deviceMoist heat, Taping, Ultrasound, Ionotophoresis 4mg /ml Dexamethasone, and aquatic therapy, Manual therapy.  All included unless contraindicated  PLAN FOR NEXT SESSION:  Trial HEP. Discharge after 30 days inactivity.    Chyrel Masson, PT, DPT, OCS, ATC 07/11/23  10:10 AM   PHYSICAL THERAPY DISCHARGE SUMMARY  Visits from Start of Care: 4  Current functional level related to goals / functional outcomes: See note   Remaining deficits: See note   Education / Equipment: HEP  Patient goals were met. Patient is being discharged due to not returning since the last visit.  Chyrel Masson, PT, DPT, OCS, ATC 08/27/23  3:18 PM

## 2023-08-18 DIAGNOSIS — Z23 Encounter for immunization: Secondary | ICD-10-CM | POA: Diagnosis not present

## 2023-08-21 DIAGNOSIS — I10 Essential (primary) hypertension: Secondary | ICD-10-CM | POA: Diagnosis not present

## 2023-08-21 DIAGNOSIS — Z0000 Encounter for general adult medical examination without abnormal findings: Secondary | ICD-10-CM | POA: Diagnosis not present

## 2023-08-21 DIAGNOSIS — H35033 Hypertensive retinopathy, bilateral: Secondary | ICD-10-CM | POA: Diagnosis not present

## 2023-08-21 DIAGNOSIS — H353131 Nonexudative age-related macular degeneration, bilateral, early dry stage: Secondary | ICD-10-CM | POA: Diagnosis not present

## 2023-08-21 DIAGNOSIS — K080 Exfoliation of teeth due to systemic causes: Secondary | ICD-10-CM | POA: Diagnosis not present

## 2023-08-21 DIAGNOSIS — F3341 Major depressive disorder, recurrent, in partial remission: Secondary | ICD-10-CM | POA: Diagnosis not present

## 2023-08-21 DIAGNOSIS — Z1331 Encounter for screening for depression: Secondary | ICD-10-CM | POA: Diagnosis not present

## 2023-08-21 DIAGNOSIS — Z23 Encounter for immunization: Secondary | ICD-10-CM | POA: Diagnosis not present

## 2023-08-21 DIAGNOSIS — R7303 Prediabetes: Secondary | ICD-10-CM | POA: Diagnosis not present

## 2023-08-27 ENCOUNTER — Inpatient Hospital Stay: Payer: Medicare Other | Attending: Psychiatry | Admitting: Psychiatry

## 2023-08-27 ENCOUNTER — Encounter: Payer: Self-pay | Admitting: Psychiatry

## 2023-08-27 VITALS — BP 138/76 | HR 70 | Temp 97.6°F | Resp 19 | Wt 158.2 lb

## 2023-08-27 DIAGNOSIS — Z9079 Acquired absence of other genital organ(s): Secondary | ICD-10-CM | POA: Insufficient documentation

## 2023-08-27 DIAGNOSIS — Z86002 Personal history of in-situ neoplasm of other and unspecified genital organs: Secondary | ICD-10-CM | POA: Insufficient documentation

## 2023-08-27 DIAGNOSIS — D071 Carcinoma in situ of vulva: Secondary | ICD-10-CM

## 2023-08-27 NOTE — Patient Instructions (Signed)
It was a pleasure to see you in clinic today. - Normal exam today - If itching, you can try the clobetasol ointment/cream. But okay to hold off on the estrogen. - Return visit planned for 6 months  Thank you very much for allowing me to provide care for you today.  I appreciate your confidence in choosing our Gynecologic Oncology team at [facility].  If you have any questions about your visit today please call our office or send [place] a MyChart message and we will get back to you as soon as possible.

## 2023-08-27 NOTE — Progress Notes (Signed)
Gynecologic Oncology Return Clinic Visit  Date of Service: 08/27/2023 Referring Provider: Lavonna Monarch, FNP  Assessment & Plan: [name] is a 82 y.o. 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  VIN3 - NED on exam today  - Signs/symptoms of recurrence reviewed. - Given that pt is generally asymptomatic, okay to not refill estrogen at this time. Advised she can use steroid cream PRN if needed for itching. - Recommend continued q23mo surveillance exams x2 years then annually.  RTC 6 months.  Clide Cliff, MD Gynecologic Oncology   Medical Decision Making I personally spent  TOTAL 20 minutes face-to-face and non-face-to-face in the care of this patient, which includes all pre, intra, and post visit time on the date of service.  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  She was using Premarin twice weekly internally and externally, and was also prescribed clobetasol ointment for the lesion. Given persistent symptoms, lesion,  on 07/05/2022 she underwent an exam which noted a white plaque at the fourchette tender to palpation, and a biopsy was obtained, which resulted with VIN 3. Pt underwent a simple partial vulvectomy on 07/25/22 with final pathology showing VIN3, margins positive for VIN1 but negative for VIN3.  Interval History: Patient reports that she is overall doing well.  Notes occasional itching of her vulva but nothing that is persistent.  No particular spot or lesions that she has noted.  She has been out of the estrogen cream for about a month.  She has not been using the steroid cream.  Denies any new bleeding or lesions.   Past Medical/Surgical History: Past Medical History:  Diagnosis Date   Anxiety    Chronic low back pain     Complication of anesthesia    pt stated  "i do not like general anesthesia , it made me feel loopy for two weeks"  pt stated this happended with appendecotmy in 2010   DDD (degenerative disc disease), lumbosacral    Depression    GERD (gastroesophageal reflux disease)    History of kidney stones 1988   x1 episode passed on own   History of nonmelanoma skin cancer    SCC/ BCC   s/p  several excision of both   Hypertension    OA (osteoarthritis)    Osteopenia    Vulvar intraepithelial neoplasia (VIN) grade 3    Wears glasses    Wears hearing aid in both ears     Past Surgical History:  Procedure Laterality Date   BREAST EXCISIONAL BIOPSY Left 1961   benign   CATARACT EXTRACTION W/ INTRAOCULAR LENS IMPLANT Bilateral 2020   LAPAROSCOPIC ABDOMINAL EXPLORATION  12/15/2008   @[facility]  by dr gerkin;   ABORTED TO EXPLORATORY LAPAROTOMY W/ APPENDECTOMY (RUPUTED)   TOTAL HIP ARTHROPLASTY  04/19/2012   Procedure: TOTAL HIP ARTHROPLASTY ANTERIOR APPROACH;  Surgeon: Kathryne Hitch, MD;  Location: [facility];  Service: Orthopedics;  Laterality: Left;  Left Total Hip Arthroplasty   TOTAL HIP ARTHROPLASTY Right 04/21/2016   Procedure: RIGHT TOTAL HIP ARTHROPLASTY ANTERIOR APPROACH;  Surgeon: Kathryne Hitch, MD;  Location: [facility];  Service: Orthopedics;  Laterality: Right;   TOTAL KNEE ARTHROPLASTY Right 12/20/2018   Procedure: RIGHT TOTAL KNEE ARTHROPLASTY;  Surgeon: Kathryne Hitch, MD;  Location: [facility];  Service: Orthopedics;  Laterality: Right;   VULVECTOMY N/A 07/25/2022  Procedure: VULVECTOMY;  Surgeon: Clide Cliff, MD;  Location: [facility];  Service: Gynecology;  Laterality: N/A;    Family History  Problem Relation Age of Onset   Cancer Father        prostate   Hypertension Father    Heart attack Father    Cancer Brother        pancreatic/liver   Cancer Maternal Grandmother        throat   Breast cancer Paternal Grandmother    Ovarian cancer Neg  Hx    Uterine cancer Neg Hx     Social History   Socioeconomic History   Marital status: Divorced    Spouse name: Not on file   Number of children: Not on file   Years of education: Not on file   Highest education level: Not on file  Occupational History   Not on file  Tobacco Use   Smoking status: Former    Current packs/day: 0.00    Types: Cigarettes    Start date: 04/15/1957    Quit date: 04/15/1986    Years since quitting: 37.3   Smokeless tobacco: Never  Vaping Use   Vaping status: Never Used  Substance and Sexual Activity   Alcohol use: Yes    Comment: occasional   Drug use: Never   Sexual activity: Not on file  Other Topics Concern   Not on file  Social History Narrative   Not on file   Social Determinants of Health   Financial Resource Strain: Not on file  Food Insecurity: Not on file  Transportation Needs: Not on file  Physical Activity: Not on file  Stress: Not on file  Social Connections: Not on file    Current Medications:  Current Outpatient Medications:    acetaminophen (TYLENOL) 325 MG tablet, every 6 (six) hours as needed., Disp: , Rfl:    ALPRAZolam (XANAX) 0.25 MG tablet, Take 0.125 mg by mouth as needed for anxiety. , Disp: , Rfl:    amLODipine (NORVASC) 2.5 MG tablet, Take 2.5 mg by mouth at bedtime., Disp: , Rfl:    atorvastatin (LIPITOR) 10 MG tablet, Take 10 mg by mouth daily at 6 PM., Disp: , Rfl: 11   Calcium Carb-Cholecalciferol (CALCIUM + D3 PO), Take 1 tablet by mouth daily., Disp: , Rfl:    calcium carbonate (TUMS - DOSED IN MG ELEMENTAL CALCIUM) 500 MG chewable tablet, Chew 1 tablet by mouth 2 (two) times daily as needed for indigestion or heartburn., Disp: , Rfl:    citalopram (CELEXA) 40 MG tablet, Take 20 mg by mouth at bedtime. , Disp: , Rfl:    Difluprednate 0.05 % EMUL, Apply 1 drop to eye 3 (three) times daily as needed., Disp: , Rfl:    docusate sodium (COLACE) 100 MG capsule, Take 100 mg by mouth daily as needed for mild  constipation., Disp: , Rfl:    hydrochlorothiazide (HYDRODIURIL) 12.5 MG tablet, Take 12.5 mg by mouth every morning., Disp: , Rfl: 11   Multiple Vitamin (MULTIVITAMIN WITH MINERALS) TABS, Take 1 tablet by mouth daily., Disp: , Rfl:    Polyethyl Glycol-Propyl Glycol (SYSTANE OP), Place 1 drop into both eyes daily as needed (For dry eyes.). , Disp: , Rfl:    PREMARIN vaginal cream, Place vaginally 2 (two) times a week., Disp: , Rfl:   Review of Symptoms: Complete 10-system review is positive for: none  Physical Exam: BP 138/76 (Patient Position: Sitting)   Pulse 70   Temp 97.6 F (  36.4 C) (Oral)   Resp 19   Wt 158 lb 3.2 oz (71.8 kg)   LMP 10/31/1991   SpO2 94%   BMI 26.33 kg/m  General: Alert, oriented, no acute distress. HEENT: Normocephalic, atraumatic. Neck symmetric without masses. Sclera anicteric. Marland Kitchen Chest: Normal work of breathing. Clear to auscultation bilaterally.   Cardiovascular: Regular rate and rhythm, no murmurs. Abdomen: Soft, nontender.  Normoactive bowel sounds.  No masses appreciated.   Extremities: Grossly normal range of motion.  Warm, well perfused.  No edema bilaterally. Skin: No rashes or lesions noted. Lymphatics: No cervical, supraclavicular, or inguinal adenopathy. GU: External genitalia well-healed from prior posterior vulvectomy.  No new areas of concern.  Speculum exam reveals mildly atrophic vaginal mucosa normal-appearing cervix.  Bimanual exam with normal cervix and uterus. Exam chaperoned by Kimberly [place], CMA    Laboratory & Radiologic Studies: None

## 2023-09-12 DIAGNOSIS — H5213 Myopia, bilateral: Secondary | ICD-10-CM | POA: Diagnosis not present

## 2023-09-13 DIAGNOSIS — K080 Exfoliation of teeth due to systemic causes: Secondary | ICD-10-CM | POA: Diagnosis not present

## 2023-09-23 DIAGNOSIS — E663 Overweight: Secondary | ICD-10-CM | POA: Diagnosis not present

## 2023-09-23 DIAGNOSIS — L0291 Cutaneous abscess, unspecified: Secondary | ICD-10-CM | POA: Diagnosis not present

## 2023-10-03 ENCOUNTER — Ambulatory Visit: Payer: Medicare Other | Admitting: Orthopaedic Surgery

## 2023-10-25 DIAGNOSIS — J069 Acute upper respiratory infection, unspecified: Secondary | ICD-10-CM | POA: Diagnosis not present

## 2023-10-30 DIAGNOSIS — J40 Bronchitis, not specified as acute or chronic: Secondary | ICD-10-CM | POA: Diagnosis not present

## 2023-11-05 DIAGNOSIS — L578 Other skin changes due to chronic exposure to nonionizing radiation: Secondary | ICD-10-CM | POA: Diagnosis not present

## 2023-11-05 DIAGNOSIS — L814 Other melanin hyperpigmentation: Secondary | ICD-10-CM | POA: Diagnosis not present

## 2023-11-05 DIAGNOSIS — L821 Other seborrheic keratosis: Secondary | ICD-10-CM | POA: Diagnosis not present

## 2023-11-05 DIAGNOSIS — Z85828 Personal history of other malignant neoplasm of skin: Secondary | ICD-10-CM | POA: Diagnosis not present

## 2023-11-05 DIAGNOSIS — D485 Neoplasm of uncertain behavior of skin: Secondary | ICD-10-CM | POA: Diagnosis not present

## 2023-11-05 DIAGNOSIS — D0439 Carcinoma in situ of skin of other parts of face: Secondary | ICD-10-CM | POA: Diagnosis not present

## 2023-11-15 DIAGNOSIS — L820 Inflamed seborrheic keratosis: Secondary | ICD-10-CM | POA: Diagnosis not present

## 2023-11-15 DIAGNOSIS — L814 Other melanin hyperpigmentation: Secondary | ICD-10-CM | POA: Diagnosis not present

## 2023-11-15 DIAGNOSIS — D485 Neoplasm of uncertain behavior of skin: Secondary | ICD-10-CM | POA: Diagnosis not present

## 2023-11-15 DIAGNOSIS — L570 Actinic keratosis: Secondary | ICD-10-CM | POA: Diagnosis not present

## 2023-11-21 DIAGNOSIS — H26491 Other secondary cataract, right eye: Secondary | ICD-10-CM | POA: Diagnosis not present

## 2023-11-23 ENCOUNTER — Telehealth: Payer: Self-pay

## 2023-11-23 NOTE — Telephone Encounter (Signed)
VOB submitted for Euflexxa, left knee.

## 2023-11-26 ENCOUNTER — Encounter: Payer: Self-pay | Admitting: Orthopaedic Surgery

## 2023-11-26 ENCOUNTER — Other Ambulatory Visit: Payer: Self-pay

## 2023-11-26 ENCOUNTER — Ambulatory Visit: Payer: Medicare Other | Admitting: Orthopaedic Surgery

## 2023-11-26 DIAGNOSIS — M1712 Unilateral primary osteoarthritis, left knee: Secondary | ICD-10-CM | POA: Diagnosis not present

## 2023-11-26 MED ORDER — SODIUM HYALURONATE (VISCOSUP) 20 MG/2ML IX SOSY
20.0000 mg | PREFILLED_SYRINGE | INTRA_ARTICULAR | Status: AC | PRN
Start: 2023-11-26 — End: 2023-11-26
  Administered 2023-11-26: 20 mg via INTRA_ARTICULAR

## 2023-11-26 NOTE — Progress Notes (Signed)
The patient is well-known to [place].  She is 83 years old.  It has been 6 months since we saw her last.  She comes in today requesting hyaluronic acid for her left knee to treat the pain from osteoarthritis.  She is here for injection #1 of a series of 3 Euflexxa injections.  She has had this in the past.  She has had no acute change in her medical status.  She stays very active.  Examination of her left knee today shows no effusion.  She has good range of motion of that knee but global tenderness.  I did place Euflexxa No. 1 in her left knee today without difficulty.  Will see her back next week for injection #2 of a series of 3 injections for the left knee.  Lot number: [id number]     Procedure Note  Patient: [name]             Date of Birth: [birth year]           MRN: [id number]             Visit Date: 11/26/2023  Procedures: Visit Diagnoses:  1. Unilateral primary osteoarthritis, left knee     Large Joint Inj: L knee on 11/26/2023 8:46 AM Indications: diagnostic evaluation and pain Details: 22 G 1.5 in needle, superolateral approach  Arthrogram: No  Medications: 20 mg Sodium Hyaluronate (Viscosup) 20 MG/2ML Outcome: tolerated well, no immediate complications Procedure, treatment alternatives, risks and benefits explained, specific risks discussed. Consent was given by the patient. Immediately prior to procedure a time out was called to verify the correct patient, procedure, equipment, support staff and site/side marked as required. Patient was prepped and draped in the usual sterile fashion.

## 2023-11-28 DIAGNOSIS — H26492 Other secondary cataract, left eye: Secondary | ICD-10-CM | POA: Diagnosis not present

## 2023-12-03 ENCOUNTER — Ambulatory Visit: Payer: Medicare Other | Admitting: Orthopaedic Surgery

## 2023-12-03 DIAGNOSIS — M1712 Unilateral primary osteoarthritis, left knee: Secondary | ICD-10-CM | POA: Diagnosis not present

## 2023-12-03 MED ORDER — SODIUM HYALURONATE (VISCOSUP) 20 MG/2ML IX SOSY
20.0000 mg | PREFILLED_SYRINGE | INTRA_ARTICULAR | Status: AC | PRN
  Administered 2023-12-03: 20 mg via INTRA_ARTICULAR

## 2023-12-03 NOTE — Progress Notes (Signed)
   Procedure Note  Patient: [name]             Date of Birth: April 16, 1941           MRN: [id number]             Visit Date: 12/03/2023  Procedures: Visit Diagnoses:  1. Unilateral primary osteoarthritis, left knee     Large Joint Inj: L knee on 12/03/2023 8:44 AM Indications: diagnostic evaluation and pain Details: 22 G 1.5 in needle, superolateral approach  Arthrogram: No  Medications: 20 mg Sodium Hyaluronate (Viscosup) 20 MG/2ML Outcome: tolerated well, no immediate complications Procedure, treatment alternatives, risks and benefits explained, specific risks discussed. Consent was given by the patient. Immediately prior to procedure a time out was called to verify the correct patient, procedure, equipment, support staff and site/side marked as required. Patient was prepped and draped in the usual sterile fashion.    The patient is here for injection #2 of a series of 3 hyaluronic acid injections in her left knee to treat the pain for osteoarthritis.  This is with Euflexxa.  She has had no adverse reaction to injection #1.  I did place injection #2 of 3 injections in her left knee today without difficulty.  There was no effusion of her left knee and no warmth.  She tolerated this well.  Will see her back next week for injection #3.  Lot number: [id number]

## 2023-12-10 ENCOUNTER — Ambulatory Visit: Payer: Medicare Other | Admitting: Orthopaedic Surgery

## 2023-12-10 ENCOUNTER — Encounter: Payer: Self-pay | Admitting: Orthopaedic Surgery

## 2023-12-10 DIAGNOSIS — M1712 Unilateral primary osteoarthritis, left knee: Secondary | ICD-10-CM | POA: Diagnosis not present

## 2023-12-10 MED ORDER — SODIUM HYALURONATE (VISCOSUP) 20 MG/2ML IX SOSY
20.0000 mg | PREFILLED_SYRINGE | INTRA_ARTICULAR | Status: AC | PRN
  Administered 2023-12-10: 20 mg via INTRA_ARTICULAR

## 2023-12-10 NOTE — Progress Notes (Signed)
   Procedure Note  Patient: [name]             Date of Birth: 1940-12-08           MRN: [id number]             Visit Date: 12/10/2023  Procedures: Visit Diagnoses:  1. Unilateral primary osteoarthritis, left knee     Large Joint Inj: L knee on 12/10/2023 8:17 AM Indications: diagnostic evaluation and pain Details: 22 G 1.5 in needle, superolateral approach  Arthrogram: No  Medications: 20 mg Sodium Hyaluronate (Viscosup) 20 MG/2ML Outcome: tolerated well, no immediate complications Procedure, treatment alternatives, risks and benefits explained, specific risks discussed. Consent was given by the patient. Immediately prior to procedure a time out was called to verify the correct patient, procedure, equipment, support staff and site/side marked as required. Patient was prepped and draped in the usual sterile fashion.    The patient comes in today for injection #3 of a series of 3 Euflexxa injections to treat the pain from known osteoarthritis of the left knee.  She has had no adverse reaction to the first 2 injections.  There is no knee joint effusion today and no other issues.  She has recently turned 83 years old.  She did tolerate injection #3 today without difficulty.  She knows to wait at least 6 months minimum between these types of injections.  She is traveling to [place] in November and may consider an injection before then.

## 2023-12-31 DIAGNOSIS — L570 Actinic keratosis: Secondary | ICD-10-CM | POA: Diagnosis not present

## 2024-01-10 ENCOUNTER — Encounter: Payer: Self-pay | Admitting: Nurse Practitioner

## 2024-01-12 ENCOUNTER — Emergency Department (HOSPITAL_BASED_OUTPATIENT_CLINIC_OR_DEPARTMENT_OTHER)
Admission: EM | Admit: 2024-01-12 | Discharge: 2024-01-12 | Disposition: A | Discharge status: Home / Self Care | Attending: Emergency Medicine | Admitting: Emergency Medicine

## 2024-01-12 ENCOUNTER — Other Ambulatory Visit: Payer: Self-pay

## 2024-01-12 ENCOUNTER — Encounter (HOSPITAL_BASED_OUTPATIENT_CLINIC_OR_DEPARTMENT_OTHER): Payer: Self-pay | Admitting: Emergency Medicine

## 2024-01-12 ENCOUNTER — Emergency Department (HOSPITAL_BASED_OUTPATIENT_CLINIC_OR_DEPARTMENT_OTHER)

## 2024-01-12 DIAGNOSIS — J210 Acute bronchiolitis due to respiratory syncytial virus: Secondary | ICD-10-CM | POA: Diagnosis not present

## 2024-01-12 DIAGNOSIS — Z87891 Personal history of nicotine dependence: Secondary | ICD-10-CM | POA: Diagnosis not present

## 2024-01-12 DIAGNOSIS — I700 Atherosclerosis of aorta: Secondary | ICD-10-CM | POA: Diagnosis not present

## 2024-01-12 DIAGNOSIS — R059 Cough, unspecified: Secondary | ICD-10-CM | POA: Insufficient documentation

## 2024-01-12 DIAGNOSIS — R0602 Shortness of breath: Secondary | ICD-10-CM | POA: Diagnosis not present

## 2024-01-12 DIAGNOSIS — B974 Respiratory syncytial virus as the cause of diseases classified elsewhere: Secondary | ICD-10-CM | POA: Insufficient documentation

## 2024-01-12 LAB — RESP PANEL BY RT-PCR (RSV, FLU A&B, COVID)  RVPGX2
Influenza A by PCR: NEGATIVE
Influenza B by PCR: NEGATIVE
Resp Syncytial Virus by PCR: POSITIVE — AB
SARS Coronavirus 2 by RT PCR: NEGATIVE

## 2024-01-12 MED ORDER — BENZONATATE 100 MG PO CAPS
100.0000 mg | ORAL_CAPSULE | Freq: Once | ORAL | Status: AC
  Administered 2024-01-12: 100 mg via ORAL
  Filled 2024-01-12: qty 1

## 2024-01-12 MED ORDER — BENZONATATE 100 MG PO CAPS: 100.0000 mg | ORAL_CAPSULE | Freq: Three times a day (TID) | ORAL | 0 refills | Status: DC

## 2024-01-12 MED ORDER — PREDNISONE 20 MG PO TABS: 20.0000 mg | ORAL_TABLET | Freq: Every day | ORAL | 0 refills | Status: AC

## 2024-01-12 MED ORDER — PREDNISONE 20 MG PO TABS
20.0000 mg | ORAL_TABLET | Freq: Once | ORAL | Status: AC
  Administered 2024-01-12: 20 mg via ORAL
  Filled 2024-01-12: qty 1

## 2024-01-12 MED ORDER — ALBUTEROL SULFATE HFA 108 (90 BASE) MCG/ACT IN AERS
2.0000 | INHALATION_SPRAY | Freq: Once | RESPIRATORY_TRACT | Status: AC
  Administered 2024-01-12: 2 via RESPIRATORY_TRACT
  Filled 2024-01-12: qty 6.7

## 2024-01-12 NOTE — ED Provider Notes (Signed)
 [place] EMERGENCY DEPARTMENT AT [facility] Provider Note   CSN: [id number] Arrival date & time: 01/12/24  7829     History Chief Complaint  Patient presents with   Cough    HPI [name] is a 83 y.o. female presenting for fever cough congestion for 3 days.  Acutely worsened tonight. Has been diagnosed with bronchitis 3 times in the last 4 months. History of smoking but quit 30 years ago. Endorses some wheezing and shortness of breath worsening today. Otherwise ambulatory tolerating p.o. intake.   Patient's recorded medical, surgical, social, medication list and allergies were reviewed in the Snapshot window as part of the initial history.   Review of Systems   Review of Systems  Constitutional:  Negative for chills and fever.  HENT:  Positive for congestion and sore throat. Negative for ear pain.   Eyes:  Negative for pain and visual disturbance.  Respiratory:  Positive for cough. Negative for choking and shortness of breath.   Cardiovascular:  Negative for chest pain and palpitations.  Gastrointestinal:  Negative for abdominal pain and vomiting.  Genitourinary:  Negative for dysuria and hematuria.  Musculoskeletal:  Negative for arthralgias and back pain.  Skin:  Negative for color change and rash.  Neurological:  Negative for seizures and syncope.  All other systems reviewed and are negative.   Physical Exam Updated Vital Signs BP (!) 151/74 (BP Location: Right Arm)   Pulse 81   Temp 100 F (37.8 C) (Oral)   Resp 18   LMP 10/31/1991   SpO2 100%  Physical Exam Vitals and nursing note reviewed.  Constitutional:      General: She is not in acute distress.    Appearance: She is well-developed.  HENT:     Head: Normocephalic and atraumatic.  Eyes:     Conjunctiva/sclera: Conjunctivae normal.  Cardiovascular:     Rate and Rhythm: Normal rate and regular rhythm.     Heart sounds: No murmur heard. Pulmonary:     Effort: Pulmonary effort is  normal. No respiratory distress.     Breath sounds: Normal breath sounds.  Abdominal:     Palpations: Abdomen is soft.     Tenderness: There is no abdominal tenderness.  Musculoskeletal:        General: No swelling.     Cervical back: Neck supple.  Skin:    General: Skin is warm and dry.     Capillary Refill: Capillary refill takes less than 2 seconds.  Neurological:     Mental Status: She is alert.  Psychiatric:        Mood and Affect: Mood normal.      ED Course/ Medical Decision Making/ A&P    Procedures Procedures   Medications Ordered in ED Medications  albuterol (VENTOLIN HFA) 108 (90 Base) MCG/ACT inhaler 2 puff (2 puffs Inhalation Given 01/12/24 2012)  benzonatate (TESSALON) capsule 100 mg (100 mg Oral Given 01/12/24 2007)  predniSONE (DELTASONE) tablet 20 mg (20 mg Oral Given 01/12/24 2007)   Medical Decision Making:   [name] is a 83 y.o. female who presented to the ED today with subjective fever, cough, congestion detailed above.    Patient placed on continuous vitals and telemetry monitoring while in ED which was reviewed periodically.   Complete initial physical exam performed, notably the patient  was hemodynamically stable in no acute distress.  Posterior oropharynx illuminated and without obvious swelling or deformity.  Patient is without neck stiffness.    Reviewed and  confirmed nursing documentation for past medical history, family history, social history.    Initial Assessment:   With the patient's presentation of fever cough congestion, most likely diagnosis is developing viral upper respiratory infection. Other diagnoses were considered including (but not limited to) peritonsillar abscess, retropharyngeal abscess, pneumonia. These are considered less likely due to history of present illness and physical exam findings.   This is most consistent with an acute life/limb threatening illness complicated by underlying chronic conditions. Considered  meningitis, however patient's symptoms, vital signs, physical exam findings including lack of meningismus seem grossly less consistent at this time. Initial Plan:  Screening labs including CBC and Metabolic panel to evaluate for infectious or metabolic etiology of disease.  Viral screening including COVID/flu testing to evaluate for common viral etiologies that need to be tracked CXR to evaluate for structural/infectious intrathoracic pathology.  Empiric treatment with antipyretics including acetaminophen in ambulatory setting Objective evaluation as below reviewed   Initial Study Results:   Laboratory  All laboratory results reviewed without evidence of clinically relevant pathology.   Radiology:  All images reviewed independently. Agree with radiology report at this time.   DG Chest Portable 1 View  Final Result         Final Assessment and Plan:   On reassessment, patient is ambulatory tolerating p.o. intake in no acute distress.   Patient's COVID test is negative.  Positive for RSV.  Discussed supportive care and management patient in agreement.   Patient is currently stable for outpatient care and management with no indication for hospitalization or transfer at this time.  Discussed all findings with patient expressed understanding.  Disposition:  Based on the above findings, I believe patient is stable for discharge.    Patient/family educated about specific return precautions for given chief complaint and symptoms.  Patient/family educated about follow-up with PCP.     Patient/family expressed understanding of return precautions and need for follow-up. Patient spoken to regarding all imaging and laboratory results and appropriate follow up for these results. All education provided in verbal form with additional information in written form. Time was allowed for answering of patient questions. Patient discharged.    Emergency Department Medication Summary:   Medications   albuterol (VENTOLIN HFA) 108 (90 Base) MCG/ACT inhaler 2 puff (2 puffs Inhalation Given 01/12/24 2012)  benzonatate (TESSALON) capsule 100 mg (100 mg Oral Given 01/12/24 2007)  predniSONE (DELTASONE) tablet 20 mg (20 mg Oral Given 01/12/24 2007)           Clinical Impression:  1. RSV (acute bronchiolitis due to respiratory syncytial virus)      Discharge   Clinical Impression:  1. RSV (acute bronchiolitis due to respiratory syncytial virus)      Discharge   Final Clinical Impression(s) / ED Diagnoses Final diagnoses:  RSV (acute bronchiolitis due to respiratory syncytial virus)    Rx / DC Orders ED Discharge Orders          Ordered    benzonatate (TESSALON) 100 MG capsule  Every 8 hours        01/12/24 1953    predniSONE (DELTASONE) 20 MG tablet  Daily        01/12/24 1953              Glyn Ade, MD 01/12/24 2028

## 2024-01-12 NOTE — ED Triage Notes (Signed)
 Cough and congestion x 3 days Worse last night

## 2024-01-16 DIAGNOSIS — J210 Acute bronchiolitis due to respiratory syncytial virus: Secondary | ICD-10-CM | POA: Diagnosis not present

## 2024-01-29 DIAGNOSIS — L989 Disorder of the skin and subcutaneous tissue, unspecified: Secondary | ICD-10-CM | POA: Diagnosis not present

## 2024-02-20 DIAGNOSIS — L821 Other seborrheic keratosis: Secondary | ICD-10-CM | POA: Diagnosis not present

## 2024-02-20 DIAGNOSIS — E7800 Pure hypercholesterolemia, unspecified: Secondary | ICD-10-CM | POA: Diagnosis not present

## 2024-02-20 DIAGNOSIS — F3341 Major depressive disorder, recurrent, in partial remission: Secondary | ICD-10-CM | POA: Diagnosis not present

## 2024-02-20 DIAGNOSIS — C44729 Squamous cell carcinoma of skin of left lower limb, including hip: Secondary | ICD-10-CM | POA: Diagnosis not present

## 2024-02-20 DIAGNOSIS — L570 Actinic keratosis: Secondary | ICD-10-CM | POA: Diagnosis not present

## 2024-02-20 DIAGNOSIS — I10 Essential (primary) hypertension: Secondary | ICD-10-CM | POA: Diagnosis not present

## 2024-02-20 DIAGNOSIS — R7303 Prediabetes: Secondary | ICD-10-CM | POA: Diagnosis not present

## 2024-02-20 DIAGNOSIS — D485 Neoplasm of uncertain behavior of skin: Secondary | ICD-10-CM | POA: Diagnosis not present

## 2024-02-25 ENCOUNTER — Inpatient Hospital Stay: Payer: Medicare Other | Attending: Psychiatry | Admitting: Psychiatry

## 2024-02-25 ENCOUNTER — Encounter: Payer: Self-pay | Admitting: Psychiatry

## 2024-02-25 VITALS — BP 117/60 | HR 67 | Temp 98.0°F | Resp 16 | Ht 65.35 in | Wt 156.8 lb

## 2024-02-25 DIAGNOSIS — Z9079 Acquired absence of other genital organ(s): Secondary | ICD-10-CM | POA: Diagnosis not present

## 2024-02-25 DIAGNOSIS — D071 Carcinoma in situ of vulva: Secondary | ICD-10-CM

## 2024-02-25 DIAGNOSIS — Z86002 Personal history of in-situ neoplasm of other and unspecified genital organs: Secondary | ICD-10-CM | POA: Diagnosis not present

## 2024-02-25 NOTE — Progress Notes (Signed)
 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  VIN3 - NED on exam today  - Signs/symptoms of recurrence reviewed. - Given that pt is generally asymptomatic, okay to not refill estrogen at this time. Advised she can use steroid cream PRN if needed for itching. - Recommend continued q30mo surveillance exams x2 years then annually.  RTC 6 months.  Derrel Flies, MD Gynecologic Oncology   Medical Decision Making I personally spent  TOTAL 15 minutes face-to-face and non-face-to-face in the care of this patient, which includes all pre, intra, and post visit time on the date of service.  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  She was using Premarin twice weekly internally and externally, and was also prescribed clobetasol ointment for the lesion. Given persistent symptoms, lesion,  on 07/05/2022 she underwent an exam which noted a white plaque at the fourchette tender to palpation, and a biopsy was obtained, which resulted with VIN 3. Pt underwent a simple partial vulvectomy on 07/25/22 with final pathology showing VIN3, margins positive for VIN1 but negative for VIN3.  Interval History: Overall currently doing well.  Was recently diagnosed with RSV but has resolved.  Following with dermatology for lesions on her legs.  Denies any vulvar bleeding or new lesions.  Has occasional itching and no particular spot, nothing persistent.  Not needing steroid cream at this time.    Past Medical/Surgical History: Past Medical History:  Diagnosis Date   Anxiety    Chronic low back pain    Complication of anesthesia     pt stated  "i do not like general anesthesia , it made me feel loopy for two weeks"  pt stated this happended with appendecotmy in 2010   DDD (degenerative disc disease), lumbosacral    Depression    GERD (gastroesophageal reflux disease)    History of kidney stones 1988   x1 episode passed on own   History of nonmelanoma skin cancer    SCC/ BCC   s/p  several excision of both   Hypertension    OA (osteoarthritis)    Osteopenia    Vulvar intraepithelial neoplasia (VIN) grade 3    Wears glasses    Wears hearing aid in both ears     Past Surgical History:  Procedure Laterality Date   BREAST EXCISIONAL BIOPSY Left 1961   benign   CATARACT EXTRACTION W/ INTRAOCULAR LENS IMPLANT Bilateral 2020   LAPAROSCOPIC ABDOMINAL EXPLORATION  12/15/2008   @[facility]  by dr gerkin;   ABORTED TO EXPLORATORY LAPAROTOMY W/ APPENDECTOMY (RUPUTED)   TOTAL HIP ARTHROPLASTY  04/19/2012   Procedure: TOTAL HIP ARTHROPLASTY ANTERIOR APPROACH;  Surgeon: Arnie Lao, MD;  Location: [facility];  Service: Orthopedics;  Laterality: Left;  Left Total Hip Arthroplasty   TOTAL HIP ARTHROPLASTY Right 04/21/2016   Procedure: RIGHT TOTAL HIP ARTHROPLASTY ANTERIOR APPROACH;  Surgeon: Arnie Lao, MD;  Location: [facility];  Service: Orthopedics;  Laterality: Right;   TOTAL KNEE ARTHROPLASTY Right 12/20/2018   Procedure: RIGHT TOTAL KNEE ARTHROPLASTY;  Surgeon: Arnie Lao, MD;  Location: [facility];  Service: Orthopedics;  Laterality: Right;   VULVECTOMY N/A 07/25/2022   Procedure: Dannielle Dux;  Surgeon: Derrel Flies, MD;  Location: [facility];  Service: Gynecology;  Laterality: N/A;    Family History  Problem Relation Age of Onset   Cancer Father        prostate   Hypertension Father    Heart attack Father    Cancer Brother        pancreatic/liver   Cancer Maternal Grandmother        throat   Breast cancer Paternal Grandmother    Ovarian cancer Neg Hx    Uterine cancer Neg  Hx     Social History   Socioeconomic History   Marital status: Divorced    Spouse name: Not on file   Number of children: Not on file   Years of education: Not on file   Highest education level: Not on file  Occupational History   Not on file  Tobacco Use   Smoking status: Former    Current packs/day: 0.00    Types: Cigarettes    Start date: 04/15/1957    Quit date: 04/15/1986    Years since quitting: 37.8   Smokeless tobacco: Never  Vaping Use   Vaping status: Never Used  Substance and Sexual Activity   Alcohol use: Yes    Comment: occasional   Drug use: Never   Sexual activity: Not on file  Other Topics Concern   Not on file  Social History Narrative   Not on file   Social Drivers of Health   Financial Resource Strain: Not on file  Food Insecurity: Not on file  Transportation Needs: Not on file  Physical Activity: Not on file  Stress: Not on file  Social Connections: Not on file    Current Medications:  Current Outpatient Medications:    acetaminophen  (TYLENOL ) 325 MG tablet, every 6 (six) hours as needed., Disp: , Rfl:    ALPRAZolam  (XANAX ) 0.25 MG tablet, Take 0.125 mg by mouth as needed for anxiety. , Disp: , Rfl:    amLODipine (NORVASC) 2.5 MG tablet, Take 2.5 mg by mouth at bedtime., Disp: , Rfl:    atorvastatin  (LIPITOR) 10 MG tablet, Take 10 mg by mouth daily at 6 PM., Disp: , Rfl: 11   benzonatate  (TESSALON ) 100 MG capsule, Take 1 capsule (100 mg total) by mouth every 8 (eight) hours., Disp: 21 capsule, Rfl: 0   Calcium  Carb-Cholecalciferol (CALCIUM  + D3 PO), Take 1 tablet by mouth daily., Disp: , Rfl:    calcium  carbonate (TUMS - DOSED IN MG ELEMENTAL CALCIUM ) 500 MG chewable tablet, Chew 1 tablet by mouth 2 (two) times daily as needed for indigestion or heartburn., Disp: , Rfl:    citalopram  (CELEXA ) 40 MG tablet, Take 20 mg by mouth at bedtime. , Disp: , Rfl:    Difluprednate 0.05 % EMUL, Apply 1 drop to eye 3 (three) times daily as needed., Disp: ,  Rfl:    docusate sodium  (COLACE) 100 MG capsule, Take 100 mg by mouth daily as needed for mild constipation., Disp: , Rfl:    hydrochlorothiazide  (HYDRODIURIL ) 12.5 MG tablet, Take 12.5 mg by mouth every morning., Disp: , Rfl: 11   Multiple Vitamin (MULTIVITAMIN WITH MINERALS) TABS, Take 1 tablet by mouth daily., Disp: , Rfl:    Polyethyl Glycol-Propyl Glycol (SYSTANE OP), Place 1 drop into both eyes daily as needed (For dry eyes.). , Disp: , Rfl:    PREMARIN vaginal cream, Place vaginally 2 (two) times a week., Disp: , Rfl:   Review of Symptoms: Complete 10-system review is positive for: none  Physical Exam: BP 117/60 (BP Location: Left Arm, Patient Position: Sitting)   Pulse 67   Temp 98 F (36.7 C) (Oral)   Resp 16   Ht 5' 5.35" (1.66 m)   Wt 156 lb 12.8 oz (71.1 kg)   LMP 10/31/1991   SpO2 97%   BMI 25.81 kg/m  General: Alert, oriented, no acute distress. HEENT: Normocephalic, atraumatic. Neck symmetric without masses. Sclera anicteric. Aaron Aas Chest: Normal work of breathing.  Cardiovascular: Regular rate  Abdomen: Soft, nontender.   Extremities: Grossly normal range of motion.  Warm, well perfused.   Skin: Lesions on bilateral lower extremities being addressed by dermatologist. Lymphatics: No cervical, supraclavicular, or inguinal adenopathy. GU: External genitalia well-healed from prior posterior vulvectomy.  No new areas of concern.  Speculum exam reveals mildly atrophic vaginal mucosa normal-appearing cervix, small endocervical polyp.  Bimanual exam with normal cervix and uterus. Exam chaperoned by Vira Grieves, NP     Laboratory & Radiologic Studies: None

## 2024-02-25 NOTE — Addendum Note (Signed)
 Addended by: Arieal Cuoco B on: 02/25/2024 01:53 PM   Modules accepted: Orders

## 2024-02-25 NOTE — Patient Instructions (Signed)
 It was a pleasure to see you in clinic today. - Reassuring exam today - Return visit planned for 58mo - But if any new concerns for bleeding or lesions on the vulva, let us  know.  Thank you very much for allowing me to provide care for you today.  I appreciate your confidence in choosing our Gynecologic Oncology team at [facility].  If you have any questions about your visit today please call our office or send us  a MyChart message and we will get back to you as soon as possible.

## 2024-03-04 DIAGNOSIS — C44729 Squamous cell carcinoma of skin of left lower limb, including hip: Secondary | ICD-10-CM | POA: Diagnosis not present

## 2024-03-27 DIAGNOSIS — K080 Exfoliation of teeth due to systemic causes: Secondary | ICD-10-CM | POA: Diagnosis not present

## 2024-04-07 ENCOUNTER — Other Ambulatory Visit (INDEPENDENT_AMBULATORY_CARE_PROVIDER_SITE_OTHER): Payer: Self-pay

## 2024-04-07 ENCOUNTER — Ambulatory Visit: Admitting: Orthopaedic Surgery

## 2024-04-07 DIAGNOSIS — M25562 Pain in left knee: Secondary | ICD-10-CM

## 2024-04-07 DIAGNOSIS — G8929 Other chronic pain: Secondary | ICD-10-CM | POA: Diagnosis not present

## 2024-04-07 DIAGNOSIS — M1712 Unilateral primary osteoarthritis, left knee: Secondary | ICD-10-CM | POA: Diagnosis not present

## 2024-04-07 NOTE — Progress Notes (Signed)
 The patient is well-known to us .  She has a history of a hip replacement as well as a right knee replacement.  Her left knee has been seen for some time now.  She is very active 83 year old female.  She last had hyaluronic acid injections with Euflexxa in the left knee back in February.  That knee has been bothering her quite a bit recently.  Examination of the left knee she has valgus malalignment with significant tenderness along the lateral joint line throughout the arc of motion of her knee and to palpation.  There is also patellofemoral crepitation.  2 views left knee on the standing view show valgus malalignment left knee with bone-on-bone wear of the lateral compartment.  The lateral view also shows the same.  The AP view does show her right knee standing with a well-seated right total knee arthroplasty.  We talked about considering a steroid injection in her left knee but she would like to wait until later in July about a week prior to a walking and hiking trip in the [place].  We will see her in July for steroid injection in her left knee and then can see about ordering hyaluronic acid for the left knee for sometime later in August because that has helped with the long-term osteoarthritis pain.  She agrees with the treatment plan.

## 2024-04-08 DIAGNOSIS — L814 Other melanin hyperpigmentation: Secondary | ICD-10-CM | POA: Diagnosis not present

## 2024-04-08 DIAGNOSIS — T8131XA Disruption of external operation (surgical) wound, not elsewhere classified, initial encounter: Secondary | ICD-10-CM | POA: Diagnosis not present

## 2024-04-08 DIAGNOSIS — D0439 Carcinoma in situ of skin of other parts of face: Secondary | ICD-10-CM | POA: Diagnosis not present

## 2024-04-10 DIAGNOSIS — D071 Carcinoma in situ of vulva: Secondary | ICD-10-CM | POA: Diagnosis not present

## 2024-04-10 DIAGNOSIS — L309 Dermatitis, unspecified: Secondary | ICD-10-CM | POA: Diagnosis not present

## 2024-04-11 DIAGNOSIS — J40 Bronchitis, not specified as acute or chronic: Secondary | ICD-10-CM | POA: Diagnosis not present

## 2024-04-11 DIAGNOSIS — H35033 Hypertensive retinopathy, bilateral: Secondary | ICD-10-CM | POA: Diagnosis not present

## 2024-04-11 DIAGNOSIS — I10 Essential (primary) hypertension: Secondary | ICD-10-CM | POA: Diagnosis not present

## 2024-04-18 ENCOUNTER — Ambulatory Visit: Admitting: Podiatry

## 2024-04-25 ENCOUNTER — Ambulatory Visit (INDEPENDENT_AMBULATORY_CARE_PROVIDER_SITE_OTHER)

## 2024-04-25 ENCOUNTER — Ambulatory Visit: Admitting: Podiatry

## 2024-04-25 DIAGNOSIS — M216X2 Other acquired deformities of left foot: Secondary | ICD-10-CM | POA: Diagnosis not present

## 2024-04-25 DIAGNOSIS — M7752 Other enthesopathy of left foot: Secondary | ICD-10-CM

## 2024-04-25 DIAGNOSIS — L84 Corns and callosities: Secondary | ICD-10-CM

## 2024-04-25 DIAGNOSIS — M19072 Primary osteoarthritis, left ankle and foot: Secondary | ICD-10-CM | POA: Diagnosis not present

## 2024-04-25 DIAGNOSIS — M2041 Other hammer toe(s) (acquired), right foot: Secondary | ICD-10-CM

## 2024-04-25 NOTE — Progress Notes (Unsigned)
 Subjective:   Patient ID: [name], female   DOB: 83 y.o.   MRN: [id number]   HPI Chief Complaint  Patient presents with   Foot Pain    RM#13 Patient presents today with left foot pain on the outside of her foot has been using mole skin inside shoe and not helping. Ongoing pain for months now.   83 year old female presents the office today with concerns of pain to the lateral aspect of her left foot.  She has been trying to use moleskin which has not been helping.  This been ongoing for the last several months.  She does not recall any injuries.  No open lesions.  No other treatment.   Review of Systems  All other systems reviewed and are negative.   Past Medical History:  Diagnosis Date   Anxiety    Chronic low back pain    Complication of anesthesia    pt stated  i do not like general anesthesia , it made me feel loopy for two weeks  pt stated this happended with appendecotmy in 2010   DDD (degenerative disc disease), lumbosacral    Depression    GERD (gastroesophageal reflux disease)    History of kidney stones 1988   x1 episode passed on own   History of nonmelanoma skin cancer    SCC/ BCC   s/p  several excision of both   Hypertension    OA (osteoarthritis)    Osteopenia    Vulvar intraepithelial neoplasia (VIN) grade 3    Wears glasses    Wears hearing aid in both ears     Past Surgical History:  Procedure Laterality Date   BREAST EXCISIONAL BIOPSY Left 1961   benign   CATARACT EXTRACTION W/ INTRAOCULAR LENS IMPLANT Bilateral 2020   LAPAROSCOPIC ABDOMINAL EXPLORATION  12/15/2008   @[facility]  by dr gerkin;   ABORTED TO EXPLORATORY LAPAROTOMY W/ APPENDECTOMY (RUPUTED)   TOTAL HIP ARTHROPLASTY  04/19/2012   Procedure: TOTAL HIP ARTHROPLASTY ANTERIOR APPROACH;  Surgeon: Lonni CINDERELLA Poli, MD;  Location: [facility];  Service: Orthopedics;  Laterality: Left;  Left Total Hip Arthroplasty   TOTAL HIP ARTHROPLASTY Right 04/21/2016   Procedure: RIGHT TOTAL HIP ARTHROPLASTY  ANTERIOR APPROACH;  Surgeon: Lonni CINDERELLA Poli, MD;  Location: [facility];  Service: Orthopedics;  Laterality: Right;   TOTAL KNEE ARTHROPLASTY Right 12/20/2018   Procedure: RIGHT TOTAL KNEE ARTHROPLASTY;  Surgeon: Poli Lonni CINDERELLA, MD;  Location: [facility];  Service: Orthopedics;  Laterality: Right;   VULVECTOMY N/A 07/25/2022   Procedure: LYNDLE;  Surgeon: Eldonna Mays, MD;  Location: [facility];  Service: Gynecology;  Laterality: N/A;     Current Outpatient Medications:    ALPRAZolam  (XANAX ) 0.25 MG tablet, Take 0.125 mg by mouth as needed for anxiety. , Disp: , Rfl:    amLODipine (NORVASC) 2.5 MG tablet, Take 2.5 mg by mouth at bedtime., Disp: , Rfl:    atorvastatin  (LIPITOR) 10 MG tablet, Take 10 mg by mouth daily at 6 PM., Disp: , Rfl: 11   Calcium  Carb-Cholecalciferol (CALCIUM  + D3 PO), Take 1 tablet by mouth daily., Disp: , Rfl:    calcium  carbonate (TUMS - DOSED IN MG ELEMENTAL CALCIUM ) 500 MG chewable tablet, Chew 1 tablet by mouth 2 (two) times daily as needed for indigestion or heartburn., Disp: , Rfl:    citalopram  (CELEXA ) 40 MG tablet, Take 20 mg by mouth at bedtime. , Disp: , Rfl:    Difluprednate 0.05 % EMUL, Apply 1 drop  to eye 3 (three) times daily as needed., Disp: , Rfl:    docusate sodium  (COLACE) 100 MG capsule, Take 100 mg by mouth daily as needed for mild constipation., Disp: , Rfl:    hydrochlorothiazide  (HYDRODIURIL ) 12.5 MG tablet, Take 12.5 mg by mouth every morning., Disp: , Rfl: 11   Multiple Vitamin (MULTIVITAMIN WITH MINERALS) TABS, Take 1 tablet by mouth daily., Disp: , Rfl:    Polyethyl Glycol-Propyl Glycol (SYSTANE OP), Place 1 drop into both eyes daily as needed (For dry eyes.). , Disp: , Rfl:   Allergies  Allergen Reactions   Synvisc [Hylan G-F 20] Swelling    Joint swelling, redness   Adhesive [Tape] Itching and Rash         Objective:  Physical Exam  General: AAO x3, NAD  Dermatological: Hyperkeratotic lesion left  submetatarsal 5 without any underlying ulceration, drainage or any signs of infection.  No open lesions identified bilaterally.  Vascular: Dorsalis Pedis artery and Posterior Tibial artery pedal pulses are 2/4 bilateral with immedate capillary fill time.  There is no pain with calf compression, swelling, warmth, erythema.   Neruologic: Grossly intact via light touch bilateral.   Musculoskeletal: Unable to appreciate any area pinpoint tenderness.  She has point tenderness on the hyperkeratotic lesion.  There is no edema, erythema.  MMT 5/5.  Prominence of metatarsal head noted submetatarsal 5.  Decreased range of motion of the first MTPJ.     Assessment:   Hyperkeratotic lesion, present metatarsal head     Plan:  -Treatment options discussed including all alternatives, risks, and complications -Etiology of symptoms were discussed -X-rays were obtained and reviewed with the patient.  3 views left foot were obtained.  There is no evidence of acute fracture.  Digital contractures present.  Arthritic changes are present at the first MTPJ as well as the midfoot.  Calcaneal spurring is present. -I sharply debrided the hyperkeratotic lesion any complications or bleeding.  Discussed moisturizer, offloading.  Dispensed offloading pads.  Discussed change in shoes to avoid excess pressure.  Return if symptoms worsen or fail to improve.  [name] DPM

## 2024-04-28 DIAGNOSIS — Z85828 Personal history of other malignant neoplasm of skin: Secondary | ICD-10-CM | POA: Diagnosis not present

## 2024-04-28 DIAGNOSIS — E7800 Pure hypercholesterolemia, unspecified: Secondary | ICD-10-CM | POA: Diagnosis not present

## 2024-04-28 DIAGNOSIS — L814 Other melanin hyperpigmentation: Secondary | ICD-10-CM | POA: Diagnosis not present

## 2024-04-28 DIAGNOSIS — L578 Other skin changes due to chronic exposure to nonionizing radiation: Secondary | ICD-10-CM | POA: Diagnosis not present

## 2024-04-28 DIAGNOSIS — L570 Actinic keratosis: Secondary | ICD-10-CM | POA: Diagnosis not present

## 2024-04-28 DIAGNOSIS — L821 Other seborrheic keratosis: Secondary | ICD-10-CM | POA: Diagnosis not present

## 2024-04-28 DIAGNOSIS — I10 Essential (primary) hypertension: Secondary | ICD-10-CM | POA: Diagnosis not present

## 2024-04-28 DIAGNOSIS — H35033 Hypertensive retinopathy, bilateral: Secondary | ICD-10-CM | POA: Diagnosis not present

## 2024-04-28 DIAGNOSIS — F3341 Major depressive disorder, recurrent, in partial remission: Secondary | ICD-10-CM | POA: Diagnosis not present

## 2024-04-28 DIAGNOSIS — J40 Bronchitis, not specified as acute or chronic: Secondary | ICD-10-CM | POA: Diagnosis not present

## 2024-04-30 DIAGNOSIS — F3341 Major depressive disorder, recurrent, in partial remission: Secondary | ICD-10-CM | POA: Diagnosis not present

## 2024-04-30 DIAGNOSIS — Z87891 Personal history of nicotine dependence: Secondary | ICD-10-CM | POA: Diagnosis not present

## 2024-05-01 DIAGNOSIS — H353111 Nonexudative age-related macular degeneration, right eye, early dry stage: Secondary | ICD-10-CM | POA: Diagnosis not present

## 2024-05-01 DIAGNOSIS — H43811 Vitreous degeneration, right eye: Secondary | ICD-10-CM | POA: Diagnosis not present

## 2024-05-09 ENCOUNTER — Other Ambulatory Visit: Payer: Self-pay | Admitting: Internal Medicine

## 2024-05-09 DIAGNOSIS — Z1231 Encounter for screening mammogram for malignant neoplasm of breast: Secondary | ICD-10-CM

## 2024-05-10 DIAGNOSIS — J40 Bronchitis, not specified as acute or chronic: Secondary | ICD-10-CM | POA: Diagnosis not present

## 2024-05-10 DIAGNOSIS — H35033 Hypertensive retinopathy, bilateral: Secondary | ICD-10-CM | POA: Diagnosis not present

## 2024-05-10 DIAGNOSIS — I10 Essential (primary) hypertension: Secondary | ICD-10-CM | POA: Diagnosis not present

## 2024-05-21 ENCOUNTER — Ambulatory Visit
Admission: RE | Admit: 2024-05-21 | Discharge: 2024-05-21 | Disposition: A | Discharge status: Home / Self Care | Source: Ambulatory Visit | Attending: Internal Medicine | Admitting: Internal Medicine

## 2024-05-21 DIAGNOSIS — Z1231 Encounter for screening mammogram for malignant neoplasm of breast: Secondary | ICD-10-CM

## 2024-05-26 ENCOUNTER — Ambulatory Visit: Admitting: Orthopaedic Surgery

## 2024-05-26 ENCOUNTER — Telehealth: Payer: Self-pay

## 2024-05-26 ENCOUNTER — Encounter: Payer: Self-pay | Admitting: Orthopaedic Surgery

## 2024-05-26 DIAGNOSIS — M1712 Unilateral primary osteoarthritis, left knee: Secondary | ICD-10-CM | POA: Diagnosis not present

## 2024-05-26 DIAGNOSIS — G8929 Other chronic pain: Secondary | ICD-10-CM | POA: Diagnosis not present

## 2024-05-26 DIAGNOSIS — M25562 Pain in left knee: Secondary | ICD-10-CM | POA: Diagnosis not present

## 2024-05-26 NOTE — Telephone Encounter (Signed)
 Left knee gel injection ?

## 2024-05-26 NOTE — Progress Notes (Signed)
 The patient comes in today requesting a steroid injection for her right left knee to treat the pain from osteoarthritis.  She has done very well with hyaluronic acid injections and she would like to consider hyaluronic acid injection to treat the long-term pain from osteoarthritis in her knee.  There will be 6 months in August at the time between hyaluronic acid injections into that so we can perform that injection.  She still travels quite a bit and is a very active 83 year old female.  Examination of her left knee today shows some valgus malalignment with mild effusion and pain throughout the arc of motion with known tricompartment arthritis.  Per request I did place a steroid injection in her left knee.  We will work on ordering hyaluronic acid for her left knee in August which will be the 6 months standpoint since her last hyaluronic acid for the left knee.  This patient is diagnosed with osteoarthritis of the knee(s).    Radiographs show evidence of joint space narrowing, osteophytes, subchondral sclerosis and/or subchondral cysts.  This patient has knee pain which interferes with functional and activities of daily living.    This patient has experienced inadequate response, adverse effects and/or intolerance with conservative treatments such as acetaminophen , NSAIDS, topical creams, physical therapy or regular exercise, knee bracing and/or weight loss.   This patient has experienced inadequate response or has a contraindication to intra articular steroid injections for at least 3 months.   This patient is not scheduled to have a total knee replacement within 6 months of starting treatment with viscosupplementation.

## 2024-05-29 DIAGNOSIS — J40 Bronchitis, not specified as acute or chronic: Secondary | ICD-10-CM | POA: Diagnosis not present

## 2024-05-29 DIAGNOSIS — H35033 Hypertensive retinopathy, bilateral: Secondary | ICD-10-CM | POA: Diagnosis not present

## 2024-05-29 DIAGNOSIS — F3341 Major depressive disorder, recurrent, in partial remission: Secondary | ICD-10-CM | POA: Diagnosis not present

## 2024-05-29 DIAGNOSIS — E7800 Pure hypercholesterolemia, unspecified: Secondary | ICD-10-CM | POA: Diagnosis not present

## 2024-05-29 DIAGNOSIS — I10 Essential (primary) hypertension: Secondary | ICD-10-CM | POA: Diagnosis not present

## 2024-06-04 NOTE — Telephone Encounter (Signed)
 VOB submitted for Euflexxa, left knee.

## 2024-06-09 DIAGNOSIS — I10 Essential (primary) hypertension: Secondary | ICD-10-CM | POA: Diagnosis not present

## 2024-06-09 DIAGNOSIS — H35033 Hypertensive retinopathy, bilateral: Secondary | ICD-10-CM | POA: Diagnosis not present

## 2024-06-09 DIAGNOSIS — J40 Bronchitis, not specified as acute or chronic: Secondary | ICD-10-CM | POA: Diagnosis not present

## 2024-06-12 ENCOUNTER — Telehealth: Payer: Self-pay

## 2024-06-12 NOTE — Telephone Encounter (Signed)
 PA submitted online through euflexxa portal. PA pending

## 2024-06-17 ENCOUNTER — Encounter: Payer: Self-pay | Admitting: Orthopaedic Surgery

## 2024-06-23 ENCOUNTER — Encounter: Payer: Self-pay | Admitting: Orthopaedic Surgery

## 2024-06-23 ENCOUNTER — Ambulatory Visit (INDEPENDENT_AMBULATORY_CARE_PROVIDER_SITE_OTHER): Admitting: Orthopaedic Surgery

## 2024-06-23 ENCOUNTER — Other Ambulatory Visit: Payer: Self-pay

## 2024-06-23 DIAGNOSIS — M1712 Unilateral primary osteoarthritis, left knee: Secondary | ICD-10-CM | POA: Diagnosis not present

## 2024-06-23 MED ORDER — SODIUM HYALURONATE (VISCOSUP) 20 MG/2ML IX SOSY
20.0000 mg | PREFILLED_SYRINGE | INTRA_ARTICULAR | Status: AC | PRN
  Administered 2024-06-23: 20 mg via INTRA_ARTICULAR

## 2024-06-23 NOTE — Progress Notes (Signed)
   Procedure Note  Patient: [name]             Date of Birth: 1941-07-21           MRN: [id number]             Visit Date: 06/23/2024  Procedures: Visit Diagnoses:  1. Unilateral primary osteoarthritis, left knee     Large Joint Inj: L knee on 06/23/2024 10:58 AM Indications: diagnostic evaluation and pain Details: 22 G 1.5 in needle, superolateral approach  Arthrogram: No  Medications: 20 mg Sodium Hyaluronate (Viscosup) 20 MG/2ML Outcome: tolerated well, no immediate complications Procedure, treatment alternatives, risks and benefits explained, specific risks discussed. Consent was given by the patient. Immediately prior to procedure a time out was called to verify the correct patient, procedure, equipment, support staff and site/side marked as required. Patient was prepped and draped in the usual sterile fashion.    The patient is here for injection #1 of a series of 3 hyaluronic acid injections with Euflexxa to treat the pain from osteoarthritis.  She has had these type of injections in the past.  She understands the risks and benefits of these type of injections to treat the pain from osteoarthritis.  She has failed other conservative treatments including steroid injections.  Injection #1 of Euflexxa was placed in her left knee today without difficulty.  Will see her back next week for injection #2 of a series of 3 injections.  Lot number [id number] Expiration: 10/12/2024

## 2024-06-29 DIAGNOSIS — F3341 Major depressive disorder, recurrent, in partial remission: Secondary | ICD-10-CM | POA: Diagnosis not present

## 2024-06-29 DIAGNOSIS — I10 Essential (primary) hypertension: Secondary | ICD-10-CM | POA: Diagnosis not present

## 2024-06-29 DIAGNOSIS — E7800 Pure hypercholesterolemia, unspecified: Secondary | ICD-10-CM | POA: Diagnosis not present

## 2024-06-29 DIAGNOSIS — H35033 Hypertensive retinopathy, bilateral: Secondary | ICD-10-CM | POA: Diagnosis not present

## 2024-06-29 DIAGNOSIS — J40 Bronchitis, not specified as acute or chronic: Secondary | ICD-10-CM | POA: Diagnosis not present

## 2024-07-02 ENCOUNTER — Ambulatory Visit: Admitting: Physician Assistant

## 2024-07-02 DIAGNOSIS — M1712 Unilateral primary osteoarthritis, left knee: Secondary | ICD-10-CM | POA: Diagnosis not present

## 2024-07-02 NOTE — Progress Notes (Signed)
   Procedure Note  Patient: [name]             Date of Birth: [birth year]           MRN: [id number]             Visit Date: 07/02/2024   HPI: [name] comes in today for second Euflexxa injection.  She states she did well with the first injection no problems.  Has not noticed a lot of relief.  No new injuries.  Physical exam: Left knee: No abnormal warmth erythema or effusion.  Good range of motion significant patellofemoral crepitus.  Slight valgus deformity. Procedures: Visit Diagnoses:  1. Unilateral primary osteoarthritis, left knee     Large Joint Inj on 07/02/2024 10:12 AM Indications: pain Details: 22 G 1.5 in needle, anterolateral approach  Arthrogram: No  Outcome: tolerated well, no immediate complications Procedure, treatment alternatives, risks and benefits explained, specific risks discussed. Consent was given by the patient. Immediately prior to procedure a time out was called to verify the correct patient, procedure, equipment, support staff and site/side marked as required. Patient was prepped and draped in the usual sterile fashion.     Plan: Will have her follow-up with us  in 1 week for her third and final Euflexxa injection.  Patient tolerated injection well.

## 2024-07-09 ENCOUNTER — Ambulatory Visit: Admitting: Physician Assistant

## 2024-07-09 DIAGNOSIS — M1712 Unilateral primary osteoarthritis, left knee: Secondary | ICD-10-CM

## 2024-07-09 DIAGNOSIS — J40 Bronchitis, not specified as acute or chronic: Secondary | ICD-10-CM | POA: Diagnosis not present

## 2024-07-09 DIAGNOSIS — I10 Essential (primary) hypertension: Secondary | ICD-10-CM | POA: Diagnosis not present

## 2024-07-09 DIAGNOSIS — H35033 Hypertensive retinopathy, bilateral: Secondary | ICD-10-CM | POA: Diagnosis not present

## 2024-07-09 MED ORDER — SODIUM HYALURONATE (VISCOSUP) 20 MG/2ML IX SOSY
20.0000 mg | PREFILLED_SYRINGE | INTRA_ARTICULAR | Status: AC | PRN
  Administered 2024-07-09: 20 mg via INTRA_ARTICULAR

## 2024-07-09 NOTE — Progress Notes (Signed)
   Procedure Note  Patient: [name]             Date of Birth: [birth year]           MRN: [id number]             Visit Date: 07/09/2024  HPI: Mrs. Treadway comes in today for her third Euflexxa injection left knee.  She has had no adverse effects to the injections but has had no relief as of yet.  Left knee: No abnormal warmth erythema.  Good range of motion of the knee. Procedure: Visit Diagnoses:  1. Unilateral primary osteoarthritis, left knee     Large Joint Inj on 07/09/2024 10:32 AM Indications: pain Details: 22 G 1.5 in needle, anterolateral approach  Arthrogram: No  Medications: 20 mg Sodium Hyaluronate (Viscosup) 20 MG/2ML Outcome: tolerated well, no immediate complications Procedure, treatment alternatives, risks and benefits explained, specific risks discussed. Consent was given by the patient. Immediately prior to procedure a time out was called to verify the correct patient, procedure, equipment, support staff and site/side marked as required. Patient was prepped and draped in the usual sterile fashion.     Plan: She will follow-up with us  as needed she knows to wait at least 6 months between viscosupplementation injections.

## 2024-07-12 ENCOUNTER — Other Ambulatory Visit (HOSPITAL_BASED_OUTPATIENT_CLINIC_OR_DEPARTMENT_OTHER): Payer: Self-pay

## 2024-07-12 MED ORDER — FLUZONE HIGH-DOSE 0.5 ML IM SUSY
0.5000 mL | PREFILLED_SYRINGE | Freq: Once | INTRAMUSCULAR | 0 refills | Status: AC
Start: 2024-07-12 — End: 2024-07-13
  Filled 2024-07-12: qty 0.5, 1d supply, fill #0

## 2024-07-21 ENCOUNTER — Other Ambulatory Visit (HOSPITAL_BASED_OUTPATIENT_CLINIC_OR_DEPARTMENT_OTHER): Payer: Self-pay

## 2024-07-21 MED ORDER — COMIRNATY 30 MCG/0.3ML IM SUSY
0.3000 mL | PREFILLED_SYRINGE | Freq: Once | INTRAMUSCULAR | 0 refills | Status: AC
  Filled 2024-07-21: qty 0.3, 1d supply, fill #0

## 2024-07-29 DIAGNOSIS — F3341 Major depressive disorder, recurrent, in partial remission: Secondary | ICD-10-CM | POA: Diagnosis not present

## 2024-07-29 DIAGNOSIS — J40 Bronchitis, not specified as acute or chronic: Secondary | ICD-10-CM | POA: Diagnosis not present

## 2024-07-29 DIAGNOSIS — I10 Essential (primary) hypertension: Secondary | ICD-10-CM | POA: Diagnosis not present

## 2024-07-29 DIAGNOSIS — H35033 Hypertensive retinopathy, bilateral: Secondary | ICD-10-CM | POA: Diagnosis not present

## 2024-07-29 DIAGNOSIS — E7800 Pure hypercholesterolemia, unspecified: Secondary | ICD-10-CM | POA: Diagnosis not present

## 2024-08-08 DIAGNOSIS — H35033 Hypertensive retinopathy, bilateral: Secondary | ICD-10-CM | POA: Diagnosis not present

## 2024-08-08 DIAGNOSIS — I10 Essential (primary) hypertension: Secondary | ICD-10-CM | POA: Diagnosis not present

## 2024-08-08 DIAGNOSIS — J40 Bronchitis, not specified as acute or chronic: Secondary | ICD-10-CM | POA: Diagnosis not present

## 2024-08-21 DIAGNOSIS — F3341 Major depressive disorder, recurrent, in partial remission: Secondary | ICD-10-CM | POA: Diagnosis not present

## 2024-08-21 DIAGNOSIS — I10 Essential (primary) hypertension: Secondary | ICD-10-CM | POA: Diagnosis not present

## 2024-08-21 DIAGNOSIS — H35033 Hypertensive retinopathy, bilateral: Secondary | ICD-10-CM | POA: Diagnosis not present

## 2024-08-21 DIAGNOSIS — N289 Disorder of kidney and ureter, unspecified: Secondary | ICD-10-CM | POA: Diagnosis not present

## 2024-08-25 ENCOUNTER — Ambulatory Visit: Admitting: Psychiatry

## 2024-08-29 DIAGNOSIS — E7800 Pure hypercholesterolemia, unspecified: Secondary | ICD-10-CM | POA: Diagnosis not present

## 2024-08-29 DIAGNOSIS — F3341 Major depressive disorder, recurrent, in partial remission: Secondary | ICD-10-CM | POA: Diagnosis not present

## 2024-08-29 DIAGNOSIS — J40 Bronchitis, not specified as acute or chronic: Secondary | ICD-10-CM | POA: Diagnosis not present

## 2024-08-29 DIAGNOSIS — I10 Essential (primary) hypertension: Secondary | ICD-10-CM | POA: Diagnosis not present

## 2024-08-29 DIAGNOSIS — H35033 Hypertensive retinopathy, bilateral: Secondary | ICD-10-CM | POA: Diagnosis not present

## 2024-09-01 ENCOUNTER — Encounter: Payer: Self-pay | Admitting: Radiology

## 2024-09-01 ENCOUNTER — Telehealth: Payer: Self-pay | Admitting: *Deleted

## 2024-09-01 ENCOUNTER — Inpatient Hospital Stay: Admitting: Psychiatry

## 2024-09-01 DIAGNOSIS — J019 Acute sinusitis, unspecified: Secondary | ICD-10-CM | POA: Diagnosis not present

## 2024-09-01 DIAGNOSIS — D071 Carcinoma in situ of vulva: Secondary | ICD-10-CM

## 2024-09-01 DIAGNOSIS — J069 Acute upper respiratory infection, unspecified: Secondary | ICD-10-CM | POA: Diagnosis not present

## 2024-09-01 NOTE — Telephone Encounter (Signed)
 Spoke with patient who called to reschedule her appt.today due to not feeling well with aches and cough. Pt was given a new appt. For Monday, December 8 th at 2:30 pm. Pt agreed to date and time and thanked the office.

## 2024-09-02 NOTE — Progress Notes (Signed)
 This encounter was created in error - please disregard (rescheduled due to illness).

## 2024-09-07 DIAGNOSIS — H35033 Hypertensive retinopathy, bilateral: Secondary | ICD-10-CM | POA: Diagnosis not present

## 2024-09-07 DIAGNOSIS — J40 Bronchitis, not specified as acute or chronic: Secondary | ICD-10-CM | POA: Diagnosis not present

## 2024-09-07 DIAGNOSIS — I10 Essential (primary) hypertension: Secondary | ICD-10-CM | POA: Diagnosis not present

## 2024-09-10 ENCOUNTER — Ambulatory Visit (INDEPENDENT_AMBULATORY_CARE_PROVIDER_SITE_OTHER): Admitting: Physician Assistant

## 2024-09-10 DIAGNOSIS — M1712 Unilateral primary osteoarthritis, left knee: Secondary | ICD-10-CM

## 2024-09-10 MED ORDER — METHYLPREDNISOLONE ACETATE 40 MG/ML IJ SUSP
40.0000 mg | INTRAMUSCULAR | Status: AC | PRN
  Administered 2024-09-10: 40 mg via INTRA_ARTICULAR

## 2024-09-10 MED ORDER — LIDOCAINE HCL 1 % IJ SOLN
3.0000 mL | INTRAMUSCULAR | Status: AC | PRN
  Administered 2024-09-10: 3 mL

## 2024-09-10 NOTE — Progress Notes (Signed)
   Procedure Note  Patient: [name]             Date of Birth: [birth year]           MRN: [id number]             Visit Date: 09/10/2024 HPI: Mrs. Alesi comes in today requesting cortisone injection left knee.  Last injection was a viscosupplementation injection on 07/09/2024 she states has been very beneficial.  However she does have an upcoming trip to [place] and wants a cortisone injection before going that she will be quite active.  She has had no injuries to the knee.  She denies any fevers chills.  She is nondiabetic.  Physical exam: General Well-developed well-nourished female no acute distress ambulates without any assistive device. Left knee: Good range of motion left knee no instability valgus varus stressing.  No abnormal warmth erythema or effusion.  Procedures: Visit Diagnoses:  1. Unilateral primary osteoarthritis, left knee     Large Joint Inj on 09/10/2024 8:40 AM Indications: pain Details: 22 G 1.5 in needle, anterolateral approach  Arthrogram: No  Medications: 3 mL lidocaine  1 %; 40 mg methylPREDNISolone  acetate 40 MG/ML Outcome: tolerated well, no immediate complications Procedure, treatment alternatives, risks and benefits explained, specific risks discussed. Consent was given by the patient. Immediately prior to procedure a time out was called to verify the correct patient, procedure, equipment, support staff and site/side marked as required. Patient was prepped and draped in the usual sterile fashion.      Plan: She will follow-up with us  as needed she knows to wait at least 3 months between cortisone injections and 6 months between viscosupplementation injections.  Questions were encouraged and answered.

## 2024-09-28 DIAGNOSIS — J40 Bronchitis, not specified as acute or chronic: Secondary | ICD-10-CM | POA: Diagnosis not present

## 2024-09-28 DIAGNOSIS — F3341 Major depressive disorder, recurrent, in partial remission: Secondary | ICD-10-CM | POA: Diagnosis not present

## 2024-09-28 DIAGNOSIS — E7800 Pure hypercholesterolemia, unspecified: Secondary | ICD-10-CM | POA: Diagnosis not present

## 2024-09-28 DIAGNOSIS — I10 Essential (primary) hypertension: Secondary | ICD-10-CM | POA: Diagnosis not present

## 2024-09-28 DIAGNOSIS — H35033 Hypertensive retinopathy, bilateral: Secondary | ICD-10-CM | POA: Diagnosis not present

## 2024-10-06 ENCOUNTER — Inpatient Hospital Stay: Attending: Psychiatry | Admitting: Psychiatry

## 2024-10-06 ENCOUNTER — Encounter: Payer: Self-pay | Admitting: Psychiatry

## 2024-10-06 VITALS — BP 134/69 | HR 78 | Temp 98.3°F | Resp 19 | Wt 156.6 lb

## 2024-10-06 DIAGNOSIS — Z86002 Personal history of in-situ neoplasm of other and unspecified genital organs: Secondary | ICD-10-CM | POA: Diagnosis not present

## 2024-10-06 DIAGNOSIS — Z9079 Acquired absence of other genital organ(s): Secondary | ICD-10-CM | POA: Insufficient documentation

## 2024-10-06 DIAGNOSIS — D071 Carcinoma in situ of vulva: Secondary | ICD-10-CM

## 2024-10-06 NOTE — Progress Notes (Signed)
 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  VIN3 - NED on exam today  - Signs/symptoms of recurrence reviewed. - Given that pt is generally asymptomatic, okay to not refill estrogen at this time. Advised she can use steroid cream PRN if needed for itching.  - Given now 2 yrs NED, okay to resume annual exams with her Ob/Gyn.   RTC prn  Hoy Masters, MD Gynecologic Oncology   Medical Decision Making I personally spent  TOTAL 15 minutes face-to-face and non-face-to-face in the care of this patient, which includes all pre, intra, and post visit time on the date of service.  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  She was using Premarin twice weekly internally and externally, and was also prescribed clobetasol ointment for the lesion. Given persistent symptoms, lesion,  on 07/05/2022 she underwent an exam which noted a white plaque at the fourchette tender to palpation, and a biopsy was obtained, which resulted with VIN 3. Pt underwent a simple partial vulvectomy on 07/25/22 with final pathology showing VIN3, margins positive for VIN1 but negative for VIN3.  Interval History: Overall doing well. Denies bleeding. Reports occasional bleeding but nothing persistent. Had an episode of white vaginal discharge that self resolved but did not recur. No lesions that she is aware of. Not needing to use the steroid cream.     Past Medical/Surgical History: Past Medical History:  Diagnosis Date   Anxiety    Chronic low back pain    Complication of anesthesia    pt stated  i do not like general anesthesia ,  it made me feel loopy for two weeks  pt stated this happended with appendecotmy in 2010   DDD (degenerative disc disease), lumbosacral    Depression    GERD (gastroesophageal reflux disease)    History of kidney stones 1988   x1 episode passed on own   History of nonmelanoma skin cancer    SCC/ BCC   s/p  several excision of both   Hypertension    OA (osteoarthritis)    Osteopenia    Vulvar intraepithelial neoplasia (VIN) grade 3    Wears glasses    Wears hearing aid in both ears     Past Surgical History:  Procedure Laterality Date   BREAST EXCISIONAL BIOPSY Left 1961   benign   BREAST LUMPECTOMY Left    CATARACT EXTRACTION W/ INTRAOCULAR LENS IMPLANT Bilateral 2020   LAPAROSCOPIC ABDOMINAL EXPLORATION  12/15/2008   @[facility]  by dr gerkin;   ABORTED TO EXPLORATORY LAPAROTOMY W/ APPENDECTOMY (RUPUTED)   TOTAL HIP ARTHROPLASTY  04/19/2012   Procedure: TOTAL HIP ARTHROPLASTY ANTERIOR APPROACH;  Surgeon: Lonni CINDERELLA Poli, MD;  Location: [facility];  Service: Orthopedics;  Laterality: Left;  Left Total Hip Arthroplasty   TOTAL HIP ARTHROPLASTY Right 04/21/2016   Procedure: RIGHT TOTAL HIP ARTHROPLASTY ANTERIOR APPROACH;  Surgeon: Lonni CINDERELLA Poli, MD;  Location: [facility];  Service: Orthopedics;  Laterality: Right;   TOTAL KNEE ARTHROPLASTY Right 12/20/2018   Procedure: RIGHT TOTAL KNEE ARTHROPLASTY;  Surgeon: Poli Lonni CINDERELLA, MD;  Location: [facility];  Service: Orthopedics;  Laterality: Right;   VULVECTOMY N/A 07/25/2022   Procedure: LYNDLE;  Surgeon: Masters,  Hoy, MD;  [facility]: [facility];  Service: Gynecology;  Laterality: N/A;    Family History  Problem Relation Age of Onset   Cancer Father        prostate   Hypertension Father    Heart attack Father    Cancer Brother        pancreatic/liver   Cancer Maternal Grandmother        throat   Breast cancer Paternal Grandmother    Ovarian cancer Neg Hx    Uterine cancer Neg Hx     Social History    Socioeconomic History   Marital status: Divorced    Spouse name: Not on file   Number of children: Not on file   Years of education: Not on file   Highest education level: Not on file  Occupational History   Not on file  Tobacco Use   Smoking status: Former    Current packs/day: 0.00    Types: Cigarettes    Start date: 04/15/1957    Quit date: 04/15/1986    Years since quitting: 38.5   Smokeless tobacco: Never  Vaping Use   Vaping status: Never Used  Substance and Sexual Activity   Alcohol use: Yes    Comment: occasional   Drug use: Never   Sexual activity: Not on file  Other Topics Concern   Not on file  Social History Narrative   Not on file   Social Drivers of Health   Financial Resource Strain: Not on file  Food Insecurity: Not on file  Transportation Needs: Not on file  Physical Activity: Not on file  Stress: Not on file  Social Connections: Not on file    Current Medications:  Current Outpatient Medications:    ALPRAZolam  (XANAX ) 0.25 MG tablet, Take 0.125 mg by mouth as needed for anxiety. , Disp: , Rfl:    amLODipine (NORVASC) 2.5 MG tablet, Take 2.5 mg by mouth at bedtime., Disp: , Rfl:    atorvastatin  (LIPITOR) 10 MG tablet, Take 10 mg by mouth daily at 6 PM., Disp: , Rfl: 11   Calcium  Carb-Cholecalciferol (CALCIUM  + D3 PO), Take 1 tablet by mouth daily., Disp: , Rfl:    calcium  carbonate (TUMS - DOSED IN MG ELEMENTAL CALCIUM ) 500 MG chewable tablet, Chew 1 tablet by mouth 2 (two) times daily as needed for indigestion or heartburn., Disp: , Rfl:    citalopram  (CELEXA ) 40 MG tablet, Take 20 mg by mouth at bedtime. , Disp: , Rfl:    docusate sodium  (COLACE) 100 MG capsule, Take 100 mg by mouth daily as needed for mild constipation., Disp: , Rfl:    hydrochlorothiazide  (HYDRODIURIL ) 12.5 MG tablet, Take 12.5 mg by mouth every morning., Disp: , Rfl: 11   Multiple Vitamin (MULTIVITAMIN WITH MINERALS) TABS, Take 1 tablet by mouth daily., Disp: , Rfl:     Polyethyl Glycol-Propyl Glycol (SYSTANE OP), Place 1 drop into both eyes daily as needed (For dry eyes.). , Disp: , Rfl:   Review of Symptoms: Complete 10-system review is positive for: Joint pain, anxiety, occasional depression  Physical Exam: BP 134/69 (BP Location: Right Arm, Patient Position: Sitting)   Pulse 78   Temp 98.3 F (36.8 C) (Oral)   Resp 19   Wt 156 lb 9.6 oz (71 kg)   LMP 10/31/1991   SpO2 99%   BMI 25.78 kg/m  General: Alert, oriented, no acute distress. HEENT: Normocephalic, atraumatic. Neck symmetric without masses. Sclera anicteric. SABRA Chest: Normal work of breathing.  Cardiovascular: Regular rate  Abdomen: Soft, nontender.   Extremities: Grossly normal range of motion.  Warm, well perfused.   Skin: Lesions on bilateral lower extremities being addressed by dermatologist. Lymphatics: No cervical, supraclavicular, or inguinal adenopathy. GU: External genitalia well-healed from prior posterior vulvectomy.  No new areas of concern.  Speculum exam reveals mildly atrophic vaginal mucosa normal-appearing cervix, small endocervical polyp.  . Exam chaperoned by Eleanor Epps, NP    Laboratory & Radiologic Studies: None

## 2024-10-06 NOTE — Patient Instructions (Signed)
 It was a pleasure to see you in clinic today. - Normal exam today - You are safe to return to annual exams with your Ob/Gyn  Thank you very much for allowing me to provide care for you today.  I appreciate your confidence in choosing our Gynecologic Oncology team at [facility].  If you have any questions about your visit today please call our office or send us  a MyChart message and we will get back to you as soon as possible.

## 2024-10-13 DIAGNOSIS — K080 Exfoliation of teeth due to systemic causes: Secondary | ICD-10-CM | POA: Diagnosis not present

## 2024-11-18 ENCOUNTER — Telehealth: Payer: Self-pay | Admitting: Orthopaedic Surgery

## 2024-11-18 NOTE — Telephone Encounter (Signed)
 Pt called saying that she needs a new lift in her left shoe from [facility]. Call back number is [phone].

## 2024-11-25 ENCOUNTER — Telehealth: Payer: Self-pay | Admitting: Orthopaedic Surgery

## 2024-11-25 NOTE — Telephone Encounter (Signed)
 Patient called. Says [facility] did not recive the RX for her shoe lits.

## 2024-12-05 ENCOUNTER — Ambulatory Visit

## 2024-12-05 ENCOUNTER — Ambulatory Visit: Admitting: Podiatry

## 2024-12-05 DIAGNOSIS — M7751 Other enthesopathy of right foot: Secondary | ICD-10-CM

## 2024-12-05 MED ORDER — DEXAMETHASONE SODIUM PHOSPHATE 120 MG/30ML IJ SOLN
4.0000 mg | Freq: Once | INTRAMUSCULAR | Status: AC
  Administered 2024-12-05: 4 mg via INTRA_ARTICULAR

## 2024-12-05 NOTE — Patient Instructions (Signed)

## 2024-12-05 NOTE — Progress Notes (Unsigned)
 Started one day at work  Thinks it sarthritis   Thinkts its arthritis Tylenol  and lidocaine  patch- helps a little
